# Patient Record
Sex: Female | Born: 1970
Health system: Southern US, Community
[De-identification: ages and names within clinical notes are randomized; demographics above are authoritative.]

## PROBLEM LIST (undated history)

## (undated) DIAGNOSIS — J45909 Unspecified asthma, uncomplicated: Secondary | ICD-10-CM

## (undated) DIAGNOSIS — M1990 Unspecified osteoarthritis, unspecified site: Secondary | ICD-10-CM

## (undated) DIAGNOSIS — L309 Dermatitis, unspecified: Secondary | ICD-10-CM

---

## 1998-01-09 ENCOUNTER — Ambulatory Visit (HOSPITAL_COMMUNITY): Admission: RE | Admit: 1998-01-09 | Discharge: 1998-01-09 | Payer: Self-pay

## 1998-04-25 ENCOUNTER — Other Ambulatory Visit: Admission: RE | Admit: 1998-04-25 | Discharge: 1998-04-25 | Payer: Self-pay | Admitting: Family Medicine

## 1999-10-04 ENCOUNTER — Other Ambulatory Visit: Admission: RE | Admit: 1999-10-04 | Discharge: 1999-10-04 | Payer: Self-pay | Admitting: Family Medicine

## 2001-09-08 ENCOUNTER — Encounter: Payer: Self-pay | Admitting: Family Medicine

## 2001-09-08 ENCOUNTER — Encounter: Admission: RE | Admit: 2001-09-08 | Discharge: 2001-09-08 | Payer: Self-pay | Admitting: Family Medicine

## 2002-05-09 ENCOUNTER — Ambulatory Visit (HOSPITAL_COMMUNITY): Admission: RE | Admit: 2002-05-09 | Discharge: 2002-05-09 | Payer: Self-pay | Admitting: Obstetrics

## 2002-05-09 ENCOUNTER — Encounter: Payer: Self-pay | Admitting: Obstetrics

## 2002-06-06 ENCOUNTER — Ambulatory Visit (HOSPITAL_COMMUNITY): Admission: RE | Admit: 2002-06-06 | Discharge: 2002-06-06 | Payer: Self-pay | Admitting: Obstetrics

## 2002-06-06 ENCOUNTER — Encounter: Payer: Self-pay | Admitting: Obstetrics

## 2002-08-18 ENCOUNTER — Inpatient Hospital Stay (HOSPITAL_COMMUNITY): Admission: AD | Admit: 2002-08-18 | Discharge: 2002-08-20 | Payer: Self-pay | Admitting: Obstetrics

## 2004-10-01 ENCOUNTER — Emergency Department (HOSPITAL_COMMUNITY): Admission: EM | Admit: 2004-10-01 | Discharge: 2004-10-01 | Payer: Self-pay | Admitting: Family Medicine

## 2006-02-09 ENCOUNTER — Emergency Department (HOSPITAL_COMMUNITY): Admission: EM | Admit: 2006-02-09 | Discharge: 2006-02-09 | Payer: Self-pay | Admitting: Family Medicine

## 2006-07-23 ENCOUNTER — Ambulatory Visit (HOSPITAL_COMMUNITY): Admission: RE | Admit: 2006-07-23 | Discharge: 2006-07-23 | Payer: Self-pay | Admitting: Obstetrics

## 2006-08-04 ENCOUNTER — Encounter: Admission: RE | Admit: 2006-08-04 | Discharge: 2006-08-04 | Payer: Self-pay | Admitting: Obstetrics

## 2009-08-31 ENCOUNTER — Encounter: Admission: RE | Admit: 2009-08-31 | Discharge: 2009-08-31 | Payer: Self-pay | Admitting: Obstetrics

## 2009-11-03 ENCOUNTER — Emergency Department (HOSPITAL_COMMUNITY): Admission: EM | Admit: 2009-11-03 | Discharge: 2009-11-03 | Payer: Self-pay | Admitting: Emergency Medicine

## 2010-05-26 ENCOUNTER — Encounter: Payer: Self-pay | Admitting: Obstetrics

## 2010-07-26 ENCOUNTER — Ambulatory Visit (INDEPENDENT_AMBULATORY_CARE_PROVIDER_SITE_OTHER): Payer: Medicaid Other

## 2010-07-26 ENCOUNTER — Inpatient Hospital Stay (INDEPENDENT_AMBULATORY_CARE_PROVIDER_SITE_OTHER)
Admission: RE | Admit: 2010-07-26 | Discharge: 2010-07-26 | Disposition: A | Discharge status: Home / Self Care | Payer: Self-pay | Source: Ambulatory Visit | Attending: Family Medicine | Admitting: Family Medicine

## 2010-07-26 DIAGNOSIS — S90129A Contusion of unspecified lesser toe(s) without damage to nail, initial encounter: Secondary | ICD-10-CM

## 2010-09-27 ENCOUNTER — Other Ambulatory Visit (HOSPITAL_COMMUNITY): Payer: Self-pay | Admitting: Obstetrics

## 2010-09-27 DIAGNOSIS — N83209 Unspecified ovarian cyst, unspecified side: Secondary | ICD-10-CM

## 2010-10-03 ENCOUNTER — Ambulatory Visit (HOSPITAL_COMMUNITY)
Admission: RE | Admit: 2010-10-03 | Discharge: 2010-10-03 | Disposition: A | Discharge status: Home / Self Care | Payer: Medicaid Other | Source: Ambulatory Visit | Attending: Obstetrics | Admitting: Obstetrics

## 2010-10-03 ENCOUNTER — Other Ambulatory Visit (HOSPITAL_COMMUNITY): Payer: Medicaid Other

## 2010-10-03 DIAGNOSIS — N831 Corpus luteum cyst of ovary, unspecified side: Secondary | ICD-10-CM | POA: Insufficient documentation

## 2010-10-03 DIAGNOSIS — N83209 Unspecified ovarian cyst, unspecified side: Secondary | ICD-10-CM

## 2010-10-03 DIAGNOSIS — D259 Leiomyoma of uterus, unspecified: Secondary | ICD-10-CM | POA: Insufficient documentation

## 2010-10-03 DIAGNOSIS — R1032 Left lower quadrant pain: Secondary | ICD-10-CM | POA: Insufficient documentation

## 2010-12-10 ENCOUNTER — Other Ambulatory Visit: Payer: Self-pay | Admitting: Obstetrics

## 2010-12-10 DIAGNOSIS — Z1231 Encounter for screening mammogram for malignant neoplasm of breast: Secondary | ICD-10-CM

## 2010-12-11 ENCOUNTER — Inpatient Hospital Stay (INDEPENDENT_AMBULATORY_CARE_PROVIDER_SITE_OTHER)
Admission: RE | Admit: 2010-12-11 | Discharge: 2010-12-11 | Disposition: A | Discharge status: Home / Self Care | Payer: Self-pay | Source: Ambulatory Visit | Attending: Family Medicine | Admitting: Family Medicine

## 2010-12-11 DIAGNOSIS — M799 Soft tissue disorder, unspecified: Secondary | ICD-10-CM

## 2010-12-12 ENCOUNTER — Emergency Department (HOSPITAL_COMMUNITY): Payer: No Typology Code available for payment source

## 2010-12-12 ENCOUNTER — Emergency Department (HOSPITAL_COMMUNITY)
Admission: EM | Admit: 2010-12-12 | Discharge: 2010-12-12 | Disposition: A | Discharge status: Home / Self Care | Payer: No Typology Code available for payment source | Attending: Emergency Medicine | Admitting: Emergency Medicine

## 2010-12-12 DIAGNOSIS — M25519 Pain in unspecified shoulder: Secondary | ICD-10-CM | POA: Insufficient documentation

## 2010-12-12 DIAGNOSIS — M546 Pain in thoracic spine: Secondary | ICD-10-CM | POA: Insufficient documentation

## 2010-12-12 DIAGNOSIS — M542 Cervicalgia: Secondary | ICD-10-CM | POA: Insufficient documentation

## 2010-12-12 DIAGNOSIS — S239XXA Sprain of unspecified parts of thorax, initial encounter: Secondary | ICD-10-CM | POA: Insufficient documentation

## 2010-12-12 DIAGNOSIS — F341 Dysthymic disorder: Secondary | ICD-10-CM | POA: Insufficient documentation

## 2010-12-12 DIAGNOSIS — S139XXA Sprain of joints and ligaments of unspecified parts of neck, initial encounter: Secondary | ICD-10-CM | POA: Insufficient documentation

## 2010-12-13 ENCOUNTER — Ambulatory Visit: Payer: Medicaid Other

## 2010-12-13 ENCOUNTER — Ambulatory Visit
Admission: RE | Admit: 2010-12-13 | Discharge: 2010-12-13 | Disposition: A | Discharge status: Home / Self Care | Payer: Medicaid Other | Source: Ambulatory Visit | Attending: Obstetrics | Admitting: Obstetrics

## 2010-12-13 DIAGNOSIS — Z1231 Encounter for screening mammogram for malignant neoplasm of breast: Secondary | ICD-10-CM

## 2011-07-25 ENCOUNTER — Encounter (HOSPITAL_COMMUNITY): Payer: Self-pay | Admitting: *Deleted

## 2011-07-25 ENCOUNTER — Emergency Department (INDEPENDENT_AMBULATORY_CARE_PROVIDER_SITE_OTHER)
Admission: EM | Admit: 2011-07-25 | Discharge: 2011-07-25 | Disposition: A | Discharge status: Home Health | Payer: Medicaid Other | Source: Home / Self Care | Attending: Family Medicine | Admitting: Family Medicine

## 2011-07-25 DIAGNOSIS — J45909 Unspecified asthma, uncomplicated: Secondary | ICD-10-CM

## 2011-07-25 DIAGNOSIS — J45901 Unspecified asthma with (acute) exacerbation: Secondary | ICD-10-CM

## 2011-07-25 HISTORY — DX: Unspecified osteoarthritis, unspecified site: M19.90

## 2011-07-25 MED ORDER — AMOXICILLIN-POT CLAVULANATE 875-125 MG PO TABS: 1.0000 | ORAL_TABLET | Freq: Two times a day (BID) | ORAL | Status: AC

## 2011-07-25 MED ORDER — HYDROCODONE-ACETAMINOPHEN 5-325 MG PO TABS: 1.0000 | ORAL_TABLET | Freq: Once | ORAL | Status: DC

## 2011-07-25 MED ORDER — HYDROCOD POLST-CHLORPHEN POLST 10-8 MG/5ML PO LQCR: 5.0000 mL | Freq: Two times a day (BID) | ORAL | Status: DC

## 2011-07-25 NOTE — ED Provider Notes (Signed)
History     CSN: [id number]  Arrival date & time 07/25/11  7829   First MD Initiated Contact with Patient 07/25/11 [phone]      No chief complaint on file.   (Consider location/radiation/quality/duration/timing/severity/associated sxs/prior treatment) Patient is a 41 y.o. female presenting with cough. The history is provided by the patient.  Cough This is a new problem. The current episode started more than 1 week ago. The problem occurs constantly. The problem has not changed since onset.The cough is productive of sputum. There has been no fever. Associated symptoms include rhinorrhea. Pertinent negatives include no shortness of breath and no wheezing. She is not a smoker (former smoker). Her past medical history is significant for bronchitis and asthma.    No past medical history on file.  No past surgical history on file.  No family history on file.  History  Substance Use Topics  . Smoking status: Not on file  . Smokeless tobacco: Not on file  . Alcohol Use: Not on file    OB History    No data available      Review of Systems  Constitutional: Positive for fatigue.  HENT: Positive for congestion, rhinorrhea and postnasal drip.   Respiratory: Positive for cough. Negative for shortness of breath and wheezing.   Cardiovascular: Negative.   Gastrointestinal: Negative.     Allergies  Review of patient's allergies indicates not on file.  Home Medications   Current Outpatient Rx  Name Route Sig Dispense Refill  . AMOXICILLIN-POT CLAVULANATE 875-125 MG PO TABS Oral Take 1 tablet by mouth 2 (two) times daily. 20 tablet 0  . HYDROCOD POLST-CPM POLST ER 10-8 MG/5ML PO LQCR Oral Take 5 mLs by mouth every 12 (twelve) hours. 115 mL 0    BP 127/81  Pulse 87  Temp(Src) 98.1 F (36.7 C) (Oral)  Resp 20  SpO2 96%  Physical Exam  Nursing note and vitals reviewed. Constitutional: She is oriented to person, place, and time. She appears well-developed and well-nourished.    HENT:  Head: Normocephalic.  Right Ear: External ear normal.  Left Ear: External ear normal.  Nose: Mucosal edema and rhinorrhea present.  Mouth/Throat: Oropharynx is clear and moist.  Neck: Normal range of motion. Neck supple.  Cardiovascular: Normal rate, regular rhythm, normal heart sounds and intact distal pulses.   Pulmonary/Chest: She has no wheezes. She has rhonchi.  Lymphadenopathy:    She has no cervical adenopathy.  Neurological: She is alert and oriented to person, place, and time.  Skin: Skin is warm and dry.  Psychiatric: She has a normal mood and affect.    ED Course  Procedures (including critical care time)  Labs Reviewed - No data to display No results found.   1. Acute asthmatic bronchitis       MDM          Linna Hoff, MD 07/25/11 [phone]

## 2011-07-25 NOTE — Discharge Instructions (Signed)
Take all of medicine, drink lots of fluids use mucinex daily, see your doctor if further problems

## 2011-07-25 NOTE — ED Notes (Signed)
Cold sxs the past week   Today she c/o productive cough, general aching,   She denies fever or sore throat

## 2011-08-06 ENCOUNTER — Telehealth (HOSPITAL_COMMUNITY): Payer: Self-pay | Admitting: *Deleted

## 2011-08-06 NOTE — ED Notes (Signed)
Pt. called and said her insurance did not cover the Hydrocod Polst-CPM Polst ER 10-8 mg./5 ml. and it cost $80.00.  She said [facility] has an equivalent Hydromet for $30.00, that she could afford.  I told her I would talk to the doctor and call her back. I called [facility] to confirm and the pharmacist said the Hycodan or Hydromet are $39.99 for 4 oz.. I thanked her. Discussed with Dr. Artis Flock and he said that was 12 days ago. Pt. should not need that now. She can use Robitussin. I called pt. and left message for her to call. Vassie Moselle 08/06/2011

## 2011-08-06 NOTE — ED Notes (Signed)
I called pt. and gave her the information from the previous note to use Robitussin. Pt. said that would be fine. Lisa Nash 08/06/2011

## 2011-10-21 ENCOUNTER — Encounter (HOSPITAL_COMMUNITY): Payer: Self-pay

## 2011-10-21 ENCOUNTER — Emergency Department (INDEPENDENT_AMBULATORY_CARE_PROVIDER_SITE_OTHER)
Admission: EM | Admit: 2011-10-21 | Discharge: 2011-10-21 | Disposition: A | Discharge status: Home / Self Care | Payer: Medicaid Other | Source: Home / Self Care

## 2011-10-21 DIAGNOSIS — L309 Dermatitis, unspecified: Secondary | ICD-10-CM | POA: Insufficient documentation

## 2011-10-21 DIAGNOSIS — R21 Rash and other nonspecific skin eruption: Secondary | ICD-10-CM

## 2011-10-21 DIAGNOSIS — L259 Unspecified contact dermatitis, unspecified cause: Secondary | ICD-10-CM

## 2011-10-21 DIAGNOSIS — L239 Allergic contact dermatitis, unspecified cause: Secondary | ICD-10-CM

## 2011-10-21 HISTORY — DX: Dermatitis, unspecified: L30.9

## 2011-10-21 HISTORY — DX: Unspecified asthma, uncomplicated: J45.909

## 2011-10-21 MED ORDER — TRIAMCINOLONE ACETONIDE 0.1 % EX CREA: TOPICAL_CREAM | Freq: Two times a day (BID) | CUTANEOUS | Status: AC

## 2011-10-21 NOTE — Discharge Instructions (Signed)

## 2011-10-21 NOTE — ED Provider Notes (Signed)
Medical screening examination/treatment/procedure(s) were performed by non-physician practitioner and as supervising physician I was immediately available for consultation/collaboration.  Leslee Home, M.D.   Reuben Likes, MD 10/21/11 2107

## 2011-10-21 NOTE — ED Notes (Signed)
Reportedly broke out in rash after using perfume spray about 10 days ago; rash started on hands, and has continued to erupt over both arms, lips feel swollen ( no dyspnea or dysphagia) NAD, but looks uncomfortable; used clobetasol cream  on rash w worsening syx

## 2011-10-21 NOTE — ED Provider Notes (Signed)
History     CSN: [id number]  Arrival date & time 10/21/11  1355   None     Chief Complaint  Patient presents with  . Skin Problem    (Consider location/radiation/quality/duration/timing/severity/associated sxs/prior treatment) The history is provided by the patient.  This patient complains of a RASH  Location: bilateral forearms  Onset: 1 wk ago   Course: unchanged Self-treated with: clobetasol             Improvement with treatment: no  History Itching: yes  Tenderness: no  New medications/antibiotics: no  Pet exposure: no  Recent travel or tropical exposure: no  New soaps, shampoos, detergent, clothing: no Tick/insect exposure: no   Reports accidentally spraying perfume onto skin, known allergen to perfume (she usually spray onto clothing).   Red Flags Feeling ill: {no Fever:no Facial/tongue swelling/difficulty breathing:  no  Diabetic or immunocompromised: no    Past Medical History  Diagnosis Date  . Arthritis   . Asthma   . Eczema     History reviewed. No pertinent past surgical history.  History reviewed. No pertinent family history.  History  Substance Use Topics  . Smoking status: Former Smoker    Quit date: 07/09/2005  . Smokeless tobacco: Not on file  . Alcohol Use: 0.6 oz/week    1 Glasses of wine per week    OB History    Grav Para Term Preterm Abortions TAB SAB Ect Mult Living                  Review of Systems  All other systems reviewed and are negative.    Allergies  Review of patient's allergies indicates no known allergies.  Home Medications   Current Outpatient Rx  Name Route Sig Dispense Refill  . ALBUTEROL SULFATE HFA 108 (90 BASE) MCG/ACT IN AERS Inhalation Inhale 2 puffs into the lungs every 6 (six) hours as needed.    Marland Kitchen HYDROCOD POLST-CPM POLST ER 10-8 MG/5ML PO LQCR Oral Take 5 mLs by mouth every 12 (twelve) hours. 115 mL 0  . IPRATROPIUM-ALBUTEROL 0.5-2.5 (3) MG/3ML IN SOLN Nebulization Take 3 mLs by  nebulization.    . TRIAMCINOLONE ACETONIDE 0.1 % EX CREA Topical Apply topically 2 (two) times daily. 30 g 0    BP 119/75  Pulse 80  Temp 97.3 F (36.3 C) (Oral)  Resp 16  SpO2 100%  LMP 09/26/2011  Physical Exam  Nursing note and vitals reviewed. Constitutional: She is oriented to person, place, and time. Vital signs are normal. She appears well-developed and well-nourished. She is active and cooperative.  HENT:  Head: Normocephalic.  Mouth/Throat: Uvula is midline, oropharynx is clear and moist and mucous membranes are normal.  Eyes: Conjunctivae are normal. Pupils are equal, round, and reactive to light. No scleral icterus.  Neck: Trachea normal. Neck supple.  Cardiovascular: Normal rate, regular rhythm and normal heart sounds.   Pulmonary/Chest: Effort normal and breath sounds normal.  Lymphadenopathy:    She has no cervical adenopathy.    She has no axillary adenopathy.  Neurological: She is alert and oriented to person, place, and time. No cranial nerve deficit or sensory deficit.  Skin: Skin is warm and dry.     Psychiatric: She has a normal mood and affect. Her speech is normal and behavior is normal. Judgment and thought content normal. Cognition and memory are normal.    ED Course  Procedures (including critical care time)  Labs Reviewed - No data to display No results found.  1. Rash/skin eruption   2. Allergic dermatitis       MDM  Cool showers; avoid heat, sunlight and anything that makes condition worse.  Began Benadryl for at least five to seven days.  Begin Medrol dosepak tomorrow-follow instructions.  RTC if symptoms do not improve or begin to have problems swallowing, breathing or significant change in condition.       Johnsie Kindred, NP 10/21/11 1622

## 2011-12-26 ENCOUNTER — Other Ambulatory Visit: Payer: Self-pay | Admitting: Obstetrics

## 2011-12-26 DIAGNOSIS — Z1231 Encounter for screening mammogram for malignant neoplasm of breast: Secondary | ICD-10-CM

## 2012-01-14 ENCOUNTER — Ambulatory Visit
Admission: RE | Admit: 2012-01-14 | Discharge: 2012-01-14 | Disposition: A | Discharge status: Home / Self Care | Payer: Medicaid Other | Source: Ambulatory Visit | Attending: Obstetrics | Admitting: Obstetrics

## 2012-01-14 DIAGNOSIS — Z1231 Encounter for screening mammogram for malignant neoplasm of breast: Secondary | ICD-10-CM

## 2012-01-23 ENCOUNTER — Ambulatory Visit: Payer: Medicaid Other | Attending: Sports Medicine | Admitting: Physical Therapy

## 2012-01-23 DIAGNOSIS — M25673 Stiffness of unspecified ankle, not elsewhere classified: Secondary | ICD-10-CM | POA: Insufficient documentation

## 2012-01-23 DIAGNOSIS — M25676 Stiffness of unspecified foot, not elsewhere classified: Secondary | ICD-10-CM | POA: Insufficient documentation

## 2012-01-23 DIAGNOSIS — IMO0001 Reserved for inherently not codable concepts without codable children: Secondary | ICD-10-CM | POA: Insufficient documentation

## 2012-01-23 DIAGNOSIS — R262 Difficulty in walking, not elsewhere classified: Secondary | ICD-10-CM | POA: Insufficient documentation

## 2012-01-23 DIAGNOSIS — M25579 Pain in unspecified ankle and joints of unspecified foot: Secondary | ICD-10-CM | POA: Insufficient documentation

## 2012-02-02 ENCOUNTER — Ambulatory Visit: Payer: Medicaid Other | Admitting: Physical Therapy

## 2012-02-03 ENCOUNTER — Encounter (HOSPITAL_COMMUNITY): Payer: Self-pay | Admitting: Emergency Medicine

## 2012-02-03 ENCOUNTER — Emergency Department (HOSPITAL_COMMUNITY)
Admission: EM | Admit: 2012-02-03 | Discharge: 2012-02-03 | Disposition: A | Discharge status: Home / Self Care | Payer: Medicaid Other | Source: Home / Self Care | Attending: Family Medicine | Admitting: Family Medicine

## 2012-02-03 DIAGNOSIS — L259 Unspecified contact dermatitis, unspecified cause: Secondary | ICD-10-CM

## 2012-02-03 DIAGNOSIS — L309 Dermatitis, unspecified: Secondary | ICD-10-CM

## 2012-02-03 MED ORDER — BETAMETHASONE DIPROPIONATE 0.05 % EX OINT: TOPICAL_OINTMENT | Freq: Every day | CUTANEOUS | Status: DC

## 2012-02-03 MED ORDER — METHYLPREDNISOLONE 4 MG PO KIT: PACK | ORAL | Status: DC

## 2012-02-03 NOTE — ED Notes (Signed)
Reports rash on hands, itching, weeping sores intermittently.  Was seen here, referred to dermatologist dr Terri Piedra.  Saw dr Terri Piedra.  Now unable to get another appt until a month from now, so patient came to [facility] today for help

## 2012-02-03 NOTE — ED Provider Notes (Signed)
History     CSN: [id number]  Arrival date & time 02/03/12  1051   First MD Initiated Contact with Patient 02/03/12 1137      Chief Complaint  Patient presents with  . Rash    (Consider location/radiation/quality/duration/timing/severity/associated sxs/prior treatment) Patient is a 41 y.o. female presenting with rash. The history is provided by the patient.  Rash  This is a chronic problem. The current episode started more than 1 week ago (seen in [place], then by dr lupton for hand rash,  not improving  and f/u appt not  for another mo.). The problem has been gradually worsening. There has been no fever. The rash is present on the right hand and left hand. The pain is moderate. Associated symptoms include blisters and weeping. The treatment provided mild relief.    Past Medical History  Diagnosis Date  . Arthritis   . Asthma   . Eczema     History reviewed. No pertinent past surgical history.  No family history on file.  History  Substance Use Topics  . Smoking status: Former Smoker    Quit date: 07/09/2005  . Smokeless tobacco: Not on file  . Alcohol Use: 0.6 oz/week    1 Glasses of wine per week    OB History    Grav Para Term Preterm Abortions TAB SAB Ect Mult Living                  Review of Systems  Constitutional: Negative.   Skin: Positive for rash.    Allergies  Review of patient's allergies indicates no known allergies.  Home Medications   Current Outpatient Rx  Name Route Sig Dispense Refill  . ALBUTEROL SULFATE HFA 108 (90 BASE) MCG/ACT IN AERS Inhalation Inhale 2 puffs into the lungs every 6 (six) hours as needed.    Marland Kitchen BETAMETHASONE DIPROPIONATE 0.05 % EX OINT Topical Apply topically daily. To hands only. Apply at bedtime. 50 g 0  . HYDROCOD POLST-CPM POLST ER 10-8 MG/5ML PO LQCR Oral Take 5 mLs by mouth every 12 (twelve) hours. 115 mL 0  . IPRATROPIUM-ALBUTEROL 0.5-2.5 (3) MG/3ML IN SOLN Nebulization Take 3 mLs by nebulization.    .  METHYLPREDNISOLONE 4 MG PO KIT  follow package directions, start on wed and take until finished 21 tablet 0  . TRIAMCINOLONE ACETONIDE 0.1 % EX CREA Topical Apply topically 2 (two) times daily. 30 g 0    BP 138/87  Pulse 67  Temp 98.8 F (37.1 C) (Oral)  Resp 18  SpO2 100%  LMP 01/16/2012  Physical Exam  Nursing note and vitals reviewed. Constitutional: She is oriented to person, place, and time. She appears well-developed and well-nourished.  Neurological: She is alert and oriented to person, place, and time.  Skin: Skin is warm and dry. Rash noted.       Fine blistering, dry cracking bilat hand dermatitis.extending up forearms.    ED Course  Procedures (including critical care time)  Labs Reviewed - No data to display No results found.   1. Eczematous dermatitis       MDM          Linna Hoff, MD 02/03/12 1206

## 2012-02-09 ENCOUNTER — Ambulatory Visit: Payer: Medicaid Other | Attending: Sports Medicine

## 2012-02-09 DIAGNOSIS — M25676 Stiffness of unspecified foot, not elsewhere classified: Secondary | ICD-10-CM | POA: Insufficient documentation

## 2012-02-09 DIAGNOSIS — M25579 Pain in unspecified ankle and joints of unspecified foot: Secondary | ICD-10-CM | POA: Insufficient documentation

## 2012-02-09 DIAGNOSIS — R262 Difficulty in walking, not elsewhere classified: Secondary | ICD-10-CM | POA: Insufficient documentation

## 2012-02-09 DIAGNOSIS — IMO0001 Reserved for inherently not codable concepts without codable children: Secondary | ICD-10-CM | POA: Insufficient documentation

## 2012-02-09 DIAGNOSIS — M25673 Stiffness of unspecified ankle, not elsewhere classified: Secondary | ICD-10-CM | POA: Insufficient documentation

## 2012-02-16 ENCOUNTER — Ambulatory Visit: Payer: Medicaid Other | Admitting: Physical Therapy

## 2012-10-15 ENCOUNTER — Other Ambulatory Visit (HOSPITAL_COMMUNITY): Payer: Self-pay | Admitting: Obstetrics

## 2012-10-15 DIAGNOSIS — D219 Benign neoplasm of connective and other soft tissue, unspecified: Secondary | ICD-10-CM

## 2012-10-21 ENCOUNTER — Ambulatory Visit (HOSPITAL_COMMUNITY): Admission: RE | Admit: 2012-10-21 | Payer: Medicaid Other | Source: Ambulatory Visit

## 2012-10-25 ENCOUNTER — Ambulatory Visit (HOSPITAL_COMMUNITY)
Admission: RE | Admit: 2012-10-25 | Discharge: 2012-10-25 | Disposition: A | Discharge status: Home / Self Care | Payer: Medicaid Other | Source: Ambulatory Visit | Attending: Obstetrics | Admitting: Obstetrics

## 2012-10-25 DIAGNOSIS — D252 Subserosal leiomyoma of uterus: Secondary | ICD-10-CM | POA: Insufficient documentation

## 2012-10-25 DIAGNOSIS — D219 Benign neoplasm of connective and other soft tissue, unspecified: Secondary | ICD-10-CM

## 2012-10-25 DIAGNOSIS — D251 Intramural leiomyoma of uterus: Secondary | ICD-10-CM | POA: Insufficient documentation

## 2013-01-06 ENCOUNTER — Other Ambulatory Visit: Payer: Self-pay

## 2013-01-06 DIAGNOSIS — Z1231 Encounter for screening mammogram for malignant neoplasm of breast: Secondary | ICD-10-CM

## 2013-01-21 ENCOUNTER — Ambulatory Visit
Admission: RE | Admit: 2013-01-21 | Discharge: 2013-01-21 | Disposition: A | Discharge status: Home / Self Care | Payer: Medicaid Other | Source: Ambulatory Visit

## 2013-01-21 DIAGNOSIS — Z1231 Encounter for screening mammogram for malignant neoplasm of breast: Secondary | ICD-10-CM

## 2013-05-29 ENCOUNTER — Encounter (HOSPITAL_COMMUNITY): Payer: Self-pay | Admitting: Emergency Medicine

## 2013-05-29 ENCOUNTER — Emergency Department (HOSPITAL_COMMUNITY)
Admission: EM | Admit: 2013-05-29 | Discharge: 2013-05-29 | Disposition: A | Discharge status: Home / Self Care | Payer: Medicaid Other | Attending: Emergency Medicine | Admitting: Emergency Medicine

## 2013-05-29 DIAGNOSIS — J45909 Unspecified asthma, uncomplicated: Secondary | ICD-10-CM | POA: Insufficient documentation

## 2013-05-29 DIAGNOSIS — Z872 Personal history of diseases of the skin and subcutaneous tissue: Secondary | ICD-10-CM | POA: Insufficient documentation

## 2013-05-29 DIAGNOSIS — Z87891 Personal history of nicotine dependence: Secondary | ICD-10-CM | POA: Insufficient documentation

## 2013-05-29 DIAGNOSIS — M779 Enthesopathy, unspecified: Secondary | ICD-10-CM

## 2013-05-29 DIAGNOSIS — M778 Other enthesopathies, not elsewhere classified: Secondary | ICD-10-CM

## 2013-05-29 DIAGNOSIS — Z8739 Personal history of other diseases of the musculoskeletal system and connective tissue: Secondary | ICD-10-CM | POA: Insufficient documentation

## 2013-05-29 DIAGNOSIS — M65839 Other synovitis and tenosynovitis, unspecified forearm: Secondary | ICD-10-CM | POA: Insufficient documentation

## 2013-05-29 DIAGNOSIS — M65849 Other synovitis and tenosynovitis, unspecified hand: Principal | ICD-10-CM

## 2013-05-29 NOTE — ED Notes (Signed)
Pt c/o right thumb pain X 2 weeks, worse when trying to pick something up. Denies injuring hand. Nad, skin warm and dry, resp e/u.

## 2013-05-29 NOTE — ED Provider Notes (Signed)
CSN: [id number]     Arrival date & time 05/29/13  2563 History   First MD Initiated Contact with Patient 05/29/13 [phone]     Chief Complaint  Patient presents with  . 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 She said it would get better over a couple weeks but has not resolved so she came to the ED for evaluation. She is mild to moderate pain worse with palpation worse with movement of her right thumb. Past Medical History  Diagnosis Date  . Arthritis   . Asthma   . Eczema    History reviewed. No pertinent past surgical history. No family history on file. History  Substance Use Topics  . Smoking status: Former Smoker    Quit date: 07/09/2005  . Smokeless tobacco: Not on file  . Alcohol Use: 0.6 oz/week    1 Glasses of wine per week   OB History   Grav Para Term Preterm Abortions TAB SAB Ect Mult Living                 Review of Systems 10 Systems reviewed and are negative for acute change except as noted in the HPI. Allergies  Review of patient's allergies indicates no known allergies.  Home Medications   Current Outpatient Rx  Name  Route  Sig  Dispense  Refill  . albuterol (PROVENTIL HFA;VENTOLIN HFA) 108 (90 BASE) MCG/ACT inhaler   Inhalation   Inhale 2 puffs into the lungs every 6 (six) hours as needed.         Marland Kitchen ibuprofen (ADVIL,MOTRIN) 100 MG tablet   Oral   Take 200 mg by mouth every 6 (six) hours as needed for pain or fever.          BP 115/81  Pulse 80  Temp(Src)  98.3 F (36.8 C) (Oral)  Resp 16  Ht 5\' 1"  (1.549 m)  SpO2 98% Physical Exam  Nursing note and vitals reviewed. Constitutional:  Awake, alert, nontoxic appearance.  HENT:  Head: Atraumatic.  Eyes: Right eye exhibits no discharge. Left eye exhibits no discharge.  Neck: Neck supple.  Pulmonary/Chest: Effort normal. She exhibits no tenderness.  Abdominal: Soft. There is no tenderness. There is no rebound.  Musculoskeletal: She exhibits tenderness.  Baseline ROM, no obvious new focal weakness. Left arm and both legs nontender. Right arm is nontender the shoulder upper arm elbow forearm and wrist. No anatomic snuffbox tenderness. Right hand is isolated tenderness at the base of the right thumb over the thenar eminence and extensor tendons of the first metacarpal region, radial pulses intact capillary refill is less than 2 seconds all digits right hand normal light touch was intact strength in the distributions of the median radial and ulnar nerve function she has pain with all right thumb movements including flexion extension abduction adduction without swelling erythema or excessive warmth. She has no joint pain with axial load to the right wrist.  Neurological:  Mental status and motor strength appears baseline for patient and situation.  Skin: No rash  noted.  Psychiatric: She has a normal mood and affect.    ED Course  Procedures (including critical care time) Patient informed of clinical course, understand medical decision-making process, and agree with plan. Labs Review Labs Reviewed - No data to display Imaging Review No results found.  EKG Interpretation   None       MDM   1. Tendinitis of right hand    I doubt any other EMC precluding discharge at this time including, but not necessarily limited to the following:CVA, radiculopathy, SBI.    Babette Relic, MD 05/29/13 2147

## 2013-05-29 NOTE — Discharge Instructions (Signed)
Your doctor has applied a Velcro splint to rest and protect your pain. Call or return immediately if you have: Increased pain.  Numbness, tingling, painful, cool fingers.  Swelling or inability to move fingers.  Color change to fingers (blue or gray).   Try over-the-counter ibuprofen as directed for 1-2 weeks.

## 2013-06-13 ENCOUNTER — Encounter (HOSPITAL_COMMUNITY): Payer: Self-pay | Admitting: Emergency Medicine

## 2013-06-13 ENCOUNTER — Emergency Department (HOSPITAL_COMMUNITY)
Admission: EM | Admit: 2013-06-13 | Discharge: 2013-06-13 | Disposition: A | Discharge status: Home / Self Care | Payer: Medicaid Other | Source: Home / Self Care | Attending: Family Medicine | Admitting: Family Medicine

## 2013-06-13 DIAGNOSIS — L309 Dermatitis, unspecified: Secondary | ICD-10-CM

## 2013-06-13 DIAGNOSIS — L259 Unspecified contact dermatitis, unspecified cause: Secondary | ICD-10-CM

## 2013-06-13 MED ORDER — TRIAMCINOLONE 0.1 % CREAM:EUCERIN CREAM 1:1: 1.0000 "application " | TOPICAL_CREAM | Freq: Two times a day (BID) | CUTANEOUS | Status: DC

## 2013-06-13 NOTE — ED Provider Notes (Signed)
Lisa Nash is a 43 y.o. female who presents to Urgent Care today for rash. Patient has had pruritic papules on her bilateral upper extremities worse or you have the past 3 days or so. No other members of her household have similar rash. She notes this is very itchy. She has tried baby oil which has not helped. She denies any scabies exposure. She denies any new medications detergents or soaps. She denies any flulike illness and feels well otherwise. No fevers or chills nausea vomiting or diarrhea. She does have a history of eczema.   Past Medical History  Diagnosis Date  . Arthritis   . Asthma   . Eczema    History  Substance Use Topics  . Smoking status: Former Smoker    Quit date: 07/09/2005  . Smokeless tobacco: Not on file  . Alcohol Use: 0.6 oz/week    1 Glasses of wine per week   ROS as above Medications: No current facility-administered medications for this encounter.   Current Outpatient Prescriptions  Medication Sig Dispense Refill  . albuterol (PROVENTIL HFA;VENTOLIN HFA) 108 (90 BASE) MCG/ACT inhaler Inhale 2 puffs into the lungs every 6 (six) hours as needed.      Marland Kitchen ibuprofen (ADVIL,MOTRIN) 100 MG tablet Take 200 mg by mouth every 6 (six) hours as needed for pain or fever.      . Triamcinolone Acetonide (TRIAMCINOLONE 0.1 % CREAM : EUCERIN) CREA Apply 1 application topically 2 (two) times daily. Disp 1 pound tub  1 each  2    Exam:  BP 136/85  Pulse 80  Temp(Src) 98 F (36.7 C) (Oral)  Resp 16  SpO2 100%  LMP 06/12/2013 Gen: Well NAD HEENT: EOMI,  MMM Lungs: Normal work of breathing. CTABL Heart: RRR no MRG Abd: NABS, Soft. NT, ND Exts: Brisk capillary refill, warm and well perfused.  Skin: Multiple excoriated papules upper extremities. None in the interdigital webspace or trunk.   Assessment and Plan: 43 y.o. female with dyshidrotic eczema. Plan to treat with triamcinolone cream compounded with Eucerin. Will also treat symptomatically with Gold Bond  Itch. If other members of the household developed similar rash will treat for scabies however the rash is not 100% characteristic of scabies currently.  Discussed warning signs or symptoms. Please see discharge instructions. Patient expresses understanding.    Gregor Hams, MD 06/13/13 [phone]

## 2013-06-13 NOTE — ED Notes (Signed)
C/o rash on both arms since Friday.  Area are red and irritated. Pt has tried otc creams with mild relief.   Denies any changes in soaps and new meds.

## 2013-06-13 NOTE — Discharge Instructions (Signed)
Thank you for coming in today. Used the cream twice daily until the skin starts looking normal.  Use moisturizer cream otherwise. You can use Gold Bond Itch for temporary control of itching. Come back as needed or if not getting better or if other members of the household develop a  similar rash.   Eczema Eczema, also called atopic dermatitis, is a skin disorder that causes inflammation of the skin. It causes a red rash and dry, scaly skin. The skin becomes very itchy. Eczema is generally worse during the cooler winter months and often improves with the warmth of summer. Eczema usually starts showing signs in infancy. Some children outgrow eczema, but it may last through adulthood.  CAUSES  The exact cause of eczema is not known, but it appears to run in families. People with eczema often have a family history of eczema, allergies, asthma, or hay fever. Eczema is not contagious. Flare-ups of the condition may be caused by:   Contact with something you are sensitive or allergic to.   Stress. SIGNS AND SYMPTOMS  Dry, scaly skin.   Red, itchy rash.   Itchiness. This may occur before the skin rash and may be very intense.  DIAGNOSIS  The diagnosis of eczema is usually made based on symptoms and medical history. TREATMENT  Eczema cannot be cured, but symptoms usually can be controlled with treatment and other strategies. A treatment plan might include:  Controlling the itching and scratching.   Use over-the-counter antihistamines as directed for itching. This is especially useful at night when the itching tends to be worse.   Use over-the-counter steroid creams as directed for itching.   Avoid scratching. Scratching makes the rash and itching worse. It may also result in a skin infection (impetigo) due to a break in the skin caused by scratching.   Keeping the skin well moisturized with creams every day. This will seal in moisture and help prevent dryness. Lotions that contain  alcohol and water should be avoided because they can dry the skin.   Limiting exposure to things that you are sensitive or allergic to (allergens).   Recognizing situations that cause stress.   Developing a plan to manage stress.  HOME CARE INSTRUCTIONS   Only take over-the-counter or prescription medicines as directed by your health care provider.   Do not use anything on the skin without checking with your health care provider.   Keep baths or showers short (5 minutes) in warm (not hot) water. Use mild cleansers for bathing. These should be unscented. You may add nonperfumed bath oil to the bath water. It is best to avoid soap and bubble bath.   Immediately after a bath or shower, when the skin is still damp, apply a moisturizing ointment to the entire body. This ointment should be a petroleum ointment. This will seal in moisture and help prevent dryness. The thicker the ointment, the better. These should be unscented.   Keep fingernails cut short. Children with eczema may need to wear soft gloves or mittens at night after applying an ointment.   Dress in clothes made of cotton or cotton blends. Dress lightly, because heat increases itching.   A child with eczema should stay away from anyone with fever blisters or cold sores. The virus that causes fever blisters (herpes simplex) can cause a serious skin infection in children with eczema. SEEK MEDICAL CARE IF:   Your itching interferes with sleep.   Your rash gets worse or is not better within 1  week after starting treatment.   You see pus or soft yellow scabs in the rash area.   You have a fever.   You have a rash flare-up after contact with someone who has fever blisters.  Document Released: 04/18/2000 Document Revised: 02/09/2013 Document Reviewed: 11/22/2012 [facility] Patient Information 2014 [employer].

## 2013-06-15 ENCOUNTER — Ambulatory Visit: Payer: Self-pay

## 2013-06-15 ENCOUNTER — Other Ambulatory Visit: Payer: Self-pay | Admitting: Occupational Medicine

## 2013-06-15 DIAGNOSIS — R52 Pain, unspecified: Secondary | ICD-10-CM

## 2014-01-19 ENCOUNTER — Other Ambulatory Visit: Payer: Self-pay

## 2014-01-19 DIAGNOSIS — Z1231 Encounter for screening mammogram for malignant neoplasm of breast: Secondary | ICD-10-CM

## 2014-01-27 ENCOUNTER — Encounter (INDEPENDENT_AMBULATORY_CARE_PROVIDER_SITE_OTHER): Payer: Self-pay

## 2014-01-27 ENCOUNTER — Ambulatory Visit
Admission: RE | Admit: 2014-01-27 | Discharge: 2014-01-27 | Disposition: A | Discharge status: Home / Self Care | Payer: Medicaid Other | Source: Ambulatory Visit

## 2014-01-27 DIAGNOSIS — Z1231 Encounter for screening mammogram for malignant neoplasm of breast: Secondary | ICD-10-CM

## 2014-02-09 ENCOUNTER — Encounter (HOSPITAL_COMMUNITY): Payer: Self-pay | Admitting: Emergency Medicine

## 2014-02-09 ENCOUNTER — Emergency Department (HOSPITAL_COMMUNITY)
Admission: EM | Admit: 2014-02-09 | Discharge: 2014-02-09 | Disposition: A | Discharge status: Home / Self Care | Payer: Medicaid Other | Source: Home / Self Care | Attending: Family Medicine | Admitting: Family Medicine

## 2014-02-09 DIAGNOSIS — J111 Influenza due to unidentified influenza virus with other respiratory manifestations: Secondary | ICD-10-CM

## 2014-02-09 DIAGNOSIS — J45901 Unspecified asthma with (acute) exacerbation: Secondary | ICD-10-CM

## 2014-02-09 MED ORDER — PREDNISONE 20 MG PO TABS
ORAL_TABLET | ORAL | Status: AC
  Filled 2014-02-09: qty 3

## 2014-02-09 MED ORDER — AEROCHAMBER PLUS FLO-VU MEDIUM MISC
1.0000 | Freq: Once | Status: AC
  Administered 2014-02-09: 1

## 2014-02-09 MED ORDER — ALBUTEROL SULFATE HFA 108 (90 BASE) MCG/ACT IN AERS
2.0000 | INHALATION_SPRAY | RESPIRATORY_TRACT | Status: DC | PRN
Start: 2014-02-09 — End: 2017-09-10

## 2014-02-09 MED ORDER — IBUPROFEN 800 MG PO TABS
800.0000 mg | ORAL_TABLET | Freq: Once | ORAL | Status: AC
  Administered 2014-02-09: 800 mg via ORAL

## 2014-02-09 MED ORDER — AEROCHAMBER PLUS FLO-VU MEDIUM MISC: 1.0000 | Freq: Once | Status: DC

## 2014-02-09 MED ORDER — AEROCHAMBER PLUS W/MASK MISC
Status: AC
  Filled 2014-02-09: qty 1

## 2014-02-09 MED ORDER — PREDNISONE 20 MG PO TABS: 50.0000 mg | ORAL_TABLET | Freq: Once | ORAL | Status: DC

## 2014-02-09 MED ORDER — IBUPROFEN 800 MG PO TABS
ORAL_TABLET | ORAL | Status: AC
Start: 2014-02-09 — End: 2014-02-09
  Filled 2014-02-09: qty 1

## 2014-02-09 MED ORDER — PREDNISONE 20 MG PO TABS
60.0000 mg | ORAL_TABLET | Freq: Once | ORAL | Status: AC
Start: 2014-02-09 — End: 2014-02-09
  Administered 2014-02-09: 60 mg via ORAL

## 2014-02-09 NOTE — Discharge Instructions (Signed)
You likely are getting over the flu This should go away in another 1-3 days Please start using your albuterol and spacer every 4 hours for the next 24 hours Please use ibuprofen 600mg  every 6 hours Stay well hydrated and get lots of rest Please come back if you get worse.    Asthma, Acute Bronchospasm Acute bronchospasm caused by asthma is also referred to as an asthma attack. Bronchospasm means your air passages become narrowed. The narrowing is caused by inflammation and tightening of the muscles in the air tubes (bronchi) in your lungs. This can make it hard to breathe or cause you to wheeze and cough. CAUSES Possible triggers are:  Animal dander from the skin, hair, or feathers of animals.  Dust mites contained in house dust.  Cockroaches.  Pollen from trees or grass.  Mold.  Cigarette or tobacco smoke.  Air pollutants such as dust, household cleaners, hair sprays, aerosol sprays, paint fumes, strong chemicals, or strong odors.  Cold air or weather changes. Cold air may trigger inflammation. Winds increase molds and pollens in the air.  Strong emotions such as crying or laughing hard.  Stress.  Certain medicines such as aspirin or beta-blockers.  Sulfites in foods and drinks, such as dried fruits and wine.  Infections or inflammatory conditions, such as a flu, cold, or inflammation of the nasal membranes (rhinitis).  Gastroesophageal reflux disease (GERD). GERD is a condition where stomach acid backs up into your esophagus.  Exercise or strenuous activity. SIGNS AND SYMPTOMS   Wheezing.  Excessive coughing, particularly at night.  Chest tightness.  Shortness of breath. DIAGNOSIS  Your health care provider will ask you about your medical history and perform a physical exam. A chest X-ray or blood testing may be performed to look for other causes of your symptoms or other conditions that may have triggered your asthma attack. TREATMENT  Treatment is aimed at  reducing inflammation and opening up the airways in your lungs. Most asthma attacks are treated with inhaled medicines. These include quick relief or rescue medicines (such as bronchodilators) and controller medicines (such as inhaled corticosteroids). These medicines are sometimes given through an inhaler or a nebulizer. Systemic steroid medicine taken by mouth or given through an IV tube also can be used to reduce the inflammation when an attack is moderate or severe. Antibiotic medicines are only used if a bacterial infection is present.  HOME CARE INSTRUCTIONS   Rest.  Drink plenty of liquids. This helps the mucus to remain thin and be easily coughed up. Only use caffeine in moderation and do not use alcohol until you have recovered from your illness.  Do not smoke. Avoid being exposed to secondhand smoke.  You play a critical role in keeping yourself in good health. Avoid exposure to things that cause you to wheeze or to have breathing problems.  Keep your medicines up-to-date and available. Carefully follow your health care provider's treatment plan.  Take your medicine exactly as prescribed.  When pollen or pollution is bad, keep windows closed and use an air conditioner or go to places with air conditioning.  Asthma requires careful medical care. See your health care provider for a follow-up as advised. If you are more than [redacted] weeks pregnant and you were prescribed any new medicines, let your obstetrician know about the visit and how you are doing. Follow up with your health care provider as directed.  After you have recovered from your asthma attack, make an appointment with your outpatient doctor  to talk about ways to reduce the likelihood of future attacks. If you do not have a doctor who manages your asthma, make an appointment with a primary care doctor to discuss your asthma. SEEK IMMEDIATE MEDICAL CARE IF:   You are getting worse.  You have trouble breathing. If severe, call  your local emergency services (911 in the [place]  You develop chest pain or discomfort.  You are vomiting.  You are not able to keep fluids down.  You are coughing up yellow, green, brown, or bloody sputum.  You have a fever and your symptoms suddenly get worse.  You have trouble swallowing. MAKE SURE YOU:   Understand these instructions.  Will watch your condition.  Will get help right away if you are not doing well or get worse. Document Released: 08/06/2006 Document Revised: 04/26/2013 Document Reviewed: 10/27/2012 [facility] Patient Information 2015 [place], [place]. This information is not intended to replace advice given to you by your health care provider. Make sure you discuss any questions you have with your health care provider.

## 2014-02-09 NOTE — ED Provider Notes (Signed)
CSN: [id number]     Arrival date & time 02/09/14  1045 History   First MD Initiated Contact with Patient 02/09/14 1138     Chief Complaint  Patient presents with  . Chest Pain  . Dizziness   (Consider location/radiation/quality/duration/timing/severity/associated sxs/prior Treatment) HPI  Body aches started 4 days ago. Biofreeze w/ mild improvement. Associated w/ mild nasal discharge and cough. Denies fevers, sore throat, rash, HA, CP, SOB. Improving slightly.   Wheezing. Started 4 days ago. Getting better overall. Using albuterol about BID w/ improvement.   Tolerating PO.    Past Medical History  Diagnosis Date  . Arthritis   . Asthma   . Eczema    History reviewed. No pertinent past surgical history. History reviewed. No pertinent family history. History  Substance Use Topics  . Smoking status: Former Smoker    Quit date: 07/09/2005  . Smokeless tobacco: Not on file  . Alcohol Use: 0.6 oz/week    1 Glasses of wine per week   OB History   Grav Para Term Preterm Abortions TAB SAB Ect Mult Living                 Review of Systems Per HPI with all other pertinent systems negative.   Allergies  Review of patient's allergies indicates no known allergies.  Home Medications   Prior to Admission medications   Medication Sig Start Date End Date Taking? Authorizing Provider  LISINOPRIL PO Take by mouth.   Yes Historical Provider, MD  albuterol (PROVENTIL HFA;VENTOLIN HFA) 108 (90 BASE) MCG/ACT inhaler Inhale 2 puffs into the lungs every 4 (four) hours as needed. 02/09/14   Waldemar Dickens, MD  ibuprofen (ADVIL,MOTRIN) 100 MG tablet Take 200 mg by mouth every 6 (six) hours as needed for pain or fever.    Historical Provider, MD  Spacer/Aero-Holding Chambers (AEROCHAMBER PLUS FLO-VU MEDIUM) MISC 1 each by Other route once. Use as directed 02/09/14   Waldemar Dickens, MD  Triamcinolone Acetonide (TRIAMCINOLONE 0.1 % CREAM : EUCERIN) CREA Apply 1 application topically 2 (two)  times daily. Disp 1 pound tub 06/13/13   Gregor Hams, MD   LMP 01/27/2014 Physical Exam  Constitutional: She is oriented to person, place, and time. She appears well-developed.  Lying on exam table feeling tired and cold  HENT:  Head: Normocephalic and atraumatic.  Eyes: EOM are normal. Pupils are equal, round, and reactive to light.  Neck: Normal range of motion.  Cardiovascular: Normal heart sounds.   No murmur heard. Pulmonary/Chest: Effort normal and breath sounds normal. No stridor. No respiratory distress. She has no wheezes. She has no rales. She exhibits no tenderness.  Abdominal: Soft.  Musculoskeletal: Normal range of motion. She exhibits no edema and no tenderness.  Neurological: She is alert and oriented to person, place, and time. No cranial nerve deficit. Coordination normal.  Skin: Skin is warm. No rash noted. She is not diaphoretic.  Psychiatric: She has a normal mood and affect. Judgment and thought content normal.    ED Course  Procedures (including critical care time) Labs Review Labs Reviewed - No data to display  Imaging Review No results found.   MDM   1. Asthma exacerbation   2. Flu syndrome    Likely recovering from flu. Outside the window of Tamiflu Fluids and rest Asthma. Would benefit from prednisone (60mg  PO given)  Refill on albutorol given and spacer written.   Precautions given and all questions answered  Linna Darner, MD Family Medicine  02/09/2014, 12:35 PM      Waldemar Dickens, MD 02/09/14 1235

## 2014-02-09 NOTE — ED Notes (Signed)
C/o  Chest pressure.  Fatigue.  Dizzy.  Nausea.  And headaches.   Denies fever, vomiting, diarrhea.  No cardiac hx.  Hx of hypertension.  Symptoms present since 9/26

## 2014-03-01 ENCOUNTER — Emergency Department (INDEPENDENT_AMBULATORY_CARE_PROVIDER_SITE_OTHER)
Admission: EM | Admit: 2014-03-01 | Discharge: 2014-03-01 | Disposition: A | Discharge status: Home / Self Care | Payer: PRIVATE HEALTH INSURANCE | Source: Home / Self Care | Attending: Family Medicine | Admitting: Family Medicine

## 2014-03-01 ENCOUNTER — Encounter (HOSPITAL_COMMUNITY): Payer: Self-pay | Admitting: Emergency Medicine

## 2014-03-01 ENCOUNTER — Emergency Department (HOSPITAL_COMMUNITY)
Admission: EM | Admit: 2014-03-01 | Discharge: 2014-03-01 | Disposition: A | Discharge status: Home / Self Care | Payer: Medicaid Other | Attending: Emergency Medicine | Admitting: Emergency Medicine

## 2014-03-01 DIAGNOSIS — Z872 Personal history of diseases of the skin and subcutaneous tissue: Secondary | ICD-10-CM | POA: Insufficient documentation

## 2014-03-01 DIAGNOSIS — S199XXA Unspecified injury of neck, initial encounter: Secondary | ICD-10-CM | POA: Diagnosis not present

## 2014-03-01 DIAGNOSIS — Z87891 Personal history of nicotine dependence: Secondary | ICD-10-CM | POA: Diagnosis not present

## 2014-03-01 DIAGNOSIS — Y92410 Unspecified street and highway as the place of occurrence of the external cause: Secondary | ICD-10-CM | POA: Insufficient documentation

## 2014-03-01 DIAGNOSIS — Z8739 Personal history of other diseases of the musculoskeletal system and connective tissue: Secondary | ICD-10-CM | POA: Insufficient documentation

## 2014-03-01 DIAGNOSIS — J45909 Unspecified asthma, uncomplicated: Secondary | ICD-10-CM | POA: Insufficient documentation

## 2014-03-01 DIAGNOSIS — Z79899 Other long term (current) drug therapy: Secondary | ICD-10-CM | POA: Insufficient documentation

## 2014-03-01 DIAGNOSIS — Z7952 Long term (current) use of systemic steroids: Secondary | ICD-10-CM | POA: Diagnosis not present

## 2014-03-01 DIAGNOSIS — M542 Cervicalgia: Secondary | ICD-10-CM

## 2014-03-01 DIAGNOSIS — Y9389 Activity, other specified: Secondary | ICD-10-CM | POA: Insufficient documentation

## 2014-03-01 MED ORDER — NAPROXEN 500 MG PO TABS: 500.0000 mg | ORAL_TABLET | Freq: Two times a day (BID) | ORAL | Status: DC

## 2014-03-01 MED ORDER — CYCLOBENZAPRINE HCL 10 MG PO TABS: 10.0000 mg | ORAL_TABLET | Freq: Two times a day (BID) | ORAL | Status: DC | PRN
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
## 2014-03-01 NOTE — Discharge Instructions (Signed)
Heat and medicines as prescribed, stay active, return as needed.

## 2014-03-01 NOTE — Discharge Instructions (Signed)
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
## 2014-03-03 NOTE — ED Provider Notes (Signed)
Medical screening examination/treatment/procedure(s) were performed by non-physician practitioner and as supervising physician I was immediately available for consultation/collaboration.   EKG Interpretation None       Babette Relic, MD 03/03/14 [phone]

## 2014-03-06 ENCOUNTER — Emergency Department (HOSPITAL_COMMUNITY): Payer: Medicaid Other

## 2014-03-06 ENCOUNTER — Encounter (HOSPITAL_COMMUNITY): Payer: Self-pay | Admitting: Emergency Medicine

## 2014-03-06 ENCOUNTER — Emergency Department (HOSPITAL_COMMUNITY)
Admission: EM | Admit: 2014-03-06 | Discharge: 2014-03-06 | Disposition: A | Discharge status: Home / Self Care | Payer: Medicaid Other | Attending: Emergency Medicine | Admitting: Emergency Medicine

## 2014-03-06 DIAGNOSIS — S4991XA Unspecified injury of right shoulder and upper arm, initial encounter: Secondary | ICD-10-CM | POA: Insufficient documentation

## 2014-03-06 DIAGNOSIS — Z79899 Other long term (current) drug therapy: Secondary | ICD-10-CM | POA: Insufficient documentation

## 2014-03-06 DIAGNOSIS — M1990 Unspecified osteoarthritis, unspecified site: Secondary | ICD-10-CM | POA: Diagnosis not present

## 2014-03-06 DIAGNOSIS — S199XXA Unspecified injury of neck, initial encounter: Secondary | ICD-10-CM | POA: Diagnosis not present

## 2014-03-06 DIAGNOSIS — Z872 Personal history of diseases of the skin and subcutaneous tissue: Secondary | ICD-10-CM | POA: Diagnosis not present

## 2014-03-06 DIAGNOSIS — Z7952 Long term (current) use of systemic steroids: Secondary | ICD-10-CM | POA: Insufficient documentation

## 2014-03-06 DIAGNOSIS — Y9389 Activity, other specified: Secondary | ICD-10-CM | POA: Insufficient documentation

## 2014-03-06 DIAGNOSIS — S3992XA Unspecified injury of lower back, initial encounter: Secondary | ICD-10-CM | POA: Insufficient documentation

## 2014-03-06 DIAGNOSIS — Z87891 Personal history of nicotine dependence: Secondary | ICD-10-CM | POA: Diagnosis not present

## 2014-03-06 DIAGNOSIS — Y92481 Parking lot as the place of occurrence of the external cause: Secondary | ICD-10-CM | POA: Diagnosis not present

## 2014-03-06 DIAGNOSIS — M542 Cervicalgia: Secondary | ICD-10-CM

## 2014-03-06 DIAGNOSIS — J45909 Unspecified asthma, uncomplicated: Secondary | ICD-10-CM | POA: Diagnosis not present

## 2014-03-06 MED ORDER — HYDROCODONE-ACETAMINOPHEN 5-325 MG PO TABS
2.0000 | ORAL_TABLET | Freq: Once | ORAL | Status: AC
  Administered 2014-03-06: 2 via ORAL
  Filled 2014-03-06: qty 2

## 2014-03-06 MED ORDER — METHOCARBAMOL 500 MG PO TABS: 500.0000 mg | ORAL_TABLET | Freq: Two times a day (BID) | ORAL | Status: DC
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
## 2014-03-06 NOTE — Discharge Instructions (Signed)
Return to the emergency room with worsening of symptoms, new symptoms or with symptoms that are concerning, especially fevers, chills, neck stiffness , especially fevers, loss of control of bladder or bowels, numbness or tingling around genital region or anus, weakness. RICE: Rest, Ice (three cycles of 20 mins on, 42mins off at least twice a day), compression/brace, elevation. Heating pad works well for back pain. Ibuprofen 400mg  (2 tablets 200mg ) every 5-6 hours for 3-5 days and then as needed for pain. Follow up with PCP if symptoms worsen or are persistent. Do not operate machinery, drive or drink alcohol while taking narcotics or muscle relaxers.    Cervical Sprain A cervical sprain is an injury in the neck in which the strong, fibrous tissues (ligaments) that connect your neck bones stretch or tear. Cervical sprains can range from mild to severe. Severe cervical sprains can cause the neck vertebrae to be unstable. This can lead to damage of the spinal cord and can result in serious nervous system problems. The amount of time it takes for a cervical sprain to get better depends on the cause and extent of the injury. Most cervical sprains heal in 1 to 3 weeks. CAUSES  Severe cervical sprains may be caused by:   Contact sport injuries (such as from football, rugby, wrestling, hockey, auto racing, gymnastics, diving, martial arts, or boxing).   Motor vehicle collisions.   Whiplash injuries. This is an injury from a sudden forward and backward whipping movement of the head and neck.  Falls.  Mild cervical sprains may be caused by:   Being in an awkward position, such as while cradling a telephone between your ear and shoulder.   Sitting in a chair that does not offer proper support.   Working at a poorly Landscape [employer] station.   Looking up or down for long periods of time.  SYMPTOMS   Pain, soreness, stiffness, or a burning sensation in the front, back, or sides of the  neck. This discomfort may develop immediately after the injury or slowly, 24 hours or more after the injury.   Pain or tenderness directly in the middle of the back of the neck.   Shoulder or upper back pain.   Limited ability to move the neck.   Headache.   Dizziness.   Weakness, numbness, or tingling in the hands or arms.   Muscle spasms.   Difficulty swallowing or chewing.   Tenderness and swelling of the neck.  DIAGNOSIS  Most of the time your health care provider can diagnose a cervical sprain by taking your history and doing a physical exam. Your health care provider will ask about previous neck injuries and any known neck problems, such as arthritis in the neck. X-rays may be taken to find out if there are any other problems, such as with the bones of the neck. Other tests, such as a CT scan or MRI, may also be needed.  TREATMENT  Treatment depends on the severity of the cervical sprain. Mild sprains can be treated with rest, keeping the neck in place (immobilization), and pain medicines. Severe cervical sprains are immediately immobilized. Further treatment is done to help with pain, muscle spasms, and other symptoms and may include:  Medicines, such as pain relievers, numbing medicines, or muscle relaxants.   Physical therapy. This may involve stretching exercises, strengthening exercises, and posture training. Exercises and improved posture can help stabilize the neck, strengthen muscles, and help stop symptoms from returning.  HOME CARE INSTRUCTIONS   Put  ice on the injured area.   Put ice in a plastic bag.   Place a towel between your skin and the bag.   Leave the ice on for 15-20 minutes, 3-4 times a day.   If your injury was severe, you may have been given a cervical collar to wear. A cervical collar is a two-piece collar designed to keep your neck from moving while it heals.  Do not remove the collar unless instructed by your health care  provider.  If you have long hair, keep it outside of the collar.  Ask your health care provider before making any adjustments to your collar. Minor adjustments may be required over time to improve comfort and reduce pressure on your chin or on the back of your head.  Ifyou are allowed to remove the collar for cleaning or bathing, follow your health care provider's instructions on how to do so safely.  Keep your collar clean by wiping it with mild soap and water and drying it completely. If the collar you have been given includes removable pads, remove them every 1-2 days and hand wash them with soap and water. Allow them to air dry. They should be completely dry before you wear them in the collar.  If you are allowed to remove the collar for cleaning and bathing, wash and dry the skin of your neck. Check your skin for irritation or sores. If you see any, tell your health care provider.  Do not drive while wearing the collar.   Only take over-the-counter or prescription medicines for pain, discomfort, or fever as directed by your health care provider.   Keep all follow-up appointments as directed by your health care provider.   Keep all physical therapy appointments as directed by your health care provider.   Make any needed adjustments to your workstation to promote good posture.   Avoid positions and activities that make your symptoms worse.   Warm up and stretch before being active to help prevent problems.  SEEK MEDICAL CARE IF:   Your pain is not controlled with medicine.   You are unable to decrease your pain medicine over time as planned.   Your activity level is not improving as expected.  SEEK IMMEDIATE MEDICAL CARE IF:   You develop any bleeding.  You develop stomach upset.  You have signs of an allergic reaction to your medicine.   Your symptoms get worse.   You develop new, unexplained symptoms.   You have numbness, tingling, weakness, or paralysis  in any part of your body.  MAKE SURE YOU:   Understand these instructions.  Will watch your condition.  Will get help right away if you are not doing well or get worse. Document Released: 02/16/2007 Document Revised: 04/26/2013 Document Reviewed: 10/27/2012 [facility] Patient Information 2015 [place], [place]. This information is not intended to replace advice given to you by your health care provider. Make sure you discuss any questions you have with your health care provider.
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
## 2014-03-06 NOTE — ED Notes (Signed)
Pt A&OX4, ambulatory at d/c with steady gait, NAD 

## 2014-03-08 ENCOUNTER — Telehealth: Payer: Self-pay | Admitting: Hematology and Oncology

## 2014-03-08 NOTE — Telephone Encounter (Signed)
LEFT MESSAGE FOR PATIENT AND GAVE NP APPT FOR 11/20 @ 1:30 W/DR. Bostic. CONTACT INFORMATION LEFT FOR PATIENT TO RETURN CALL TO CONFIRM MESSAGE/NP APPT.

## 2014-03-24 ENCOUNTER — Encounter: Payer: Self-pay | Admitting: Hematology and Oncology

## 2014-03-24 ENCOUNTER — Encounter (INDEPENDENT_AMBULATORY_CARE_PROVIDER_SITE_OTHER): Payer: Self-pay

## 2014-03-24 ENCOUNTER — Ambulatory Visit (HOSPITAL_BASED_OUTPATIENT_CLINIC_OR_DEPARTMENT_OTHER): Payer: Medicaid Other | Admitting: Hematology and Oncology

## 2014-03-24 ENCOUNTER — Ambulatory Visit: Payer: Medicaid Other

## 2014-03-24 VITALS — BP 136/79 | HR 93 | Temp 97.9°F | Resp 16 | Wt 129.7 lb

## 2014-03-24 DIAGNOSIS — R233 Spontaneous ecchymoses: Secondary | ICD-10-CM

## 2014-03-24 DIAGNOSIS — T148XXA Other injury of unspecified body region, initial encounter: Secondary | ICD-10-CM

## 2014-03-24 NOTE — Progress Notes (Signed)
Checked in new patient with no financial issues prior to seeing the dr. She has appt card and has not been traveling.

## 2014-03-25 DIAGNOSIS — T148XXA Other injury of unspecified body region, initial encounter: Secondary | ICD-10-CM | POA: Insufficient documentation

## 2014-03-25 NOTE — Progress Notes (Signed)
[place] NOTE  Patient Care Team: Frederico Hamman, MD as PCP - General (Obstetrics and Gynecology)  CHIEF COMPLAINTS/PURPOSE OF CONSULTATION:  Easy bruiring  HISTORY OF PRESENTING ILLNESS:  Lisa Nash 43 y.o. female is here because of history of easy bruising. She has noticed bruising in her legs recently. The patient denies any recent signs or symptoms of bleeding such as spontaneous epistaxis, hematuria or hematochezia. She denies menorrhagia. She takes regular NSAIDS for joint pains and premenstrual cramps. Had history of teeth extractions without major bleeding. She has never suffered from bleeding into joints or bleeding requiring transfusions  MEDICAL HISTORY:  Past Medical History  Diagnosis Date  . Arthritis   . Asthma   . Eczema     SURGICAL HISTORY: History reviewed. No pertinent past surgical history.  SOCIAL HISTORY: History   Social History  . Marital Status: Single    Spouse Name: N/A    Number of Children: N/A  . Years of Education: N/A   Occupational History  . Not on file.   Social History Main Topics  . Smoking status: Former Smoker    Quit date: 07/09/2005  . Smokeless tobacco: Never Used  . Alcohol Use: 0.6 oz/week    1 Glasses of wine per week  . Drug Use: No  . Sexual Activity: No   Other Topics Concern  . Not on file   Social History Narrative    FAMILY HISTORY: History reviewed. No pertinent family history.  ALLERGIES:  has No Known Allergies.  MEDICATIONS:  Current Outpatient Prescriptions  Medication Sig Dispense Refill  . albuterol (PROVENTIL HFA;VENTOLIN HFA) 108 (90 BASE) MCG/ACT inhaler Inhale 2 puffs into the lungs every 4 (four) hours as needed. 1 Inhaler 0  . doxycycline (VIBRA-TABS) 100 MG tablet Take 100 mg by mouth 2 (two) times daily.  0  . ibuprofen (ADVIL,MOTRIN) 100 MG tablet Take 200 mg by mouth every 6 (six) hours as needed for pain or fever.    . methocarbamol (ROBAXIN)  500 MG tablet Take 1 tablet (500 mg total) by mouth 2 (two) times daily. 20 tablet 0  . naproxen (NAPROSYN) 500 MG tablet Take 1 tablet (500 mg total) by mouth 2 (two) times daily. 20 tablet 0  . QVAR 80 MCG/ACT inhaler Inhale 2 puffs into the lungs 2 (two) times daily.  0  . Spacer/Aero-Holding Chambers (AEROCHAMBER PLUS FLO-VU MEDIUM) MISC 1 each by Other route once. Use as directed 1 each 1  . BROMFED DM 30-2-10 MG/5ML syrup   0  . cyclobenzaprine (FLEXERIL) 10 MG tablet Take 1 tablet (10 mg total) by mouth 2 (two) times daily as needed for muscle spasms. 20 tablet 0  . LISINOPRIL PO Take by mouth.    . Triamcinolone Acetonide (TRIAMCINOLONE 0.1 % CREAM : EUCERIN) CREA Apply 1 application topically 2 (two) times daily. Disp 1 pound tub 1 each 2   No current facility-administered medications for this visit.    REVIEW OF SYSTEMS:   Constitutional: Denies fevers, chills or abnormal night sweats Eyes: Denies blurriness of vision, double vision or watery eyes Ears, nose, mouth, throat, and face: Denies mucositis or sore throat Respiratory: Denies cough, dyspnea or wheezes Cardiovascular: Denies palpitation, chest discomfort or lower extremity swelling Gastrointestinal:  Denies nausea, heartburn or change in bowel habits Skin: Denies abnormal skin rashes Lymphatics: Denies new lymphadenopathy or easy bruising Neurological:Denies numbness, tingling or new weaknesses Behavioral/Psych: Mood is stable, no new changes  All other systems were  reviewed with the patient and are negative.  PHYSICAL EXAMINATION: ECOG PERFORMANCE STATUS: 0 - Asymptomatic  Filed Vitals:   03/24/14 1344  BP: 136/79  Pulse: 93  Temp: 97.9 F (36.6 C)  Resp: 16   Filed Weights   03/24/14 1344  Weight: 129 lb 11.2 oz (58.832 kg)    GENERAL:alert, no distress and comfortable SKIN: skin color, texture, turgor are normal, no rashes or significant lesions EYES: normal, conjunctiva are pink and non-injected,  sclera clear OROPHARYNX:no exudate, no erythema and lips, buccal mucosa, and tongue normal  NECK: supple, thyroid normal size, non-tender, without nodularity LYMPH:  no palpable lymphadenopathy in the cervical, axillary or inguinal LUNGS: clear to auscultation and percussion with normal breathing effort HEART: regular rate & rhythm and no murmurs and no lower extremity edema ABDOMEN:abdomen soft, non-tender and normal bowel sounds Musculoskeletal:no cyanosis of digits and no clubbing  PSYCH: alert & oriented x 3 with fluent speech NEURO: no focal motor/sensory deficits ASSESSMENT & PLAN:  Bruising This is likely related to frequent use of NSAIDS. She has no history of spontaneous bleeding. I would not recommend further work-up. She is reassured.      All questions were answered. The patient knows to call the clinic with any problems, questions or concerns. I spent 30 minutes counseling the patient face to face. The total time spent in the appointment was 40 minutes and more than 50% was on counseling.     [facility], [place], MD 03/25/2014 2:34 PM

## 2014-03-25 NOTE — Assessment & Plan Note (Signed)
This is likely related to frequent use of NSAIDS. She has no history of spontaneous bleeding. I would not recommend further work-up. She is reassured.

## 2014-03-27 ENCOUNTER — Encounter: Payer: Self-pay | Admitting: *Deleted

## 2014-03-27 ENCOUNTER — Telehealth: Payer: Self-pay | Admitting: *Deleted

## 2014-03-27 NOTE — Telephone Encounter (Signed)
Presents to office to request a letter that she was here for appointment on 03/24/14 for her work and school. Made her aware that she will be called when her letters are ready.

## 2014-03-27 NOTE — Telephone Encounter (Signed)
Made patient aware that her letters are ready for pick up.

## 2014-07-23 ENCOUNTER — Encounter (HOSPITAL_COMMUNITY): Payer: Self-pay

## 2014-07-23 ENCOUNTER — Emergency Department (HOSPITAL_COMMUNITY)
Admission: EM | Admit: 2014-07-23 | Discharge: 2014-07-24 | Disposition: A | Discharge status: Home / Self Care | Payer: Medicaid Other | Attending: Emergency Medicine | Admitting: Emergency Medicine

## 2014-07-23 ENCOUNTER — Emergency Department (HOSPITAL_COMMUNITY): Payer: Medicaid Other

## 2014-07-23 DIAGNOSIS — J45909 Unspecified asthma, uncomplicated: Secondary | ICD-10-CM | POA: Diagnosis not present

## 2014-07-23 DIAGNOSIS — Y9389 Activity, other specified: Secondary | ICD-10-CM | POA: Insufficient documentation

## 2014-07-23 DIAGNOSIS — M1990 Unspecified osteoarthritis, unspecified site: Secondary | ICD-10-CM | POA: Diagnosis not present

## 2014-07-23 DIAGNOSIS — Y998 Other external cause status: Secondary | ICD-10-CM | POA: Diagnosis not present

## 2014-07-23 DIAGNOSIS — Z791 Long term (current) use of non-steroidal anti-inflammatories (NSAID): Secondary | ICD-10-CM | POA: Insufficient documentation

## 2014-07-23 DIAGNOSIS — Z3202 Encounter for pregnancy test, result negative: Secondary | ICD-10-CM | POA: Diagnosis not present

## 2014-07-23 DIAGNOSIS — S20211A Contusion of right front wall of thorax, initial encounter: Secondary | ICD-10-CM

## 2014-07-23 DIAGNOSIS — Z872 Personal history of diseases of the skin and subcutaneous tissue: Secondary | ICD-10-CM | POA: Insufficient documentation

## 2014-07-23 DIAGNOSIS — S299XXA Unspecified injury of thorax, initial encounter: Secondary | ICD-10-CM | POA: Diagnosis present

## 2014-07-23 DIAGNOSIS — R05 Cough: Secondary | ICD-10-CM | POA: Diagnosis not present

## 2014-07-23 DIAGNOSIS — Z7951 Long term (current) use of inhaled steroids: Secondary | ICD-10-CM | POA: Diagnosis not present

## 2014-07-23 DIAGNOSIS — Z87891 Personal history of nicotine dependence: Secondary | ICD-10-CM | POA: Insufficient documentation

## 2014-07-23 DIAGNOSIS — W010XXA Fall on same level from slipping, tripping and stumbling without subsequent striking against object, initial encounter: Secondary | ICD-10-CM | POA: Diagnosis not present

## 2014-07-23 DIAGNOSIS — W19XXXA Unspecified fall, initial encounter: Secondary | ICD-10-CM

## 2014-07-23 DIAGNOSIS — Z79899 Other long term (current) drug therapy: Secondary | ICD-10-CM | POA: Diagnosis not present

## 2014-07-23 DIAGNOSIS — Y929 Unspecified place or not applicable: Secondary | ICD-10-CM | POA: Insufficient documentation

## 2014-07-23 DIAGNOSIS — Z792 Long term (current) use of antibiotics: Secondary | ICD-10-CM | POA: Diagnosis not present

## 2014-07-23 NOTE — ED Notes (Signed)
Pt. Reports that she fell Friday night at home and landed on her right side.

## 2014-07-23 NOTE — ED Provider Notes (Signed)
CSN: [id number]     Arrival date & time 07/23/14  2216 History  This chart was scribed for Lisa Berninger, MD by Girtha Hake, ED Scribe. The patient was seen in D30C/D30C. The patient's care was started at 11:52 PM.     Chief Complaint  Patient presents with  . Cough   Patient is a 44 y.o. female presenting with fall. The history is provided by the patient. No language interpreter was used.  Fall This is a new problem. The current episode started 2 days ago. The problem has not changed since onset.Pertinent negatives include no chest pain and no shortness of breath. Associated symptoms comments: Cough. Nothing aggravates the symptoms. Nothing relieves the symptoms. She has tried a warm compress for the symptoms. The treatment provided no relief.   HPI Comments: Lisa Nash is a 44 y.o. female who presents to the Emergency Department complaining of a fall that occurred two days ago. Patient reports that she tripped on a rug and fell onto a carpeted floor. Patient reports that she hit her ribs on the right side when she fell. She reports associated pain that she characterizes as a 7/10 in severity. She has taken ibuprofen for the pain with no relief. She reports that her last dose was approximately 8 hours ago. Patient also complain of a cough that began after she fell. She reports mild associated wheezing. She has used her Albuterol inhaler several times with minimal relief. Patient has a history of asthma and smoking. She states that her cough is not worse at any particular time of the day.   PCP is Dr. Ruthann Cancer.   Past Medical History  Diagnosis Date  . Arthritis   . Asthma   . Eczema    History reviewed. No pertinent past surgical history. History reviewed. No pertinent family history. History  Substance Use Topics  . Smoking status: Former Smoker    Quit date: 07/09/2005  . Smokeless tobacco: Never Used  . Alcohol Use: 0.6 oz/week    1 Glasses of wine per week   OB  History    No data available     Review of Systems  Respiratory: Positive for cough. Negative for shortness of breath, wheezing and stridor.   Cardiovascular: Negative for chest pain, palpitations and leg swelling.  All other systems reviewed and are negative.     Allergies  Review of patient's allergies indicates no known allergies.  Home Medications   Prior to Admission medications   Medication Sig Start Date End Date Taking? Authorizing Provider  albuterol (PROVENTIL HFA;VENTOLIN HFA) 108 (90 BASE) MCG/ACT inhaler Inhale 2 puffs into the lungs every 4 (four) hours as needed. 02/09/14   Waldemar Dickens, MD  BROMFED DM 30-2-10 MG/5ML syrup  03/14/14   Historical Provider, MD  cyclobenzaprine (FLEXERIL) 10 MG tablet Take 1 tablet (10 mg total) by mouth 2 (two) times daily as needed for muscle spasms. 03/01/14   Etta Quill, NP  doxycycline (VIBRA-TABS) 100 MG tablet Take 100 mg by mouth 2 (two) times daily. 02/21/14   Historical Provider, MD  ibuprofen (ADVIL,MOTRIN) 100 MG tablet Take 200 mg by mouth every 6 (six) hours as needed for pain or fever.    Historical Provider, MD  LISINOPRIL PO Take by mouth.    Historical Provider, MD  methocarbamol (ROBAXIN) 500 MG tablet Take 1 tablet (500 mg total) by mouth 2 (two) times daily. 03/06/14   Al Corpus, PA-C  naproxen (NAPROSYN) 500 MG tablet Take 1  tablet (500 mg total) by mouth 2 (two) times daily. 03/01/14   Etta Quill, NP  QVAR 80 MCG/ACT inhaler Inhale 2 puffs into the lungs 2 (two) times daily. 02/21/14   Historical Provider, MD  Spacer/Aero-Holding Chambers (AEROCHAMBER PLUS FLO-VU MEDIUM) MISC 1 each by Other route once. Use as directed 02/09/14   Waldemar Dickens, MD  Triamcinolone Acetonide (TRIAMCINOLONE 0.1 % CREAM : EUCERIN) CREA Apply 1 application topically 2 (two) times daily. Disp 1 pound tub 06/13/13   Gregor Hams, MD   Triage Vitals: BP 143/87 mmHg  Pulse 91  Temp(Src) 98.7 F (37.1 C) (Oral)  Resp 18  SpO2 100%   LMP 07/14/2014 Physical Exam  Constitutional: She is oriented to person, place, and time. She appears well-developed and well-nourished. No distress.  HENT:  Head: Normocephalic and atraumatic.  Mouth/Throat: Oropharynx is clear and moist.  Eyes: Conjunctivae and EOM are normal. Pupils are equal, round, and reactive to light.  Neck: Neck supple. No tracheal deviation present.  No stridor.  Cardiovascular: Normal rate and regular rhythm.   Pulmonary/Chest: Effort normal and breath sounds normal. No respiratory distress. She has no wheezes. She has no rales. She exhibits tenderness.  Abdominal: Soft. Bowel sounds are normal. There is no tenderness. There is no rebound and no guarding.  Musculoskeletal: Normal range of motion.  No crepitus over the ribs. No hematoma.  Neurological: She is alert and oriented to person, place, and time.  Skin: Skin is warm and dry.  Psychiatric: She has a normal mood and affect. Her behavior is normal.  Nursing note and vitals reviewed.   ED Course  Procedures (including critical care time) DIAGNOSTIC STUDIES: Oxygen Saturation is 98% on room air, normal by my interpretation.    COORDINATION OF CARE:    Labs Review Labs Reviewed  POC URINE PREG, ED    Imaging Review No results found.   EKG Interpretation None      MDM   Final diagnoses:  Fall    Rib contusion incentive spirometer and pain medication and muscle relaxants with close follow up  I personally performed the services described in this documentation, which was scribed in my presence. The recorded information has been reviewed and is accurate.     Veatrice Kells, MD 07/24/14 [phone]

## 2014-07-23 NOTE — ED Notes (Signed)
Pt reports onset Friday night productive cough- yellow-green phelgm, voice hoarse.  Cough woke pt up.  Coughing every hour.  Right sided chest pain when coughing.  Pt seen PCP 07-18-14, was prescribed Claritin.  No fever.  Has used Albuterol inhaler several times, has not done nebulizer treatment.

## 2014-07-24 ENCOUNTER — Encounter (HOSPITAL_COMMUNITY): Payer: Self-pay | Admitting: Emergency Medicine

## 2014-07-24 LAB — POC URINE PREG, ED: Preg Test, Ur: NEGATIVE

## 2014-07-24 MED ORDER — KETOROLAC TROMETHAMINE 60 MG/2ML IM SOLN
60.0000 mg | Freq: Once | INTRAMUSCULAR | Status: AC
  Administered 2014-07-24: 60 mg via INTRAMUSCULAR
  Filled 2014-07-24: qty 2

## 2014-07-24 MED ORDER — METHOCARBAMOL 500 MG PO TABS
1000.0000 mg | ORAL_TABLET | Freq: Once | ORAL | Status: AC
  Administered 2014-07-24: 1000 mg via ORAL
  Filled 2014-07-24: qty 2

## 2014-07-24 MED ORDER — TRAMADOL HCL 50 MG PO TABS: 50.0000 mg | ORAL_TABLET | Freq: Four times a day (QID) | ORAL | Status: DC | PRN

## 2014-07-24 MED ORDER — METHOCARBAMOL 500 MG PO TABS: 500.0000 mg | ORAL_TABLET | Freq: Two times a day (BID) | ORAL | Status: DC

## 2014-07-24 NOTE — ED Notes (Signed)
Pt. Left with all belongings and refused wheelchair 

## 2014-07-24 NOTE — Discharge Instructions (Signed)

## 2014-07-26 ENCOUNTER — Emergency Department (HOSPITAL_COMMUNITY): Payer: Medicaid Other

## 2014-07-26 ENCOUNTER — Encounter (HOSPITAL_COMMUNITY): Payer: Self-pay | Admitting: Emergency Medicine

## 2014-07-26 ENCOUNTER — Emergency Department (HOSPITAL_COMMUNITY)
Admission: EM | Admit: 2014-07-26 | Discharge: 2014-07-27 | Disposition: A | Discharge status: Home / Self Care | Payer: Medicaid Other | Attending: Emergency Medicine | Admitting: Emergency Medicine

## 2014-07-26 DIAGNOSIS — R000 Tachycardia, unspecified: Secondary | ICD-10-CM | POA: Insufficient documentation

## 2014-07-26 DIAGNOSIS — Z87891 Personal history of nicotine dependence: Secondary | ICD-10-CM | POA: Diagnosis not present

## 2014-07-26 DIAGNOSIS — R05 Cough: Secondary | ICD-10-CM | POA: Insufficient documentation

## 2014-07-26 DIAGNOSIS — M1990 Unspecified osteoarthritis, unspecified site: Secondary | ICD-10-CM | POA: Insufficient documentation

## 2014-07-26 DIAGNOSIS — Z792 Long term (current) use of antibiotics: Secondary | ICD-10-CM | POA: Diagnosis not present

## 2014-07-26 DIAGNOSIS — R0781 Pleurodynia: Secondary | ICD-10-CM

## 2014-07-26 DIAGNOSIS — R079 Chest pain, unspecified: Secondary | ICD-10-CM | POA: Diagnosis present

## 2014-07-26 DIAGNOSIS — R059 Cough, unspecified: Secondary | ICD-10-CM

## 2014-07-26 DIAGNOSIS — Z791 Long term (current) use of non-steroidal anti-inflammatories (NSAID): Secondary | ICD-10-CM | POA: Insufficient documentation

## 2014-07-26 DIAGNOSIS — R0602 Shortness of breath: Secondary | ICD-10-CM

## 2014-07-26 DIAGNOSIS — Z79899 Other long term (current) drug therapy: Secondary | ICD-10-CM | POA: Diagnosis not present

## 2014-07-26 DIAGNOSIS — R0789 Other chest pain: Secondary | ICD-10-CM | POA: Diagnosis not present

## 2014-07-26 DIAGNOSIS — J45901 Unspecified asthma with (acute) exacerbation: Secondary | ICD-10-CM | POA: Insufficient documentation

## 2014-07-26 DIAGNOSIS — Z872 Personal history of diseases of the skin and subcutaneous tissue: Secondary | ICD-10-CM | POA: Diagnosis not present

## 2014-07-26 LAB — CBC
HCT: 38.6 % (ref 36.0–46.0)
Hemoglobin: 13.1 g/dL (ref 12.0–15.0)
MCH: 31.8 pg (ref 26.0–34.0)
MCHC: 33.9 g/dL (ref 30.0–36.0)
MCV: 93.7 fL (ref 78.0–100.0)
PLATELETS: 235 10*3/uL (ref 150–400)
RBC: 4.12 MIL/uL (ref 3.87–5.11)
RDW: 13.1 % (ref 11.5–15.5)
WBC: 7.8 10*3/uL (ref 4.0–10.5)

## 2014-07-26 LAB — BASIC METABOLIC PANEL
Anion gap: 7 (ref 5–15)
BUN: 11 mg/dL (ref 6–23)
CALCIUM: 8.8 mg/dL (ref 8.4–10.5)
CO2: 22 mmol/L (ref 19–32)
Chloride: 107 mmol/L (ref 96–112)
Creatinine, Ser: 0.87 mg/dL (ref 0.50–1.10)
GFR, EST NON AFRICAN AMERICAN: 80 mL/min — AB (ref >90)
GLUCOSE: 152 mg/dL — AB (ref 70–99)
Potassium: 4 mmol/L (ref 3.5–5.1)
Sodium: 136 mmol/L (ref 135–145)

## 2014-07-26 LAB — I-STAT TROPONIN, ED: Troponin i, poc: 0.01 ng/mL (ref 0.00–0.08)

## 2014-07-26 LAB — D-DIMER, QUANTITATIVE (NOT AT ARMC): D DIMER QUANT: 0.3 ug{FEU}/mL (ref 0.00–0.48)

## 2014-07-26 MED ORDER — HYDROCODONE-HOMATROPINE 5-1.5 MG/5ML PO SYRP
5.0000 mL | ORAL_SOLUTION | Freq: Once | ORAL | Status: AC
  Administered 2014-07-26: 5 mL via ORAL
  Filled 2014-07-26: qty 5

## 2014-07-26 NOTE — ED Provider Notes (Signed)
CSN: [id number]     Arrival date & time 07/26/14  1903 History   First MD Initiated Contact with Patient 07/26/14 2158     Chief Complaint  Patient presents with  . Chest Pain  . Cough     (Consider location/radiation/quality/duration/timing/severity/associated sxs/prior Treatment) HPI Comments: Patient presents to the emergency department with chief complaints of shortness breath, cough, chest pain. She states the symptoms have been persistent for the past 5 days. She reports productive cough. She is been seen previously for a fall, and was diagnosed with a left rib contusion. She states that she has a history of asthma and has been taking her inhaler with no relief. She denies any fevers chills. The chest pain is aggravated with coughing and sneezing. It is also worsened with palpation. He  The history is provided by the patient. No language interpreter was used.    Past Medical History  Diagnosis Date  . Arthritis   . Asthma   . Eczema    History reviewed. No pertinent past surgical history. History reviewed. No pertinent family history. History  Substance Use Topics  . Smoking status: Former Smoker    Quit date: 07/09/2005  . Smokeless tobacco: Never Used  . Alcohol Use: 0.6 oz/week    1 Glasses of wine per week   OB History    No data available     Review of Systems  Constitutional: Negative for fever and chills.  Respiratory: Positive for cough and shortness of breath.   Cardiovascular: Positive for chest pain.  Gastrointestinal: Negative for nausea, vomiting, diarrhea and constipation.  Genitourinary: Negative for dysuria.  All other systems reviewed and are negative.     Allergies  Pollen extract  Home Medications   Prior to Admission medications   Medication Sig Start Date End Date Taking? Authorizing Provider  albuterol (PROVENTIL HFA;VENTOLIN HFA) 108 (90 BASE) MCG/ACT inhaler Inhale 2 puffs into the lungs every 4 (four) hours as needed. Patient  taking differently: Inhale 1-2 puffs into the lungs every 4 (four) hours as needed for wheezing or shortness of breath.  02/09/14   Waldemar Dickens, MD  Biotin 5 MG CAPS Take 5 mg by mouth daily.    Historical Provider, MD  BROMFED DM 30-2-10 MG/5ML syrup  03/14/14   Historical Provider, MD  cetirizine (ZYRTEC) 10 MG tablet Take 10 mg by mouth daily.    Historical Provider, MD  cyclobenzaprine (FLEXERIL) 10 MG tablet Take 1 tablet (10 mg total) by mouth 2 (two) times daily as needed for muscle spasms. 03/01/14   Etta Quill, NP  doxycycline (VIBRA-TABS) 100 MG tablet Take 100 mg by mouth 2 (two) times daily. 02/21/14   Historical Provider, MD  fluticasone-salmeterol (ADVAIR HFA) 115-21 MCG/ACT inhaler Inhale 2 puffs into the lungs 2 (two) times daily as needed (shortness of breath).    Historical Provider, MD  halobetasol (ULTRAVATE) 0.05 % ointment Apply 1 application topically 2 (two) times daily as needed (allergic reaction, rash).    Historical Provider, MD  ibuprofen (ADVIL,MOTRIN) 100 MG tablet Take 200 mg by mouth every 6 (six) hours as needed for pain or fever.    Historical Provider, MD  ibuprofen (ADVIL,MOTRIN) 800 MG tablet Take 800 mg by mouth 2 (two) times daily as needed for moderate pain.    Historical Provider, MD  LISINOPRIL PO Take by mouth.    Historical Provider, MD  methocarbamol (ROBAXIN) 500 MG tablet Take 1 tablet (500 mg total) by mouth 2 (two) times daily.  Patient not taking: Reported on 07/24/2014 03/06/14   Al Corpus, PA-C  methocarbamol (ROBAXIN) 500 MG tablet Take 1 tablet (500 mg total) by mouth 2 (two) times daily. 07/24/14   April Palumbo, MD  naproxen (NAPROSYN) 500 MG tablet Take 1 tablet (500 mg total) by mouth 2 (two) times daily. Patient taking differently: Take 500 mg by mouth 2 (two) times daily as needed for mild pain.  03/01/14   Etta Quill, NP  QVAR 80 MCG/ACT inhaler Inhale 2 puffs into the lungs 2 (two) times daily. 02/21/14   Historical Provider, MD   Spacer/Aero-Holding Chambers (AEROCHAMBER PLUS FLO-VU MEDIUM) MISC 1 each by Other route once. Use as directed Patient not taking: Reported on 07/24/2014 02/09/14   Waldemar Dickens, MD  traMADol (ULTRAM) 50 MG tablet Take 1 tablet (50 mg total) by mouth every 6 (six) hours as needed. 07/24/14   April Palumbo, MD  Triamcinolone Acetonide (TRIAMCINOLONE 0.1 % CREAM : EUCERIN) CREA Apply 1 application topically 2 (two) times daily. Disp 1 pound tub Patient not taking: Reported on 07/24/2014 06/13/13   Gregor Hams, MD   BP 136/83 mmHg  Pulse 99  Resp 23  SpO2 97%  LMP 07/14/2014 Physical Exam  Constitutional: She is oriented to person, place, and time. She appears well-developed and well-nourished.  HENT:  Head: Normocephalic and atraumatic.  Eyes: Conjunctivae and EOM are normal. Pupils are equal, round, and reactive to light.  Neck: Normal range of motion. Neck supple.  Cardiovascular: Regular rhythm.  Exam reveals no gallop and no friction rub.   No murmur heard. Mildly tachycardic  Pulmonary/Chest: Effort normal and breath sounds normal. No respiratory distress. She has no wheezes. She has no rales. She exhibits tenderness.  Anterior chest wall is tender to palpation, CTAB  Abdominal: Soft. Bowel sounds are normal. She exhibits no distension and no mass. There is no tenderness. There is no rebound and no guarding.  Musculoskeletal: Normal range of motion. She exhibits no edema or tenderness.  Neurological: She is alert and oriented to person, place, and time.  Skin: Skin is warm and dry.  Psychiatric: She has a normal mood and affect. Her behavior is normal. Judgment and thought content normal.  Nursing note and vitals reviewed.   ED Course  Procedures (including critical care time) Results for orders placed or performed during the hospital encounter of 07/26/14  CBC  Result Value Ref Range   WBC 7.8 4.0 - 10.5 K/uL   RBC 4.12 3.87 - 5.11 MIL/uL   Hemoglobin 13.1 12.0 - 15.0 g/dL    HCT 38.6 36.0 - 46.0 %   MCV 93.7 78.0 - 100.0 fL   MCH 31.8 26.0 - 34.0 pg   MCHC 33.9 30.0 - 36.0 g/dL   RDW 13.1 11.5 - 15.5 %   Platelets 235 150 - 400 K/uL  Basic metabolic panel  Result Value Ref Range   Sodium 136 135 - 145 mmol/L   Potassium 4.0 3.5 - 5.1 mmol/L   Chloride 107 96 - 112 mmol/L   CO2 22 19 - 32 mmol/L   Glucose, Bld 152 (H) 70 - 99 mg/dL   BUN 11 6 - 23 mg/dL   Creatinine, Ser 0.87 0.50 - 1.10 mg/dL   Calcium 8.8 8.4 - 10.5 mg/dL   GFR calc non Af Amer 80 (L) >90 mL/min   GFR calc Af Amer >90 >90 mL/min   Anion gap 7 5 - 15  D-dimer, quantitative  Result Value Ref Range  D-Dimer, Quant 0.30 0.00 - 0.48 ug/mL-FEU  I-stat troponin, ED (not at [facility])  Result Value Ref Range   Troponin i, poc 0.01 0.00 - 0.08 ng/mL   Comment 3           Dg Chest 2 View  07/26/2014   CLINICAL DATA:  Chest pain, history of asthma, cough starting Saturday night  EXAM: CHEST  2 VIEW  COMPARISON:  07/23/2014  FINDINGS: Cardiomediastinal silhouette is stable. No acute infiltrate or pleural effusion. No pulmonary edema. Bony thorax is unremarkable.  IMPRESSION: No active cardiopulmonary disease.   Electronically Signed   By: Lahoma Crocker M.D.   On: 07/26/2014 22:25   Dg Chest 2 View  07/23/2014   CLINICAL DATA:  Acute onset of productive cough and right-sided chest pain. Hoarseness. Initial encounter.  EXAM: CHEST  2 VIEW  COMPARISON:  MRI of the chest performed 08/15/2011  FINDINGS: The lungs are well-aerated and clear. There is no evidence of focal opacification, pleural effusion or pneumothorax.  The heart is normal in size; the mediastinal contour is within normal limits. No acute osseous abnormalities are seen.  IMPRESSION: No acute cardiopulmonary process seen.   Electronically Signed   By: Garald Balding M.D.   On: 07/23/2014 23:45     Imaging Review No results found.   EKG Interpretation None      MDM   Final diagnoses:  Cough  SOB (shortness of breath)  Pleuritic pain     Patient with chest pain that is worsened with coughing. She does report having a productive cough. Also reports being short of breath. Will check basic labs, and will reassess.  Labs a reassuring, chest x-ray is negative. However, given the patient was tachycardic when she arrived, and has had persistent chest pain that is somewhat pleuritic, I feel that the patient may benefit from a d-dimer. Discussed this with the patient, and she agrees. Will check d-dimer. Will reassess. If d-dimer is negative, anticipate discharge with cough and cold medicine.  D-dimer is negative. Is possible the patient could also have influenza. Recommend primary care follow-up. Patient is afebrile.  She is feeling improved.  Will discharge with PCP follow-up.  Patient understands and agrees with the plan.    Montine Circle, PA-C 07/27/14 1610  Fredia Sorrow, MD 07/30/14 [phone]

## 2014-07-26 NOTE — ED Notes (Signed)
Pt states that she has had a productive cough with green sputum with chest pain x 5 days. Was seen at [facility] on Sunday and was prescribed a muscle relaxer. Has felt worse since then. Alert and oriented.

## 2014-07-27 MED ORDER — HYDROCODONE-HOMATROPINE 5-1.5 MG/5ML PO SYRP: 5.0000 mL | ORAL_SOLUTION | Freq: Four times a day (QID) | ORAL | Status: DC | PRN

## 2014-07-27 NOTE — Discharge Instructions (Signed)
Cough, Adult  A cough is a reflex that helps clear your throat and airways. It can help heal the body or may be a reaction to an irritated airway. A cough may only last 2 or 3 weeks (acute) or may last more than 8 weeks (chronic).  CAUSES Acute cough:  Viral or bacterial infections. Chronic cough:  Infections.  Allergies.  Asthma.  Post-nasal drip.  Smoking.  Heartburn or acid reflux.  Some medicines.  Chronic lung problems (COPD).  Cancer. SYMPTOMS   Cough.  Fever.  Chest pain.  Increased breathing rate.  High-pitched whistling sound when breathing (wheezing).  Colored mucus that you cough up (sputum). TREATMENT   A bacterial cough may be treated with antibiotic medicine.  A viral cough must run its course and will not respond to antibiotics.  Your caregiver may recommend other treatments if you have a chronic cough. HOME CARE INSTRUCTIONS   Only take over-the-counter or prescription medicines for pain, discomfort, or fever as directed by your caregiver. Use cough suppressants only as directed by your caregiver.  Use a cold steam vaporizer or humidifier in your bedroom or home to help loosen secretions.  Sleep in a semi-upright position if your cough is worse at night.  Rest as needed.  Stop smoking if you smoke. SEEK IMMEDIATE MEDICAL CARE IF:   You have pus in your sputum.  Your cough starts to worsen.  You cannot control your cough with suppressants and are losing sleep.  You begin coughing up blood.  You have difficulty breathing.  You develop pain which is getting worse or is uncontrolled with medicine.  You have a fever. MAKE SURE YOU:   Understand these instructions.  Will watch your condition.  Will get help right away if you are not doing well or get worse. Document Released: 10/18/2010 Document Revised: 07/14/2011 Document Reviewed: 10/18/2010 [facility] Patient Information 2015 [place], [place]. This information is not intended  to replace advice given to you by your health care provider. Make sure you discuss any questions you have with your health care provider. Influenza Influenza ("the flu") is a viral infection of the respiratory tract. It occurs more often in winter months because people spend more time in close contact with one another. Influenza can make you feel very sick. Influenza easily spreads from person to person (contagious). CAUSES  Influenza is caused by a virus that infects the respiratory tract. You can catch the virus by breathing in droplets from an infected person's cough or sneeze. You can also catch the virus by touching something that was recently contaminated with the virus and then touching your mouth, nose, or eyes. RISKS AND COMPLICATIONS You may be at risk for a more severe case of influenza if you smoke cigarettes, have diabetes, have chronic heart disease (such as heart failure) or lung disease (such as asthma), or if you have a weakened immune system. Elderly people and pregnant women are also at risk for more serious infections. The most common problem of influenza is a lung infection (pneumonia). Sometimes, this problem can require emergency medical care and may be life threatening. SIGNS AND SYMPTOMS  Symptoms typically last 4 to 10 days and may include:  Fever.  Chills.  Headache, body aches, and muscle aches.  Sore throat.  Chest discomfort and cough.  Poor appetite.  Weakness or feeling tired.  Dizziness.  Nausea or vomiting. DIAGNOSIS  Diagnosis of influenza is often made based on your history and a physical exam. A nose or throat  swab test can be done to confirm the diagnosis. TREATMENT  In mild cases, influenza goes away on its own. Treatment is directed at relieving symptoms. For more severe cases, your health care provider may prescribe antiviral medicines to shorten the sickness. Antibiotic medicines are not effective because the infection is caused by a virus, not  by bacteria. HOME CARE INSTRUCTIONS  Take medicines only as directed by your health care provider.  Use a cool mist humidifier to make breathing easier.  Get plenty of rest until your temperature returns to normal. This usually takes 3 to 4 days.  Drink enough fluid to keep your urine clear or pale yellow.  Cover yourmouth and nosewhen coughing or sneezing,and wash your handswellto prevent thevirusfrom spreading.  Stay homefromwork orschool untilthe fever is gonefor at least 29full day. PREVENTION  An annual influenza vaccination (flu shot) is the best way to avoid getting influenza. An annual flu shot is now routinely recommended for all adults in the [place] IF:  You experiencechest pain, yourcough worsens,or you producemore mucus.  Youhave nausea,vomiting, ordiarrhea.  Your fever returns or gets worse. SEEK IMMEDIATE MEDICAL CARE IF:  You havetrouble breathing, you become short of breath,or your skin ornails becomebluish.  You have severe painor stiffnessin the neck.  You develop a sudden headache, or pain in the face or ear.  You have nausea or vomiting that you cannot control. MAKE SURE YOU:   Understand these instructions.  Will watch your condition.  Will get help right away if you are not doing well or get worse. Document Released: 04/18/2000 Document Revised: 09/05/2013 Document Reviewed: 07/21/2011 [facility] Patient Information 2015 [place], [place]. This information is not intended to replace advice given to you by your health care provider. Make sure you discuss any questions you have with your health care provider.

## 2014-07-27 NOTE — ED Notes (Signed)
D/c instructions reviewed w/ pt and family - pt and family deny any further questions or concerns at present. Pt ambulating independently w/ steady gait on d/c in no acute distress, A&Ox4. Rx given x1

## 2015-01-09 ENCOUNTER — Emergency Department (HOSPITAL_COMMUNITY)
Admission: EM | Admit: 2015-01-09 | Discharge: 2015-01-09 | Disposition: A | Discharge status: Home / Self Care | Payer: Medicaid Other | Attending: Emergency Medicine | Admitting: Emergency Medicine

## 2015-01-09 ENCOUNTER — Encounter (HOSPITAL_COMMUNITY): Payer: Self-pay

## 2015-01-09 ENCOUNTER — Emergency Department (HOSPITAL_COMMUNITY): Payer: Medicaid Other

## 2015-01-09 DIAGNOSIS — Y998 Other external cause status: Secondary | ICD-10-CM | POA: Diagnosis not present

## 2015-01-09 DIAGNOSIS — Y9389 Activity, other specified: Secondary | ICD-10-CM | POA: Insufficient documentation

## 2015-01-09 DIAGNOSIS — J45909 Unspecified asthma, uncomplicated: Secondary | ICD-10-CM | POA: Diagnosis not present

## 2015-01-09 DIAGNOSIS — S92511A Displaced fracture of proximal phalanx of right lesser toe(s), initial encounter for closed fracture: Secondary | ICD-10-CM | POA: Insufficient documentation

## 2015-01-09 DIAGNOSIS — Y9289 Other specified places as the place of occurrence of the external cause: Secondary | ICD-10-CM | POA: Diagnosis not present

## 2015-01-09 DIAGNOSIS — Z79899 Other long term (current) drug therapy: Secondary | ICD-10-CM | POA: Diagnosis not present

## 2015-01-09 DIAGNOSIS — M1990 Unspecified osteoarthritis, unspecified site: Secondary | ICD-10-CM | POA: Diagnosis not present

## 2015-01-09 DIAGNOSIS — Z872 Personal history of diseases of the skin and subcutaneous tissue: Secondary | ICD-10-CM | POA: Diagnosis not present

## 2015-01-09 DIAGNOSIS — Z87891 Personal history of nicotine dependence: Secondary | ICD-10-CM | POA: Diagnosis not present

## 2015-01-09 DIAGNOSIS — S99921A Unspecified injury of right foot, initial encounter: Secondary | ICD-10-CM | POA: Diagnosis present

## 2015-01-09 DIAGNOSIS — X58XXXA Exposure to other specified factors, initial encounter: Secondary | ICD-10-CM | POA: Insufficient documentation

## 2015-01-09 DIAGNOSIS — S92911A Unspecified fracture of right toe(s), initial encounter for closed fracture: Secondary | ICD-10-CM

## 2015-01-09 MED ORDER — NAPROXEN 500 MG PO TABS: 500.0000 mg | ORAL_TABLET | Freq: Two times a day (BID) | ORAL | Status: DC

## 2015-01-09 MED ORDER — TRAMADOL HCL 50 MG PO TABS: 50.0000 mg | ORAL_TABLET | Freq: Four times a day (QID) | ORAL | Status: DC | PRN

## 2015-01-09 NOTE — Discharge Instructions (Signed)
Keep your foot elevated at home, ice several times a day. Naproxen and tramadol for pain. Follow up as needed.   Toe Fracture Your caregiver has diagnosed you as having a fractured toe. A toe fracture is a break in the bone of a toe. "Buddy taping" is a way of splinting your broken toe, by taping the broken toe to the toe next to it. This "buddy taping" will keep the injured toe from moving beyond normal range of motion. Buddy taping also helps the toe heal in a more normal alignment. It may take 6 to 8 weeks for the toe injury to heal. [place] your toes taped together for as long as directed by your caregiver or until you see a doctor for a follow-up examination. You can change the tape after bathing. Always use a small piece of gauze or cotton between the toes when taping them together. This will help the skin stay dry and prevent infection.  Apply ice to the injury for 15-20 minutes each hour while awake for the first 2 days. Put the ice in a plastic bag and place a towel between the bag of ice and your skin.  After the first 2 days, apply heat to the injured area. Use heat for the next 2 to 3 days. Place a heating pad on the foot or soak the foot in warm water as directed by your caregiver.  Keep your foot elevated as much as possible to lessen swelling.  Wear sturdy, supportive shoes. The shoes should not pinch the toes or fit tightly against the toes.  Your caregiver may prescribe a rigid shoe if your foot is very swollen.  Your may be given crutches if the pain is too great and it hurts too much to walk.  Only take over-the-counter or prescription medicines for pain, discomfort, or fever as directed by your caregiver.  If your caregiver has given you a follow-up appointment, it is very important to keep that appointment. Not keeping the appointment could result in a chronic or permanent injury, pain, and disability. If there is any problem keeping the appointment,  you must call back to this facility for assistance. SEEK MEDICAL CARE IF:   You have increased pain or swelling, not relieved with medications.  The pain does not get better after 1 week.  Your injured toe is cold when the others are warm. SEEK IMMEDIATE MEDICAL CARE IF:   The toe becomes cold, numb, or white.  The toe becomes hot (inflamed) and red. Document Released: 04/18/2000 Document Revised: 07/14/2011 Document Reviewed: 12/06/2007 [facility] Patient Information 2015 [place], [place]. This information is not intended to replace advice given to you by your health care provider. Make sure you discuss any questions you have with your health care provider.

## 2015-01-09 NOTE — ED Provider Notes (Signed)
CSN: [id number]     Arrival date & time 01/09/15  0810 History   First MD Initiated Contact with Patient 01/09/15 0813     Chief Complaint  Patient presents with  . Toe Pain     (Consider location/radiation/quality/duration/timing/severity/associated sxs/prior Treatment) HPI Lisa Nash is a 44 y.o. female who presents to emergency department complaining of right third toe injury. Patient states that she started on the furniture 3 days ago. She states since then she has had swelling in the toe, pain with movement, unable to wear closed toe shoes. She has elevated her foot, however has not iced take any medications. Denies history of toe injury. No other complaints.  Past Medical History  Diagnosis Date  . Arthritis   . Asthma   . Eczema    History reviewed. No pertinent past surgical history. History reviewed. No pertinent family history. Social History  Substance Use Topics  . Smoking status: Former Smoker    Quit date: 07/09/2005  . Smokeless tobacco: Never Used  . Alcohol Use: 0.6 oz/week    1 Glasses of wine per week   OB History    No data available     Review of Systems  Constitutional: Negative for fever and chills.  Musculoskeletal: Positive for joint swelling and arthralgias.  Neurological: Negative for weakness and numbness.      Allergies  Pollen extract  Home Medications   Prior to Admission medications   Medication Sig Start Date End Date Taking? Authorizing Provider  albuterol (PROVENTIL HFA;VENTOLIN HFA) 108 (90 BASE) MCG/ACT inhaler Inhale 2 puffs into the lungs every 4 (four) hours as needed. Patient taking differently: Inhale 1-2 puffs into the lungs every 4 (four) hours as needed for wheezing or shortness of breath.  02/09/14   Waldemar Dickens, MD  Biotin 5 MG CAPS Take 5 mg by mouth daily.    Historical Provider, MD  cetirizine (ZYRTEC) 10 MG tablet Take 10 mg by mouth daily.    Historical Provider, MD  cyclobenzaprine (FLEXERIL) 10 MG  tablet Take 1 tablet (10 mg total) by mouth 2 (two) times daily as needed for muscle spasms. Patient not taking: Reported on 07/26/2014 03/01/14   Etta Quill, NP  doxycycline (VIBRA-TABS) 100 MG tablet Take 100 mg by mouth 2 (two) times daily. 02/21/14   Historical Provider, MD  fluticasone-salmeterol (ADVAIR HFA) 115-21 MCG/ACT inhaler Inhale 2 puffs into the lungs 2 (two) times daily as needed (shortness of breath).    Historical Provider, MD  halobetasol (ULTRAVATE) 0.05 % ointment Apply 1 application topically 2 (two) times daily as needed (allergic reaction, rash).    Historical Provider, MD  HYDROcodone-homatropine (HYCODAN) 5-1.5 MG/5ML syrup Take 5 mLs by mouth every 6 (six) hours as needed for cough. 07/27/14   Montine Circle, PA-C  ibuprofen (ADVIL,MOTRIN) 800 MG tablet Take 800 mg by mouth 2 (two) times daily as needed for moderate pain (pain).     Historical Provider, MD  methocarbamol (ROBAXIN) 500 MG tablet Take 1 tablet (500 mg total) by mouth 2 (two) times daily. Patient not taking: Reported on 07/24/2014 03/06/14   Al Corpus, PA-C  methocarbamol (ROBAXIN) 500 MG tablet Take 1 tablet (500 mg total) by mouth 2 (two) times daily. 07/24/14   April Palumbo, MD  naproxen (NAPROSYN) 500 MG tablet Take 1 tablet (500 mg total) by mouth 2 (two) times daily. Patient taking differently: Take 500 mg by mouth 2 (two) times daily as needed for mild pain.  03/01/14  Etta Quill, NP  PREDNISONE, PAK, PO Take 10 mg by mouth. 6 Day Course. Take 1 tablet by mouth TID on Day 4, Take 1 tablet by mouth BID on Day 5, Take1 tablet by mouth Once Daily.    Historical Provider, MD  QVAR 80 MCG/ACT inhaler Inhale 2 puffs into the lungs 2 (two) times daily. 02/21/14   Historical Provider, MD  Spacer/Aero-Holding Chambers (AEROCHAMBER PLUS FLO-VU MEDIUM) MISC 1 each by Other route once. Use as directed Patient not taking: Reported on 07/26/2014 02/09/14   Waldemar Dickens, MD  traMADol (ULTRAM) 50 MG tablet Take  1 tablet (50 mg total) by mouth every 6 (six) hours as needed. Patient not taking: Reported on 07/26/2014 07/24/14   April Palumbo, MD  Triamcinolone Acetonide (TRIAMCINOLONE 0.1 % CREAM : EUCERIN) CREA Apply 1 application topically 2 (two) times daily. Disp 1 pound tub Patient not taking: Reported on 07/24/2014 06/13/13   Gregor Hams, MD   BP 138/83 mmHg  Pulse 86  Temp(Src) 97.6 F (36.4 C) (Oral)  Resp 16  SpO2 100%  LMP 12/25/2014 Physical Exam  Constitutional: She appears well-developed and well-nourished. No distress.  Eyes: Conjunctivae are normal.  Neck: Neck supple.  Musculoskeletal:  Swelling noted to the right third toe. Diffusely tender. Pain with range of motion at [facility] TP and IP joints. There is some bruising to the plantar surface. No open skin or wounds. Refill less than 2 seconds  Skin: Skin is warm and dry.  Nursing note reviewed.   ED Course  Procedures (including critical care time) Labs Review Labs Reviewed - No data to display  Imaging Review Dg Toe 3rd Right  01/09/2015   CLINICAL DATA:  Pain swelling.  No known injury.  EXAM: RIGHT THIRD TOE  COMPARISON:  None.  FINDINGS: Tiny fracture along the lateral aspect of the base of the middle phalanx of the right third digit cannot be excluded. No other focal abnormality identified. No radiopaque foreign body .  IMPRESSION: Tiny fracture along the lateral aspect of the base of the middle phalanx of the right third digit cannot be excluded.   Electronically Signed   By: Marcello Moores  Register   On: 01/09/2015 09:10   I have personally reviewed and evaluated these images and lab results as part of my medical decision-making.   EKG Interpretation None      MDM   Final diagnoses:  Toe fracture, right, closed, initial encounter     patient with toe injury several days ago. We'll get an x-ray to rule out a fracture. Neurovascularly intact.  9:45 AM X-ray showing possible fracture. Placed in a postop shoe, buddy tape the  toes. Home with naproxen, tramadol, follow up as needed.  Filed Vitals:   01/09/15 0944  BP: 136/89  Pulse: 80  Temp: 98.3 F (36.8 C)  Resp: [id number], PA-C 01/09/15 [place], MD 01/09/15 [phone]

## 2015-01-09 NOTE — ED Notes (Signed)
Pt reports waking up Sunday morning with pain to right third toe.  Pt reports swelling and inability to put on shoes due to pain.

## 2015-01-11 ENCOUNTER — Other Ambulatory Visit: Payer: Self-pay

## 2015-01-11 DIAGNOSIS — Z1231 Encounter for screening mammogram for malignant neoplasm of breast: Secondary | ICD-10-CM

## 2015-01-31 ENCOUNTER — Ambulatory Visit
Admission: RE | Admit: 2015-01-31 | Discharge: 2015-01-31 | Disposition: A | Discharge status: Home / Self Care | Payer: Medicaid Other | Source: Ambulatory Visit

## 2015-01-31 DIAGNOSIS — Z1231 Encounter for screening mammogram for malignant neoplasm of breast: Secondary | ICD-10-CM

## 2015-02-06 ENCOUNTER — Other Ambulatory Visit: Payer: Self-pay | Admitting: Obstetrics

## 2015-02-06 DIAGNOSIS — R928 Other abnormal and inconclusive findings on diagnostic imaging of breast: Secondary | ICD-10-CM

## 2015-02-14 ENCOUNTER — Ambulatory Visit
Admission: RE | Admit: 2015-02-14 | Discharge: 2015-02-14 | Disposition: A | Discharge status: Home / Self Care | Payer: Medicaid Other | Source: Ambulatory Visit | Attending: Obstetrics | Admitting: Obstetrics

## 2015-02-14 ENCOUNTER — Other Ambulatory Visit: Payer: Self-pay | Admitting: Obstetrics

## 2015-02-14 DIAGNOSIS — R928 Other abnormal and inconclusive findings on diagnostic imaging of breast: Secondary | ICD-10-CM

## 2015-02-15 ENCOUNTER — Ambulatory Visit
Admission: RE | Admit: 2015-02-15 | Discharge: 2015-02-15 | Disposition: A | Discharge status: Home / Self Care | Payer: Medicaid Other | Source: Ambulatory Visit | Attending: Obstetrics | Admitting: Obstetrics

## 2015-02-15 ENCOUNTER — Other Ambulatory Visit: Payer: Self-pay | Admitting: Obstetrics

## 2015-02-15 DIAGNOSIS — R928 Other abnormal and inconclusive findings on diagnostic imaging of breast: Secondary | ICD-10-CM

## 2015-07-26 ENCOUNTER — Other Ambulatory Visit: Payer: Self-pay | Admitting: Obstetrics

## 2015-07-26 DIAGNOSIS — N6320 Unspecified lump in the left breast, unspecified quadrant: Secondary | ICD-10-CM

## 2015-07-30 LAB — CYTOLOGY - PAP: PAP SMEAR: NEGATIVE

## 2015-07-31 ENCOUNTER — Other Ambulatory Visit (HOSPITAL_COMMUNITY): Payer: Self-pay | Admitting: Obstetrics

## 2015-07-31 DIAGNOSIS — D259 Leiomyoma of uterus, unspecified: Secondary | ICD-10-CM

## 2015-08-03 ENCOUNTER — Ambulatory Visit (HOSPITAL_COMMUNITY)
Admission: RE | Admit: 2015-08-03 | Discharge: 2015-08-03 | Disposition: A | Discharge status: Home / Self Care | Payer: Medicaid Other | Source: Ambulatory Visit | Attending: Obstetrics | Admitting: Obstetrics

## 2015-08-03 DIAGNOSIS — N852 Hypertrophy of uterus: Secondary | ICD-10-CM | POA: Insufficient documentation

## 2015-08-03 DIAGNOSIS — D251 Intramural leiomyoma of uterus: Secondary | ICD-10-CM | POA: Diagnosis present

## 2015-08-03 DIAGNOSIS — D252 Subserosal leiomyoma of uterus: Secondary | ICD-10-CM | POA: Insufficient documentation

## 2015-08-03 DIAGNOSIS — D250 Submucous leiomyoma of uterus: Secondary | ICD-10-CM | POA: Diagnosis not present

## 2015-08-03 DIAGNOSIS — D259 Leiomyoma of uterus, unspecified: Secondary | ICD-10-CM

## 2015-08-17 ENCOUNTER — Other Ambulatory Visit: Payer: Self-pay

## 2015-09-07 ENCOUNTER — Ambulatory Visit
Admission: RE | Admit: 2015-09-07 | Discharge: 2015-09-07 | Disposition: A | Discharge status: Home / Self Care | Payer: Medicaid Other | Source: Ambulatory Visit | Attending: Obstetrics | Admitting: Obstetrics

## 2015-09-07 DIAGNOSIS — N6320 Unspecified lump in the left breast, unspecified quadrant: Secondary | ICD-10-CM

## 2015-09-26 ENCOUNTER — Emergency Department (HOSPITAL_BASED_OUTPATIENT_CLINIC_OR_DEPARTMENT_OTHER)
Admission: EM | Admit: 2015-09-26 | Discharge: 2015-09-26 | Disposition: A | Discharge status: Home / Self Care | Payer: Medicaid Other | Attending: Emergency Medicine | Admitting: Emergency Medicine

## 2015-09-26 ENCOUNTER — Encounter (HOSPITAL_BASED_OUTPATIENT_CLINIC_OR_DEPARTMENT_OTHER): Payer: Self-pay

## 2015-09-26 DIAGNOSIS — M1990 Unspecified osteoarthritis, unspecified site: Secondary | ICD-10-CM | POA: Diagnosis not present

## 2015-09-26 DIAGNOSIS — Z87891 Personal history of nicotine dependence: Secondary | ICD-10-CM | POA: Insufficient documentation

## 2015-09-26 DIAGNOSIS — R21 Rash and other nonspecific skin eruption: Secondary | ICD-10-CM | POA: Diagnosis present

## 2015-09-26 DIAGNOSIS — L259 Unspecified contact dermatitis, unspecified cause: Secondary | ICD-10-CM | POA: Diagnosis not present

## 2015-09-26 DIAGNOSIS — J45909 Unspecified asthma, uncomplicated: Secondary | ICD-10-CM | POA: Insufficient documentation

## 2015-09-26 MED ORDER — HYDROXYZINE HCL 25 MG PO TABS: 25.0000 mg | ORAL_TABLET | Freq: Four times a day (QID) | ORAL | Status: DC

## 2015-09-26 MED ORDER — METHYLPREDNISOLONE SODIUM SUCC 125 MG IJ SOLR
125.0000 mg | Freq: Once | INTRAMUSCULAR | Status: AC
  Administered 2015-09-26: 125 mg via INTRAMUSCULAR
  Filled 2015-09-26: qty 2

## 2015-09-26 MED ORDER — PREDNISONE 10 MG PO TABS: ORAL_TABLET | ORAL | Status: DC

## 2015-09-26 MED FILL — predniSONE 10 MG TABS: 10 | 9 days supply | Qty: 18 | Fill #0

## 2015-09-26 MED FILL — hydrOXYzine HCL 25 MG TABS: 25 | 4 days supply | Qty: 15 | Fill #0

## 2015-09-26 NOTE — Discharge Instructions (Signed)
Prednisone as prescribed.  Hydroxyzine as prescribed as needed for itching.  Return to the emergency department if symptoms significantly worsen or change.   Contact Dermatitis Dermatitis is redness, soreness, and swelling (inflammation) of the skin. Contact dermatitis is a reaction to certain substances that touch the skin. There are two types of contact dermatitis:   Irritant contact dermatitis. This type is caused by something that irritates your skin, such as dry hands from washing them too much. This type does not require previous exposure to the substance for a reaction to occur. This type is more common.  Allergic contact dermatitis. This type is caused by a substance that you are allergic to, such as a nickel allergy or poison ivy. This type only occurs if you have been exposed to the substance (allergen) before. Upon a repeat exposure, your body reacts to the substance. This type is less common. CAUSES  Many different substances can cause contact dermatitis. Irritant contact dermatitis is most commonly caused by exposure to:   Makeup.   Soaps.   Detergents.   Bleaches.   Acids.   Metal salts, such as nickel.  Allergic contact dermatitis is most commonly caused by exposure to:   Poisonous plants.   Chemicals.   Jewelry.   Latex.   Medicines.   Preservatives in products, such as clothing.  RISK FACTORS This condition is more likely to develop in:   People who have jobs that expose them to irritants or allergens.  People who have certain medical conditions, such as asthma or eczema.  SYMPTOMS  Symptoms of this condition may occur anywhere on your body where the irritant has touched you or is touched by you. Symptoms include:  Dryness or flaking.   Redness.   Cracks.   Itching.   Pain or a burning feeling.   Blisters.  Drainage of small amounts of blood or clear fluid from skin cracks. With allergic contact dermatitis, there may  also be swelling in areas such as the eyelids, mouth, or genitals.  DIAGNOSIS  This condition is diagnosed with a medical history and physical exam. A patch skin test may be performed to help determine the cause. If the condition is related to your job, you may need to see an occupational medicine specialist. TREATMENT Treatment for this condition includes figuring out what caused the reaction and protecting your skin from further contact. Treatment may also include:   Steroid creams or ointments. Oral steroid medicines may be needed in more severe cases.  Antibiotics or antibacterial ointments, if a skin infection is present.  Antihistamine lotion or an antihistamine taken by mouth to ease itching.  A bandage (dressing). HOME CARE INSTRUCTIONS Skin Care  Moisturize your skin as needed.   Apply cool compresses to the affected areas.  Try taking a bath with:  Epsom salts. Follow the instructions on the packaging. You can get these at your local pharmacy or grocery store.  Baking soda. Pour a small amount into the bath as directed by your health care provider.  Colloidal oatmeal. Follow the instructions on the packaging. You can get this at your local pharmacy or grocery store.  Try applying baking soda paste to your skin. Stir water into baking soda until it reaches a paste-like consistency.  Do not scratch your skin.  Bathe less frequently, such as every other day.  Bathe in lukewarm water. Avoid using hot water. Medicines  Take or apply over-the-counter and prescription medicines only as told by your health care provider.  If you were prescribed an antibiotic medicine, take or apply your antibiotic as told by your health care provider. Do not stop using the antibiotic even if your condition starts to improve. General Instructions  Keep all follow-up visits as told by your health care provider. This is important.  Avoid the substance that caused your reaction. If you  do not know what caused it, keep a journal to try to track what caused it. Write down:  What you eat.  What cosmetic products you use.  What you drink.  What you wear in the affected area. This includes jewelry.  If you were given a dressing, take care of it as told by your health care provider. This includes when to change and remove it. SEEK MEDICAL CARE IF:   Your condition does not improve with treatment.  Your condition gets worse.  You have signs of infection such as swelling, tenderness, redness, soreness, or warmth in the affected area.  You have a fever.  You have new symptoms. SEEK IMMEDIATE MEDICAL CARE IF:   You have a severe headache, neck pain, or neck stiffness.  You vomit.  You feel very sleepy.  You notice red streaks coming from the affected area.  Your bone or joint underneath the affected area becomes painful after the skin has healed.  The affected area turns darker.  You have difficulty breathing.   This information is not intended to replace advice given to you by your health care provider. Make sure you discuss any questions you have with your health care provider.   Document Released: 04/18/2000 Document Revised: 01/10/2015 Document Reviewed: 09/06/2014 Elsevier Interactive Patient Education [employer].

## 2015-09-26 NOTE — ED Notes (Signed)
Pt teaching provided on medications that may cause drowsiness. Pt instructed not to drive or operate heavy machinery while taking the prescribed medication. Pt verbalized understanding.   

## 2015-09-26 NOTE — ED Provider Notes (Signed)
CSN: [id number]     Arrival date & time 09/26/15  1144 History   First MD Initiated Contact with Patient 09/26/15 1219     Chief Complaint  Patient presents with  . Rash     (Consider location/radiation/quality/duration/timing/severity/associated sxs/prior Treatment) HPI Comments: Patient is a 45 year old female with history of asthma. She presents for evaluation of rash. She reports moving some tree limbs in her yard several days ago. Shortly after, she began developing a raised, itchy rash to her arms, legs, neck, and torso. It has been unrelieved with Benadryl. She denies any difficulty breathing.  Patient is a 45 y.o. female presenting with rash. The history is provided by the patient.  Rash Location:  Full body Quality: itchiness and redness   Severity:  Moderate Onset quality:  Sudden Duration:  3 days Timing:  Constant Progression:  Worsening Chronicity:  New Context: plant contact   Relieved by:  Nothing Worsened by:  Nothing tried Ineffective treatments:  None tried   Past Medical History  Diagnosis Date  . Arthritis   . Asthma   . Eczema    History reviewed. No pertinent past surgical history. No family history on file. Social History  Substance Use Topics  . Smoking status: Former Smoker    Quit date: 07/09/2005  . Smokeless tobacco: Never Used  . Alcohol Use: 0.6 oz/week    1 Glasses of wine per week   OB History    No data available     Review of Systems  Skin: Positive for rash.  All other systems reviewed and are negative.     Allergies  Adhesive and Pollen extract  Home Medications   Prior to Admission medications   Medication Sig Start Date End Date Taking? Authorizing Provider  albuterol (PROVENTIL HFA;VENTOLIN HFA) 108 (90 BASE) MCG/ACT inhaler Inhale 2 puffs into the lungs every 4 (four) hours as needed. Patient taking differently: Inhale 1-2 puffs into the lungs every 4 (four) hours as needed for wheezing or shortness of breath.   02/09/14   Waldemar Dickens, MD  Biotin 5 MG CAPS Take 5 mg by mouth daily.    Historical Provider, MD  cetirizine (ZYRTEC) 10 MG tablet Take 10 mg by mouth daily.    Historical Provider, MD  cyclobenzaprine (FLEXERIL) 10 MG tablet Take 1 tablet (10 mg total) by mouth 2 (two) times daily as needed for muscle spasms. Patient not taking: Reported on 07/26/2014 03/01/14   Etta Quill, NP  doxycycline (VIBRA-TABS) 100 MG tablet Take 100 mg by mouth 2 (two) times daily. 02/21/14   Historical Provider, MD  fluticasone-salmeterol (ADVAIR HFA) 115-21 MCG/ACT inhaler Inhale 2 puffs into the lungs 2 (two) times daily as needed (shortness of breath).    Historical Provider, MD  halobetasol (ULTRAVATE) 0.05 % ointment Apply 1 application topically 2 (two) times daily as needed (allergic reaction, rash).    Historical Provider, MD  HYDROcodone-homatropine (HYCODAN) 5-1.5 MG/5ML syrup Take 5 mLs by mouth every 6 (six) hours as needed for cough. 07/27/14   Montine Circle, PA-C  ibuprofen (ADVIL,MOTRIN) 800 MG tablet Take 800 mg by mouth 2 (two) times daily as needed for moderate pain (pain).     Historical Provider, MD  methocarbamol (ROBAXIN) 500 MG tablet Take 1 tablet (500 mg total) by mouth 2 (two) times daily. Patient not taking: Reported on 07/24/2014 03/06/14   Al Corpus, PA-C  methocarbamol (ROBAXIN) 500 MG tablet Take 1 tablet (500 mg total) by mouth 2 (two) times daily. 07/24/14  April Palumbo, MD  naproxen (NAPROSYN) 500 MG tablet Take 1 tablet (500 mg total) by mouth 2 (two) times daily. Patient taking differently: Take 500 mg by mouth 2 (two) times daily as needed for mild pain.  03/01/14   Etta Quill, NP  naproxen (NAPROSYN) 500 MG tablet Take 1 tablet (500 mg total) by mouth 2 (two) times daily. 01/09/15   Tatyana Kirichenko, PA-C  PREDNISONE, PAK, PO Take 10 mg by mouth. 6 Day Course. Take 1 tablet by mouth TID on Day 4, Take 1 tablet by mouth BID on Day 5, Take1 tablet by mouth Once Daily.     Historical Provider, MD  QVAR 80 MCG/ACT inhaler Inhale 2 puffs into the lungs 2 (two) times daily. 02/21/14   Historical Provider, MD  Spacer/Aero-Holding Chambers (AEROCHAMBER PLUS FLO-VU MEDIUM) MISC 1 each by Other route once. Use as directed Patient not taking: Reported on 07/26/2014 02/09/14   Waldemar Dickens, MD  traMADol (ULTRAM) 50 MG tablet Take 1 tablet (50 mg total) by mouth every 6 (six) hours as needed. Patient not taking: Reported on 07/26/2014 07/24/14   April Palumbo, MD  traMADol (ULTRAM) 50 MG tablet Take 1 tablet (50 mg total) by mouth every 6 (six) hours as needed. 01/09/15   Tatyana Kirichenko, PA-C  Triamcinolone Acetonide (TRIAMCINOLONE 0.1 % CREAM : EUCERIN) CREA Apply 1 application topically 2 (two) times daily. Disp 1 pound tub Patient not taking: Reported on 07/24/2014 06/13/13   Gregor Hams, MD   BP 116/97 mmHg  Pulse 94  Temp(Src) 97.8 F (36.6 C) (Oral)  Resp 18  Ht 5\' 1"  (1.549 m)  Wt 167 lb (75.751 kg)  BMI 31.57 kg/m2  SpO2 98%  LMP 09/21/2015 Physical Exam  Constitutional: She is oriented to person, place, and time. She appears well-developed and well-nourished. No distress.  HENT:  Head: Normocephalic and atraumatic.  Neck: Normal range of motion. Neck supple.  Cardiovascular: Normal rate and regular rhythm.  Exam reveals no gallop and no friction rub.   No murmur heard. Pulmonary/Chest: Effort normal and breath sounds normal. No respiratory distress. She has no wheezes.  Abdominal: Soft. Bowel sounds are normal. She exhibits no distension. There is no tenderness.  Musculoskeletal: Normal range of motion.  Neurological: She is alert and oriented to person, place, and time.  Skin: Skin is warm and dry. Rash noted. She is not diaphoretic.  There is a generalized macular rash to the arms, legs, torso, and neck.  Nursing note and vitals reviewed.   ED Course  Procedures (including critical care time) Labs Review Labs Reviewed - No data to  display  Imaging Review No results found. I have personally reviewed and evaluated these images and lab results as part of my medical decision-making.   EKG Interpretation None      MDM   Final diagnoses:  None    This appears to be a contact dermatitis, likely poison ivy. She'll be given an IM dose of Solu-Medrol and treated with by mouth prednisone and hydroxyzine.    Veryl Speak, MD 09/26/15 1225

## 2015-09-26 NOTE — ED Notes (Signed)
Pt states she was working in the woods two days ago then developed a papular, erythremic rash on neck, arms, abd, and legs. Pt states the rash is pruretic. Pt states she has tried benadryl gel, benadryl PO, and cetirizine with no relief.

## 2016-01-22 ENCOUNTER — Other Ambulatory Visit: Payer: Self-pay | Admitting: Internal Medicine

## 2016-01-22 DIAGNOSIS — Z1231 Encounter for screening mammogram for malignant neoplasm of breast: Secondary | ICD-10-CM

## 2016-02-04 ENCOUNTER — Ambulatory Visit: Payer: Self-pay

## 2016-02-26 ENCOUNTER — Ambulatory Visit
Admission: RE | Admit: 2016-02-26 | Discharge: 2016-02-26 | Disposition: A | Discharge status: Home / Self Care | Payer: Medicaid Other | Source: Ambulatory Visit | Attending: Internal Medicine | Admitting: Internal Medicine

## 2016-02-26 DIAGNOSIS — Z1231 Encounter for screening mammogram for malignant neoplasm of breast: Secondary | ICD-10-CM

## 2016-03-06 ENCOUNTER — Telehealth: Payer: Self-pay | Admitting: Hematology and Oncology

## 2016-03-06 NOTE — Telephone Encounter (Signed)
Faxed pt office note to Dr. Baird Cancer [phone]

## 2016-05-08 ENCOUNTER — Encounter (HOSPITAL_COMMUNITY): Payer: Self-pay | Admitting: Emergency Medicine

## 2016-05-08 ENCOUNTER — Emergency Department (HOSPITAL_COMMUNITY)
Admission: EM | Admit: 2016-05-08 | Discharge: 2016-05-08 | Disposition: A | Discharge status: Home / Self Care | Payer: Medicaid Other | Attending: Physician Assistant | Admitting: Physician Assistant

## 2016-05-08 ENCOUNTER — Emergency Department (HOSPITAL_COMMUNITY): Payer: Medicaid Other

## 2016-05-08 DIAGNOSIS — Y990 Civilian activity done for income or pay: Secondary | ICD-10-CM | POA: Diagnosis not present

## 2016-05-08 DIAGNOSIS — Y9389 Activity, other specified: Secondary | ICD-10-CM | POA: Diagnosis not present

## 2016-05-08 DIAGNOSIS — Z87891 Personal history of nicotine dependence: Secondary | ICD-10-CM | POA: Insufficient documentation

## 2016-05-08 DIAGNOSIS — Z79899 Other long term (current) drug therapy: Secondary | ICD-10-CM | POA: Diagnosis not present

## 2016-05-08 DIAGNOSIS — Y929 Unspecified place or not applicable: Secondary | ICD-10-CM | POA: Insufficient documentation

## 2016-05-08 DIAGNOSIS — S93402A Sprain of unspecified ligament of left ankle, initial encounter: Secondary | ICD-10-CM | POA: Diagnosis not present

## 2016-05-08 DIAGNOSIS — S99912A Unspecified injury of left ankle, initial encounter: Secondary | ICD-10-CM | POA: Diagnosis present

## 2016-05-08 DIAGNOSIS — X509XXA Other and unspecified overexertion or strenuous movements or postures, initial encounter: Secondary | ICD-10-CM | POA: Insufficient documentation

## 2016-05-08 DIAGNOSIS — J45909 Unspecified asthma, uncomplicated: Secondary | ICD-10-CM | POA: Diagnosis not present

## 2016-05-08 MED ORDER — DICLOFENAC SODIUM 50 MG PO TBEC: 50.0000 mg | DELAYED_RELEASE_TABLET | Freq: Two times a day (BID) | ORAL | 0 refills | Status: DC

## 2016-05-08 NOTE — ED Provider Notes (Signed)
[facility] DEPT Provider Note   CSN: [id number] Arrival date & time: 05/08/16  2011  By signing my name below, I, Lisa Nash, attest that this documentation has been prepared under the direction and in the presence of [facility], [place].  Electronically Signed: Reola Nash, ED Scribe. 05/08/16. 9:39 PM.  History   Chief Complaint Chief Complaint  Patient presents with  . Ankle Pain   The history is provided by the patient. No language interpreter was used.  Ankle Pain   The incident occurred yesterday. The incident occurred at work. Injury mechanism: eversion. The pain is present in the left ankle. The quality of the pain is described as throbbing. The pain is at a severity of 8/10. The pain has been constant since onset. Pertinent negatives include no numbness.    HPI Comments: Lisa Nash is a 46 y.o. female with a h/o arthritis, who presents to the Emergency Department complaining of sudden onset, gradually worsening left ankle pain s/p injury which occurred yesterday. She rates her current pain as 8/10. Pt reports that yesterday she slipped on a patch of ice, and while trying to stabilize herself she everted her left ankle. No fall events, LOC, or reported injury otherwise. Pt has been ambulatory since the onset of her pain, but notes that her pain is exacerbated with ambulation. She has been taking Ibuprofen and using cryotherapy without relief of her pain. She denies numbness, tingling, or any other associated symptoms.   Past Medical History:  Diagnosis Date  . Arthritis   . Asthma   . Eczema    Patient Active Problem List   Diagnosis Date Noted  . Bruising 03/25/2014  . Eczema    History reviewed. No pertinent surgical history.  OB History    No data available     Home Medications    Prior to Admission medications   Medication Sig Start Date End Date Taking? Authorizing Provider  albuterol (PROVENTIL HFA;VENTOLIN HFA) 108 (90 BASE) MCG/ACT  inhaler Inhale 2 puffs into the lungs every 4 (four) hours as needed. Patient taking differently: Inhale 1-2 puffs into the lungs every 4 (four) hours as needed for wheezing or shortness of breath.  02/09/14   Waldemar Dickens, MD  Biotin 5 MG CAPS Take 5 mg by mouth daily.    Historical Provider, MD  cetirizine (ZYRTEC) 10 MG tablet Take 10 mg by mouth daily.    Historical Provider, MD  cyclobenzaprine (FLEXERIL) 10 MG tablet Take 1 tablet (10 mg total) by mouth 2 (two) times daily as needed for muscle spasms. Patient not taking: Reported on 07/26/2014 03/01/14   Etta Quill, NP  diclofenac (VOLTAREN) 50 MG EC tablet Take 1 tablet (50 mg total) by mouth 2 (two) times daily. 05/08/16   Jerri Hargadon Bunnie Pion, NP  doxycycline (VIBRA-TABS) 100 MG tablet Take 100 mg by mouth 2 (two) times daily. 02/21/14   Historical Provider, MD  fluticasone-salmeterol (ADVAIR HFA) 115-21 MCG/ACT inhaler Inhale 2 puffs into the lungs 2 (two) times daily as needed (shortness of breath).    Historical Provider, MD  halobetasol (ULTRAVATE) 0.05 % ointment Apply 1 application topically 2 (two) times daily as needed (allergic reaction, rash).    Historical Provider, MD  HYDROcodone-homatropine (HYCODAN) 5-1.5 MG/5ML syrup Take 5 mLs by mouth every 6 (six) hours as needed for cough. 07/27/14   Montine Circle, PA-C  hydrOXYzine (ATARAX/VISTARIL) 25 MG tablet Take 1 tablet (25 mg total) by mouth every 6 (six) hours. 09/26/15  Veryl Speak, MD  ibuprofen (ADVIL,MOTRIN) 800 MG tablet Take 800 mg by mouth 2 (two) times daily as needed for moderate pain (pain).     Historical Provider, MD  methocarbamol (ROBAXIN) 500 MG tablet Take 1 tablet (500 mg total) by mouth 2 (two) times daily. Patient not taking: Reported on 07/24/2014 03/06/14   Al Corpus, PA-C  methocarbamol (ROBAXIN) 500 MG tablet Take 1 tablet (500 mg total) by mouth 2 (two) times daily. 07/24/14   April Palumbo, MD  predniSONE (DELTASONE) 10 MG tablet Take 3 tablets daily for 3  days, then 2 tablets daily for 3 days, then 1 tablet daily for 3 days 09/26/15   Veryl Speak, MD  QVAR 80 MCG/ACT inhaler Inhale 2 puffs into the lungs 2 (two) times daily. 02/21/14   Historical Provider, MD  Spacer/Aero-Holding Chambers (AEROCHAMBER PLUS FLO-VU MEDIUM) MISC 1 each by Other route once. Use as directed Patient not taking: Reported on 07/26/2014 02/09/14   Waldemar Dickens, MD  traMADol (ULTRAM) 50 MG tablet Take 1 tablet (50 mg total) by mouth every 6 (six) hours as needed. Patient not taking: Reported on 07/26/2014 07/24/14   April Palumbo, MD  traMADol (ULTRAM) 50 MG tablet Take 1 tablet (50 mg total) by mouth every 6 (six) hours as needed. 01/09/15   Tatyana Kirichenko, PA-C  Triamcinolone Acetonide (TRIAMCINOLONE 0.1 % CREAM : EUCERIN) CREA Apply 1 application topically 2 (two) times daily. Disp 1 pound tub Patient not taking: Reported on 07/24/2014 06/13/13   Gregor Hams, MD   Family History No family history on file.  Social History Social History  Substance Use Topics  . Smoking status: Former Smoker    Quit date: 07/09/2005  . Smokeless tobacco: Never Used  . Alcohol use 0.6 oz/week    1 Glasses of wine per week   Allergies   Adhesive [tape] and Pollen extract  Review of Systems Review of Systems  Musculoskeletal: Positive for arthralgias (left ankle) and myalgias.  Neurological: Negative for numbness.  All other systems reviewed and are negative.  Physical Exam Updated Vital Signs BP 137/91   Pulse 100   Temp 97.7 F (36.5 C) (Oral)   Resp 16   LMP 04/09/2016 (Within Months)   SpO2 100%   Physical Exam  Constitutional: She appears well-developed and well-nourished. No distress.  HENT:  Head: Normocephalic and atraumatic.  Eyes: Conjunctivae are normal.  Neck: Normal range of motion.  Cardiovascular: Normal rate.   Pulses:      Dorsalis pedis pulses are 2+ on the right side, and 2+ on the left side.       Posterior tibial pulses are 2+ on the right side,  and 2+ on the left side.  Pulmonary/Chest: Effort normal.  Abdominal: She exhibits no distension.  Musculoskeletal: Normal range of motion.  No erythema, edema. There is tenderness to the medial aspect of the left ankle with palpation and ROM. Plantar flexion and dorsiflexion without difficulty. Achilles without defect and non-tender. Tender to the dorsum of the left ankle.   Neurological: She is alert.  Skin: Capillary refill takes less than 2 seconds. No erythema. No pallor.  Psychiatric: She has a normal mood and affect. Her behavior is normal.  Nursing note and vitals reviewed.  ED Treatments / Results  DIAGNOSTIC STUDIES: Oxygen Saturation is 100% on RA, normal by my interpretation.   COORDINATION OF CARE: 9:38 PM-Discussed next steps with pt. Pt verbalized understanding and is agreeable with the plan.   Labs (  all labs ordered are listed, but only abnormal results are displayed) Labs Reviewed - No data to display  Radiology Dg Ankle Complete Left  Result Date: 05/08/2016 CLINICAL DATA:  Status post twisting injury of the left ankle. EXAM: LEFT ANKLE COMPLETE - 3+ VIEW COMPARISON:  None. FINDINGS: There is no evidence of fracture, dislocation, or joint effusion. There is no evidence of arthropathy or other focal bone abnormality. Soft tissues are unremarkable. IMPRESSION: Negative. Electronically Signed   By: Fidela Salisbury M.D.   On: 05/08/2016 21:07    Procedures Procedures   Medications Ordered in ED Medications - No data to display  Initial Impression / Assessment and Plan / ED Course  I have reviewed the triage vital signs and the nursing notes.  Pertinent imaging results that were available during my care of the patient were reviewed by me and considered in my medical decision making (see chart for details).  Clinical Course    This is a 46yo female who presents into the ED following injury to the ankle. Will obtain DG left ankle.   9:38 PM Patient XR  negative for obvious fracture, dislocation, or other bony abnormalities. Pain managed in ED. Pt advised to follow up with orthopedics if symptoms persist for possibility of missed fracture diagnosis. Patient given brace and crutches while in ED, conservative therapy recommended and discussed. Patient will be d/c home. Pt is comfortable with above plan and is stable for discharge at this time. All questions were answered prior to disposition. Strict return precautions for f/u into the ED were discussed.   Final Clinical Impressions(s) / ED Diagnoses   Final diagnoses:  Sprain of left ankle, unspecified ligament, initial encounter   New Prescriptions Discharge Medication List as of 05/08/2016  9:47 PM    START taking these medications   Details  diclofenac (VOLTAREN) 50 MG EC tablet Take 1 tablet (50 mg total) by mouth 2 (two) times daily., Starting Thu 05/08/2016, Print       I personally performed the services described in this documentation, which was scribed in my presence. The recorded information has been reviewed and is accurate.     [place], NP 05/09/16 [place], MD 05/09/16 2310

## 2016-05-08 NOTE — ED Triage Notes (Signed)
Pt states "i slipped yesterday on some ice and twisted my left ankle." Pt is ambulatory.

## 2016-05-08 NOTE — Progress Notes (Signed)
Orthopedic Tech Progress Note Patient Details:  Lisa Nash [facility] 02-May-1971 [id number]  Ortho Devices Type of Ortho Device: ASO Ortho Device/Splint Location: LLE Ortho Device/Splint Interventions: Ordered, Application   Braulio Bosch 05/08/2016, 9:54 PM

## 2016-05-08 NOTE — Progress Notes (Signed)
Orthopedic Tech Progress Note Patient Details:  Lisa Nash [facility] 06/25/1970 [id number]  Ortho Devices Type of Ortho Device: Crutches Ortho Device/Splint Location: LLE Ortho Device/Splint Interventions: Ordered, Adjustment   Lisa Nash 05/08/2016, 10:00 PM

## 2017-06-12 ENCOUNTER — Other Ambulatory Visit: Payer: Self-pay | Admitting: Internal Medicine

## 2017-06-12 DIAGNOSIS — Z1231 Encounter for screening mammogram for malignant neoplasm of breast: Secondary | ICD-10-CM

## 2017-06-22 ENCOUNTER — Ambulatory Visit
Admission: RE | Admit: 2017-06-22 | Discharge: 2017-06-22 | Disposition: A | Discharge status: Home / Self Care | Payer: Medicaid Other | Source: Ambulatory Visit | Attending: Internal Medicine | Admitting: Internal Medicine

## 2017-06-22 DIAGNOSIS — Z1231 Encounter for screening mammogram for malignant neoplasm of breast: Secondary | ICD-10-CM

## 2017-09-10 ENCOUNTER — Other Ambulatory Visit (HOSPITAL_COMMUNITY)
Admission: RE | Admit: 2017-09-10 | Discharge: 2017-09-10 | Disposition: A | Discharge status: Home / Self Care | Payer: Medicaid Other | Source: Ambulatory Visit | Attending: Obstetrics | Admitting: Obstetrics

## 2017-09-10 ENCOUNTER — Encounter: Payer: Self-pay | Admitting: Obstetrics

## 2017-09-10 ENCOUNTER — Ambulatory Visit: Payer: Medicaid Other | Admitting: Obstetrics

## 2017-09-10 VITALS — BP 164/108 | HR 89 | Ht 61.0 in | Wt 173.5 lb

## 2017-09-10 DIAGNOSIS — Z01419 Encounter for gynecological examination (general) (routine) without abnormal findings: Secondary | ICD-10-CM | POA: Diagnosis not present

## 2017-09-10 DIAGNOSIS — Z124 Encounter for screening for malignant neoplasm of cervix: Secondary | ICD-10-CM | POA: Diagnosis not present

## 2017-09-10 DIAGNOSIS — N393 Stress incontinence (female) (male): Secondary | ICD-10-CM

## 2017-09-10 DIAGNOSIS — N946 Dysmenorrhea, unspecified: Secondary | ICD-10-CM

## 2017-09-10 DIAGNOSIS — D219 Benign neoplasm of connective and other soft tissue, unspecified: Secondary | ICD-10-CM

## 2017-09-10 DIAGNOSIS — F17210 Nicotine dependence, cigarettes, uncomplicated: Secondary | ICD-10-CM

## 2017-09-10 DIAGNOSIS — J45909 Unspecified asthma, uncomplicated: Secondary | ICD-10-CM

## 2017-09-10 DIAGNOSIS — Z0000 Encounter for general adult medical examination without abnormal findings: Secondary | ICD-10-CM | POA: Diagnosis not present

## 2017-09-10 DIAGNOSIS — E559 Vitamin D deficiency, unspecified: Secondary | ICD-10-CM

## 2017-09-10 DIAGNOSIS — I10 Essential (primary) hypertension: Secondary | ICD-10-CM

## 2017-09-10 DIAGNOSIS — N898 Other specified noninflammatory disorders of vagina: Secondary | ICD-10-CM

## 2017-09-10 DIAGNOSIS — Z113 Encounter for screening for infections with a predominantly sexual mode of transmission: Secondary | ICD-10-CM

## 2017-09-10 MED ORDER — IBUPROFEN 800 MG PO TABS: 800.0000 mg | ORAL_TABLET | Freq: Three times a day (TID) | ORAL | 5 refills | Status: DC | PRN

## 2017-09-10 MED ORDER — TRIAMTERENE-HCTZ 37.5-25 MG PO CAPS: 1.0000 | ORAL_CAPSULE | Freq: Every day | ORAL | 11 refills | Status: DC

## 2017-09-10 MED ORDER — CARVEDILOL 12.5 MG PO TABS: 12.5000 mg | ORAL_TABLET | Freq: Two times a day (BID) | ORAL | 11 refills | Status: DC

## 2017-09-10 NOTE — Patient Instructions (Addendum)
Uterine Fibroids Uterine fibroids are tissue masses (tumors) that can develop in the womb (uterus). They are also called leiomyomas. This type of tumor is not cancerous (benign) and does not spread to other parts of the body outside of the pelvic area, which is between the hip bones. Occasionally, fibroids may develop in the fallopian tubes, in the cervix, or on the support structures (ligaments) that surround the uterus. You can have one or many fibroids. Fibroids can vary in size, weight, and where they grow in the uterus. Some can become quite large. Most fibroids do not require medical treatment. What are the causes? A fibroid can develop when a single uterine cell keeps growing (replicating). Most cells in the human body have a control mechanism that keeps them from replicating without control. What are the signs or symptoms? Symptoms may include:  Heavy bleeding during your period.  Bleeding or spotting between periods.  Pelvic pain and pressure.  Bladder problems, such as needing to urinate more often (urinary frequency) or urgently.  Inability to reproduce offspring (infertility).  Miscarriages.  How is this diagnosed? Uterine fibroids are diagnosed through a physical exam. Your health care provider may feel the lumpy tumors during a pelvic exam. Ultrasonography and an MRI may be done to determine the size, location, and number of fibroids. How is this treated? Treatment may include:  Watchful waiting. This involves getting the fibroid checked by your health care provider to see if it grows or shrinks. Follow your health care provider's recommendations for how often to have this checked.  Hormone medicines. These can be taken by mouth or given through an intrauterine device (IUD).  Surgery. ? Removing the fibroids (myomectomy) or the uterus (hysterectomy). ? Removing blood supply to the fibroids (uterine artery embolization).  If fibroids interfere with your fertility and you  want to become pregnant, your health care provider may recommend having the fibroids removed. Follow these instructions at home:  Keep all follow-up visits as directed by your health care provider. This is important.  Take over-the-counter and prescription medicines only as told by your health care provider. ? If you were prescribed a hormone treatment, take the hormone medicines exactly as directed.  Ask your health care provider about taking iron pills and increasing the amount of dark green, leafy vegetables in your diet. These actions can help to boost your blood iron levels, which may be affected by heavy menstrual bleeding.  Pay close attention to your period and tell your health care provider about any changes, such as: ? Increased blood flow that requires you to use more pads or tampons than usual per month. ? A change in the number of days that your period lasts per month. ? A change in symptoms that are associated with your period, such as abdominal cramping or back pain. Contact a health care provider if:  You have pelvic pain, back pain, or abdominal cramps that cannot be controlled with medicines.  You have an increase in bleeding between and during periods.  You soak tampons or pads in a half hour or less.  You feel lightheaded, extra tired, or weak. Get help right away if:  You faint.  You have a sudden increase in pelvic pain. This information is not intended to replace advice given to you by your health care provider. Make sure you discuss any questions you have with your health care provider. Document Released: 04/18/2000 Document Revised: 12/20/2015 Document Reviewed: 10/18/2013 Elsevier Interactive Patient Education  2018 [employer]    Hypertension Hypertension, commonly called high blood pressure, is when the force of blood pumping through the arteries is too strong. The arteries are the blood vessels that carry blood from the heart throughout the body.  Hypertension forces the heart to work harder to pump blood and may cause arteries to become narrow or stiff. Having untreated or uncontrolled hypertension can cause heart attacks, strokes, kidney disease, and other problems. A blood pressure reading consists of a higher number over a lower number. Ideally, your blood pressure should be below 120/80. The first ("top") number is called the systolic pressure. It is a measure of the pressure in your arteries as your heart beats. The second ("bottom") number is called the diastolic pressure. It is a measure of the pressure in your arteries as the heart relaxes. What are the causes? The cause of this condition is not known. What increases the risk? Some risk factors for high blood pressure are under your control. Others are not. Factors you can change  Smoking.  Having type 2 diabetes mellitus, high cholesterol, or both.  Not getting enough exercise or physical activity.  Being overweight.  Having too much fat, sugar, calories, or salt (sodium) in your diet.  Drinking too much alcohol. Factors that are difficult or impossible to change  Having chronic kidney disease.  Having a family history of high blood pressure.  Age. Risk increases with age.  Race. You may be at higher risk if you are African-American.  Gender. Men are at higher risk than women before age 15. After age 96, women are at higher risk than men.  Having obstructive sleep apnea.  Stress. What are the signs or symptoms? Extremely high blood pressure (hypertensive crisis) may cause:  Headache.  Anxiety.  Shortness of breath.  Nosebleed.  Nausea and vomiting.  Severe chest pain.  Jerky movements you cannot control (seizures).  How is this diagnosed? This condition is diagnosed by measuring your blood pressure while you are seated, with your arm resting on a surface. The cuff of the blood pressure monitor will be placed directly against the skin of your upper  arm at the level of your heart. It should be measured at least twice using the same arm. Certain conditions can cause a difference in blood pressure between your right and left arms. Certain factors can cause blood pressure readings to be lower or higher than normal (elevated) for a short period of time:  When your blood pressure is higher when you are in a health care provider's office than when you are at home, this is called white coat hypertension. Most people with this condition do not need medicines.  When your blood pressure is higher at home than when you are in a health care provider's office, this is called masked hypertension. Most people with this condition may need medicines to control blood pressure.  If you have a high blood pressure reading during one visit or you have normal blood pressure with other risk factors:  You may be asked to return on a different day to have your blood pressure checked again.  You may be asked to monitor your blood pressure at home for 1 week or longer.  If you are diagnosed with hypertension, you may have other blood or imaging tests to help your health care provider understand your overall risk for other conditions. How is this treated? This condition is treated by making healthy lifestyle changes, such as eating healthy foods, exercising more, and reducing your alcohol intake.  Your health care provider may prescribe medicine if lifestyle changes are not enough to get your blood pressure under control, and if:  Your systolic blood pressure is above 130.  Your diastolic blood pressure is above 80.  Your personal target blood pressure may vary depending on your medical conditions, your age, and other factors. Follow these instructions at home: Eating and drinking  Eat a diet that is high in fiber and potassium, and low in sodium, added sugar, and fat. An example eating plan is called the DASH (Dietary Approaches to Stop Hypertension) diet. To eat  this way: ? Eat plenty of fresh fruits and vegetables. Try to fill half of your plate at each meal with fruits and vegetables. ? Eat whole grains, such as whole wheat pasta, brown rice, or whole grain bread. Fill about one quarter of your plate with whole grains. ? Eat or drink low-fat dairy products, such as skim milk or low-fat yogurt. ? Avoid fatty cuts of meat, processed or cured meats, and poultry with skin. Fill about one quarter of your plate with lean proteins, such as fish, chicken without skin, beans, eggs, and tofu. ? Avoid premade and processed foods. These tend to be higher in sodium, added sugar, and fat.  Reduce your daily sodium intake. Most people with hypertension should eat less than 1,500 mg of sodium a day.  Limit alcohol intake to no more than 1 drink a day for nonpregnant women and 2 drinks a day for men. One drink equals 12 oz of beer, 5 oz of wine, or 1 oz of hard liquor. Lifestyle  Work with your health care provider to maintain a healthy body weight or to lose weight. Ask what an ideal weight is for you.  Get at least 30 minutes of exercise that causes your heart to beat faster (aerobic exercise) most days of the week. Activities may include walking, swimming, or biking.  Include exercise to strengthen your muscles (resistance exercise), such as pilates or lifting weights, as part of your weekly exercise routine. Try to do these types of exercises for 30 minutes at least 3 days a week.  Do not use any products that contain nicotine or tobacco, such as cigarettes and e-cigarettes. If you need help quitting, ask your health care provider.  Monitor your blood pressure at home as told by your health care provider.  Keep all follow-up visits as told by your health care provider. This is important. Medicines  Take over-the-counter and prescription medicines only as told by your health care provider. Follow directions carefully. Blood pressure medicines must be taken as  prescribed.  Do not skip doses of blood pressure medicine. Doing this puts you at risk for problems and can make the medicine less effective.  Ask your health care provider about side effects or reactions to medicines that you should watch for. Contact a health care provider if:  You think you are having a reaction to a medicine you are taking.  You have headaches that keep coming back (recurring).  You feel dizzy.  You have swelling in your ankles.  You have trouble with your vision. Get help right away if:  You develop a severe headache or confusion.  You have unusual weakness or numbness.  You feel faint.  You have severe pain in your chest or abdomen.  You vomit repeatedly.  You have trouble breathing. Summary  Hypertension is when the force of blood pumping through your arteries is too strong. If this condition is  not controlled, it may put you at risk for serious complications.  Your personal target blood pressure may vary depending on your medical conditions, your age, and other factors. For most people, a normal blood pressure is less than 120/80.  Hypertension is treated with lifestyle changes, medicines, or a combination of both. Lifestyle changes include weight loss, eating a healthy, low-sodium diet, exercising more, and limiting alcohol. This information is not intended to replace advice given to you by your health care provider. Make sure you discuss any questions you have with your health care provider. Document Released: 04/21/2005 Document Revised: 03/19/2016 Document Reviewed: 03/19/2016 Elsevier Interactive Patient Education  2018 [employer].  Preventing Hypertension Hypertension, commonly called high blood pressure, is when the force of blood pumping through the arteries is too strong. Arteries are blood vessels that carry blood from the heart throughout the body. Over time, hypertension can damage the arteries and decrease blood flow to important parts  of the body, including the brain, heart, and kidneys. Often, hypertension does not cause symptoms until blood pressure is very high. For this reason, it is important to have your blood pressure checked on a regular basis. Hypertension can often be prevented with diet and lifestyle changes. If you already have hypertension, you can control it with diet and lifestyle changes, as well as medicine. What nutrition changes can be made? Maintain a healthy diet. This includes:  Eating less salt (sodium). Ask your health care provider how much sodium is safe for you to have. The general recommendation is to consume less than 1 tsp (2,300 mg) of sodium a day. ? Do not add salt to your food. ? Choose low-sodium options when grocery shopping and eating out.  Limiting fats in your diet. You can do this by eating low-fat or fat-free dairy products and by eating less red meat.  Eating more fruits, vegetables, and whole grains. Make a goal to eat: ? 1-2 cups of fresh fruits and vegetables each day. ? 3-4 servings of whole grains each day.  Avoiding foods and beverages that have added sugars.  Eating fish that contain healthy fats (omega-3 fatty acids), such as mackerel or salmon.  If you need help putting together a healthy eating plan, try the DASH diet. This diet is high in fruits, vegetables, and whole grains. It is low in sodium, red meat, and added sugars. DASH stands for Dietary Approaches to Stop Hypertension. What lifestyle changes can be made?  Lose weight if you are overweight. Losing just 3?5% of your body weight can help prevent or control hypertension. ? For example, if your present weight is 200 lb (91 kg), a loss of 3-5% of your weight means losing 6-10 lb (2.7-4.5 kg). ? Ask your health care provider to help you with a diet and exercise plan to safely lose weight.  Get enough exercise. Do at least 150 minutes of moderate-intensity exercise each week. ? You could do this in short exercise  sessions several times a day, or you could do longer exercise sessions a few times a week. For example, you could take a brisk 10-minute walk or bike ride, 3 times a day, for 5 days a week.  Find ways to reduce stress, such as exercising, meditating, listening to music, or taking a yoga class. If you need help reducing stress, ask your health care provider.  Do not smoke. This includes e-cigarettes. Chemicals in tobacco and nicotine products raise your blood pressure each time you smoke. If you need help  quitting, ask your health care provider.  Avoid alcohol. If you drink alcohol, limit alcohol intake to no more than 1 drink a day for nonpregnant women and 2 drinks a day for men. One drink equals 12 oz of beer, 5 oz of wine, or 1 oz of hard liquor. Why are these changes important? Diet and lifestyle changes can help you prevent hypertension, and they may make you feel better overall and improve your quality of life. If you have hypertension, making these changes will help you control it and help prevent major complications, such as:  Hardening and narrowing of arteries that supply blood to: ? Your heart. This can cause a heart attack. ? Your brain. This can cause a stroke. ? Your kidneys. This can cause kidney failure.  Stress on your heart muscle, which can cause heart failure.  What can I do to lower my risk?  Work with your health care provider to make a hypertension prevention plan that works for you. Follow your plan and keep all follow-up visits as told by your health care provider.  Learn how to check your blood pressure at home. Make sure that you know your personal target blood pressure, as told by your health care provider. How is this treated? In addition to diet and lifestyle changes, your health care provider may recommend medicines to help lower your blood pressure. You may need to try a few different medicines to find what works best for you. You also may need to take more  than one medicine. Take over-the-counter and prescription medicines only as told by your health care provider. Where to find support: Your health care provider can help you prevent hypertension and help you keep your blood pressure at a healthy level. Your local hospital or your community may also provide support services and prevention programs. The American Heart Association offers an online support network at: CheapBootlegs.com.cy Where to find more information: Learn more about hypertension from:  National Heart, Lung, and Blood Institute: ElectronicHangman.is  Centers for Disease Control and Prevention: https://ingram.com/  American Academy of Family Physicians: http://familydoctor.org/familydoctor/en/diseases-conditions/high-blood-pressure.printerview.all.html  Learn more about the DASH diet from:  [place] [place]: https://www.reyes.com/  Contact a health care provider if:  You think you are having a reaction to medicines you have taken.  You have recurrent headaches or feel dizzy.  You have swelling in your ankles.  You have trouble with your vision. Summary  Hypertension often does not cause any symptoms until blood pressure is very high. It is important to get your blood pressure checked regularly.  Diet and lifestyle changes are the most important steps in preventing hypertension.  By keeping your blood pressure in a healthy range, you can prevent complications like heart attack, heart failure, stroke, and kidney failure.  Work with your health care provider to make a hypertension prevention plan that works for you. This information is not intended to replace advice given to you by your health care provider. Make sure you discuss any questions you have with your health care provider. Document Released: 05/06/2015 Document Revised: 12/31/2015 Document  Reviewed: 12/31/2015 Elsevier Interactive Patient Education  2018 [employer].  Managing Your Hypertension Hypertension is commonly called high blood pressure. This is when the force of your blood pressing against the walls of your arteries is too strong. Arteries are blood vessels that carry blood from your heart throughout your body. Hypertension forces the heart to work harder to pump blood, and may cause the arteries to become  narrow or stiff. Having untreated or uncontrolled hypertension can cause heart attack, stroke, kidney disease, and other problems. What are blood pressure readings? A blood pressure reading consists of a higher number over a lower number. Ideally, your blood pressure should be below 120/80. The first ("top") number is called the systolic pressure. It is a measure of the pressure in your arteries as your heart beats. The second ("bottom") number is called the diastolic pressure. It is a measure of the pressure in your arteries as the heart relaxes. What does my blood pressure reading mean? Blood pressure is classified into four stages. Based on your blood pressure reading, your health care provider may use the following stages to determine what type of treatment you need, if any. Systolic pressure and diastolic pressure are measured in a unit called mm Hg. Normal  Systolic pressure: below 664.  Diastolic pressure: below 80. Elevated  Systolic pressure: 403-474.  Diastolic pressure: below 80. Hypertension stage 1  Systolic pressure: 259-563.  Diastolic pressure: 87-56. Hypertension stage 2  Systolic pressure: 433 or above.  Diastolic pressure: 90 or above. What health risks are associated with hypertension? Managing your hypertension is an important responsibility. Uncontrolled hypertension can lead to:  A heart attack.  A stroke.  A weakened blood vessel (aneurysm).  Heart failure.  Kidney damage.  Eye damage.  Metabolic syndrome.  Memory and  concentration problems.  What changes can I make to manage my hypertension? Hypertension can be managed by making lifestyle changes and possibly by taking medicines. Your health care provider will help you make a plan to bring your blood pressure within a normal range. Eating and drinking  Eat a diet that is high in fiber and potassium, and low in salt (sodium), added sugar, and fat. An example eating plan is called the DASH (Dietary Approaches to Stop Hypertension) diet. To eat this way: ? Eat plenty of fresh fruits and vegetables. Try to fill half of your plate at each meal with fruits and vegetables. ? Eat whole grains, such as whole wheat pasta, brown rice, or whole grain bread. Fill about one quarter of your plate with whole grains. ? Eat low-fat diary products. ? Avoid fatty cuts of meat, processed or cured meats, and poultry with skin. Fill about one quarter of your plate with lean proteins such as fish, chicken without skin, beans, eggs, and tofu. ? Avoid premade and processed foods. These tend to be higher in sodium, added sugar, and fat.  Reduce your daily sodium intake. Most people with hypertension should eat less than 1,500 mg of sodium a day.  Limit alcohol intake to no more than 1 drink a day for nonpregnant women and 2 drinks a day for men. One drink equals 12 oz of beer, 5 oz of wine, or 1 oz of hard liquor. Lifestyle  Work with your health care provider to maintain a healthy body weight, or to lose weight. Ask what an ideal weight is for you.  Get at least 30 minutes of exercise that causes your heart to beat faster (aerobic exercise) most days of the week. Activities may include walking, swimming, or biking.  Include exercise to strengthen your muscles (resistance exercise), such as weight lifting, as part of your weekly exercise routine. Try to do these types of exercises for 30 minutes at least 3 days a week.  Do not use any products that contain nicotine or tobacco,  such as cigarettes and e-cigarettes. If you need help quitting, ask your health  care provider.  Control any long-term (chronic) conditions you have, such as high cholesterol or diabetes. Monitoring  Monitor your blood pressure at home as told by your health care provider. Your personal target blood pressure may vary depending on your medical conditions, your age, and other factors.  Have your blood pressure checked regularly, as often as told by your health care provider. Working with your health care provider  Review all the medicines you take with your health care provider because there may be side effects or interactions.  Talk with your health care provider about your diet, exercise habits, and other lifestyle factors that may be contributing to hypertension.  Visit your health care provider regularly. Your health care provider can help you create and adjust your plan for managing hypertension. Will I need medicine to control my blood pressure? Your health care provider may prescribe medicine if lifestyle changes are not enough to get your blood pressure under control, and if:  Your systolic blood pressure is 130 or higher.  Your diastolic blood pressure is 80 or higher.  Take medicines only as told by your health care provider. Follow the directions carefully. Blood pressure medicines must be taken as prescribed. The medicine does not work as well when you skip doses. Skipping doses also puts you at risk for problems. Contact a health care provider if:  You think you are having a reaction to medicines you have taken.  You have repeated (recurrent) headaches.  You feel dizzy.  You have swelling in your ankles.  You have trouble with your vision. Get help right away if:  You develop a severe headache or confusion.  You have unusual weakness or numbness, or you feel faint.  You have severe pain in your chest or abdomen.  You vomit repeatedly.  You have trouble  breathing. Summary  Hypertension is when the force of blood pumping through your arteries is too strong. If this condition is not controlled, it may put you at risk for serious complications.  Your personal target blood pressure may vary depending on your medical conditions, your age, and other factors. For most people, a normal blood pressure is less than 120/80.  Hypertension is managed by lifestyle changes, medicines, or both. Lifestyle changes include weight loss, eating a healthy, low-sodium diet, exercising more, and limiting alcohol. This information is not intended to replace advice given to you by your health care provider. Make sure you discuss any questions you have with your health care provider. Document Released: 01/14/2012 Document Revised: 03/19/2016 Document Reviewed: 03/19/2016 Elsevier Interactive Patient Education  [employer].

## 2017-09-10 NOTE — Progress Notes (Addendum)
Subjective:        Lisa Nash is a 47 y.o. female here for a routine exam.  Current complaints: Painful and heavy periods.  History of Fibroids.  Leaking of urine with cough, sneeze, laughter, etc.  Personal health questionnaire:  Is patient Ashkenazi Jewish, have a family history of breast and/or ovarian cancer: no Is there a family history of uterine cancer diagnosed at age < 52, gastrointestinal cancer, urinary tract cancer, family member who is a Field seismologist syndrome-associated carrier: no Is the patient overweight and hypertensive, family history of diabetes, personal history of gestational diabetes, preeclampsia or PCOS: no Is patient over 51, have PCOS,  family history of premature CHD under age 52, diabetes, smoke, have hypertension or peripheral artery disease:  no At any time, has a partner hit, kicked or otherwise hurt or frightened you?: no Over the past 2 weeks, have you felt down, depressed or hopeless?: no Over the past 2 weeks, have you felt little interest or pleasure in doing things?:no   Gynecologic History Patient's last menstrual period was 09/03/2017 (exact date). Contraception: none Last Pap: 2017. Results were: normal Last mammogram: 2019. Results were: normal  Obstetric History OB History  No data available    Past Medical History:  Diagnosis Date  . Arthritis   . Asthma   . Eczema     History reviewed. No pertinent surgical history.   Current Outpatient Medications:  .  Biotin 5 MG CAPS, Take 5 mg by mouth daily., Disp: , Rfl:  .  cetirizine (ZYRTEC) 10 MG tablet, Take 10 mg by mouth daily., Disp: , Rfl:  .  fluticasone-salmeterol (ADVAIR HFA) 115-21 MCG/ACT inhaler, Inhale 2 puffs into the lungs 2 (two) times daily as needed (shortness of breath)., Disp: , Rfl:  .  albuterol (PROVENTIL) (2.5 MG/3ML) 0.083% nebulizer solution, U 3 ML VIA NEB TID, Disp: , Rfl: 2 .  carvedilol (COREG) 12.5 MG tablet, Take 1 tablet (12.5 mg total) by mouth 2  (two) times daily with a meal., Disp: 60 tablet, Rfl: 11 .  CHANTIX STARTING MONTH PAK 0.5 MG X 11 & 1 MG X 42 tablet, TK UTD, Disp: , Rfl: 0 .  cyclobenzaprine (FLEXERIL) 10 MG tablet, Take 1 tablet (10 mg total) by mouth 2 (two) times daily as needed for muscle spasms. (Patient not taking: Reported on 07/26/2014), Disp: 20 tablet, Rfl: 0 .  diclofenac (VOLTAREN) 50 MG EC tablet, Take 1 tablet (50 mg total) by mouth 2 (two) times daily. (Patient not taking: Reported on 09/10/2017), Disp: 15 tablet, Rfl: 0 .  doxycycline (VIBRA-TABS) 100 MG tablet, Take 100 mg by mouth 2 (two) times daily., Disp: , Rfl: 0 .  halobetasol (ULTRAVATE) 0.05 % ointment, Apply 1 application topically 2 (two) times daily as needed (allergic reaction, rash)., Disp: , Rfl:  .  HYDROcodone-homatropine (HYCODAN) 5-1.5 MG/5ML syrup, Take 5 mLs by mouth every 6 (six) hours as needed for cough. (Patient not taking: Reported on 09/10/2017), Disp: 120 mL, Rfl: 0 .  hydrOXYzine (ATARAX/VISTARIL) 25 MG tablet, Take 1 tablet (25 mg total) by mouth every 6 (six) hours. (Patient not taking: Reported on 09/10/2017), Disp: 15 tablet, Rfl: 0 .  ibuprofen (ADVIL,MOTRIN) 800 MG tablet, Take 1 tablet (800 mg total) by mouth every 8 (eight) hours as needed., Disp: 30 tablet, Rfl: 5 .  methocarbamol (ROBAXIN) 500 MG tablet, Take 1 tablet (500 mg total) by mouth 2 (two) times daily. (Patient not taking: Reported on 07/24/2014), Disp: 20 tablet,  Rfl: 0 .  methocarbamol (ROBAXIN) 500 MG tablet, Take 1 tablet (500 mg total) by mouth 2 (two) times daily. (Patient not taking: Reported on 09/10/2017), Disp: 20 tablet, Rfl: 0 .  montelukast (SINGULAIR) 10 MG tablet, TK 1 T PO QD IN THE EVE, Disp: , Rfl: 5 .  predniSONE (DELTASONE) 10 MG tablet, Take 3 tablets daily for 3 days, then 2 tablets daily for 3 days, then 1 tablet daily for 3 days (Patient not taking: Reported on 09/10/2017), Disp: 18 tablet, Rfl: 0 .  QVAR 80 MCG/ACT inhaler, Inhale 2 puffs into the lungs 2  (two) times daily., Disp: , Rfl: 0 .  Spacer/Aero-Holding Chambers (AEROCHAMBER PLUS FLO-VU MEDIUM) MISC, 1 each by Other route once. Use as directed (Patient not taking: Reported on 07/26/2014), Disp: 1 each, Rfl: 1 .  traMADol (ULTRAM) 50 MG tablet, Take 1 tablet (50 mg total) by mouth every 6 (six) hours as needed. (Patient not taking: Reported on 07/26/2014), Disp: 9 tablet, Rfl: 0 .  traMADol (ULTRAM) 50 MG tablet, Take 1 tablet (50 mg total) by mouth every 6 (six) hours as needed., Disp: 15 tablet, Rfl: 0 .  Triamcinolone Acetonide (TRIAMCINOLONE 0.1 % CREAM : EUCERIN) CREA, Apply 1 application topically 2 (two) times daily. Disp 1 pound tub (Patient not taking: Reported on 07/24/2014), Disp: 1 each, Rfl: 2 .  triamterene-hydrochlorothiazide (DYAZIDE) 37.5-25 MG capsule, Take 1 each (1 capsule total) by mouth daily., Disp: 30 capsule, Rfl: 11 .  Vitamin D, Ergocalciferol, (DRISDOL) 50000 units CAPS capsule, TK 1 C PO BID FOR 8 WEEKS, Disp: , Rfl: 2 Allergies  Allergen Reactions  . Adhesive [Tape]   . Pollen Extract     Social History   Tobacco Use  . Smoking status: Light Tobacco Smoker    Last attempt to quit: 07/09/2005    Years since quitting: 12.1  . Smokeless tobacco: Never Used  Substance Use Topics  . Alcohol use: Yes    Alcohol/week: 0.6 oz    Types: 1 Glasses of wine per week    History reviewed. No pertinent family history.    Review of Systems  Constitutional: negative for fatigue and weight loss Respiratory: negative for cough and wheezing Cardiovascular: negative for chest pain, fatigue and palpitations Gastrointestinal: negative for abdominal pain and change in bowel habits Musculoskeletal:negative for myalgias Neurological: negative for gait problems and tremors Behavioral/Psych: negative for abusive relationship, depression Endocrine: negative for temperature intolerance    Genitourinary:POSITIVE for abnormal menstrual periods - heavy and  painful Integument/breast: negative for breast lump, breast tenderness, nipple discharge and skin lesion(s)    Objective:       BP (!) 164/108   Pulse 89   Ht 5\' 1"  (1.549 m)   Wt 173 lb 8 oz (78.7 kg)   LMP 09/03/2017 (Exact Date)   BMI 32.78 kg/m  General:   alert  Skin:   no rash or abnormalities  Lungs:   clear to auscultation bilaterally  Heart:   regular rate and rhythm, S1, S2 normal, no murmur, click, rub or gallop  Breasts:   normal without suspicious masses, skin or nipple changes or axillary nodes  Abdomen:  normal findings: no organomegaly, soft, non-tender and no hernia  Pelvis:  External genitalia: normal general appearance Urinary system: urethral meatus normal and bladder without fullness, nontender Vaginal: normal without tenderness, induration or masses Cervix: normal appearance Adnexa: normal bimanual exam Uterus: anteverted and non-tender, enlarged   Lab Review Urine pregnancy test Labs reviewed yes  Radiologic studies reviewed yes  50% of 20 min visit spent on counseling and coordination of care.   Assessment and Plan:     1. Encounter for annual routine gynecological examination  2. Screening for cervical cancer Rx: - Cytology - PAP  3. Fibroids Rx: - US PELVIC COMPLETE WITH TRANSVAGINAL; Future  4. SUI (stress urinary incontinence, female) Rx: - Ambulatory referral to Urogynecology  5. Vaginal discharge Rx: - Cervicovaginal ancillary only  6. Dysmenorrhea Rx: - ibuprofen (ADVIL,MOTRIN) 800 MG tablet; Take 1 tablet (800 mg total) by mouth every 8 (eight) hours as needed.  Dispense: 30 tablet; Refill: 5  7. Screening for STD (sexually transmitted disease) Rx: - Hepatitis B surface antigen - Hepatitis C antibody - HIV antibody - RPR  8. Vitamin D deficiency Rx: - Vitamin D, Ergocalciferol, (DRISDOL) 50000 units CAPS capsule; TK 1 C PO BID FOR 8 WEEKS; Refill: 2  9. Tobacco dependence due to cigarettes Rx: - CHANTIX STARTING  MONTH PAK 0.5 MG X 11 & 1 MG X 42 tablet; TK UTD; Refill: 0  10. Asthma, unspecified asthma severity, unspecified whether complicated, unspecified whether persistent Rx: - montelukast (SINGULAIR) 10 MG tablet; TK 1 T PO QD IN THE EVE; Refill: 5 - albuterol (PROVENTIL) (2.5 MG/3ML) 0.083% nebulizer solution; U 3 ML VIA NEB TID; Refill: 2  11. HTN (hypertension), benign Rx: - carvedilol (COREG) 12.5 MG tablet; Take 1 tablet (12.5 mg total) by mouth 2 (two) times daily with a meal.  Dispense: 60 tablet; Refill: 11 - triamterene-hydrochlorothiazide (DYAZIDE) 37.5-25 MG capsule; Take 1 each (1 capsule total) by mouth daily.  Dispense: 30 capsule; Refill: 11    Plan:    Education reviewed: calcium supplements, depression evaluation, low fat, low cholesterol diet, safe sex/STD prevention, self breast exams, smoking cessation and weight bearing exercise. Follow up in: 2 weeks.   Meds ordered this encounter  Medications  . ibuprofen (ADVIL,MOTRIN) 800 MG tablet    Sig: Take 1 tablet (800 mg total) by mouth every 8 (eight) hours as needed.    Dispense:  30 tablet    Refill:  5  . carvedilol (COREG) 12.5 MG tablet    Sig: Take 1 tablet (12.5 mg total) by mouth 2 (two) times daily with a meal.    Dispense:  60 tablet    Refill:  11  . triamterene-hydrochlorothiazide (DYAZIDE) 37.5-25 MG capsule    Sig: Take 1 each (1 capsule total) by mouth daily.    Dispense:  30 capsule    Refill:  11   Orders Placed This Encounter  Procedures  . US PELVIC COMPLETE WITH TRANSVAGINAL    Standing Status:   Future    Standing Expiration Date:   11/11/2018    Order Specific Question:   Reason for Exam (SYMPTOM  OR DIAGNOSIS REQUIRED)    Answer:   Fibroids    Order Specific Question:   Preferred imaging location?    Answer:   [facility]  . Hepatitis B surface antigen  . Hepatitis C antibody  . HIV antibody  . RPR  . Ambulatory referral to Urogynecology    Referral Priority:   Routine    Referral  Type:   Consultation    Referral Reason:   Specialty Services Required    Requested Specialty:   Urology    Number of Visits Requested:   1    Shelly Bombard MD 09-10-2017

## 2017-09-10 NOTE — Progress Notes (Signed)
Patient presents for Annual Exam today.  CC: None   LMP:09/03/2017 Last pap: 07/30/2015 Contraception:none  Mammogram: 06/2017 STD Screening:Yes

## 2017-09-11 ENCOUNTER — Encounter (HOSPITAL_COMMUNITY): Payer: Self-pay

## 2017-09-11 ENCOUNTER — Emergency Department (HOSPITAL_COMMUNITY)
Admission: EM | Admit: 2017-09-11 | Discharge: 2017-09-12 | Disposition: A | Discharge status: Home / Self Care | Payer: Medicaid Other | Attending: Emergency Medicine | Admitting: Emergency Medicine

## 2017-09-11 ENCOUNTER — Emergency Department (HOSPITAL_COMMUNITY): Payer: Medicaid Other

## 2017-09-11 DIAGNOSIS — F17210 Nicotine dependence, cigarettes, uncomplicated: Secondary | ICD-10-CM | POA: Diagnosis not present

## 2017-09-11 DIAGNOSIS — R55 Syncope and collapse: Secondary | ICD-10-CM | POA: Diagnosis not present

## 2017-09-11 DIAGNOSIS — R079 Chest pain, unspecified: Secondary | ICD-10-CM | POA: Diagnosis not present

## 2017-09-11 DIAGNOSIS — Z79899 Other long term (current) drug therapy: Secondary | ICD-10-CM | POA: Diagnosis not present

## 2017-09-11 DIAGNOSIS — I959 Hypotension, unspecified: Secondary | ICD-10-CM | POA: Insufficient documentation

## 2017-09-11 DIAGNOSIS — J45909 Unspecified asthma, uncomplicated: Secondary | ICD-10-CM | POA: Insufficient documentation

## 2017-09-11 LAB — BASIC METABOLIC PANEL
ANION GAP: 9 (ref 5–15)
BUN: 10 mg/dL (ref 6–20)
CHLORIDE: 108 mmol/L (ref 101–111)
CO2: 23 mmol/L (ref 22–32)
CREATININE: 0.82 mg/dL (ref 0.44–1.00)
Calcium: 9 mg/dL (ref 8.9–10.3)
Glucose, Bld: 90 mg/dL (ref 65–99)
Potassium: 3.7 mmol/L (ref 3.5–5.1)
Sodium: 140 mmol/L (ref 135–145)

## 2017-09-11 LAB — URINALYSIS, ROUTINE W REFLEX MICROSCOPIC
Bilirubin Urine: NEGATIVE
Glucose, UA: NEGATIVE mg/dL
Hgb urine dipstick: NEGATIVE
KETONES UR: NEGATIVE mg/dL
Leukocytes, UA: NEGATIVE
NITRITE: NEGATIVE
PROTEIN: NEGATIVE mg/dL
Specific Gravity, Urine: 1.016 (ref 1.005–1.030)
pH: 6 (ref 5.0–8.0)

## 2017-09-11 LAB — I-STAT TROPONIN, ED: TROPONIN I, POC: 0 ng/mL (ref 0.00–0.08)

## 2017-09-11 LAB — HIV ANTIBODY (ROUTINE TESTING W REFLEX): HIV Screen 4th Generation wRfx: NONREACTIVE

## 2017-09-11 LAB — RPR: RPR Ser Ql: NONREACTIVE

## 2017-09-11 LAB — CBC
HCT: 35.2 % — ABNORMAL LOW (ref 36.0–46.0)
Hemoglobin: 11.9 g/dL — ABNORMAL LOW (ref 12.0–15.0)
MCH: 30.4 pg (ref 26.0–34.0)
MCHC: 33.8 g/dL (ref 30.0–36.0)
MCV: 90 fL (ref 78.0–100.0)
PLATELETS: 288 10*3/uL (ref 150–400)
RBC: 3.91 MIL/uL (ref 3.87–5.11)
RDW: 13.8 % (ref 11.5–15.5)
WBC: 10.4 10*3/uL (ref 4.0–10.5)

## 2017-09-11 LAB — I-STAT BETA HCG BLOOD, ED (MC, WL, AP ONLY)

## 2017-09-11 LAB — CERVICOVAGINAL ANCILLARY ONLY
Bacterial vaginitis: POSITIVE — AB
Candida vaginitis: NEGATIVE
Chlamydia: NEGATIVE
Neisseria Gonorrhea: NEGATIVE
Trichomonas: NEGATIVE

## 2017-09-11 LAB — CYTOLOGY - PAP
DIAGNOSIS: NEGATIVE
HPV (WINDOPATH): NOT DETECTED

## 2017-09-11 LAB — CBG MONITORING, ED: GLUCOSE-CAPILLARY: 131 mg/dL — AB (ref 65–99)

## 2017-09-11 LAB — HEPATITIS B SURFACE ANTIGEN: Hepatitis B Surface Ag: NEGATIVE

## 2017-09-11 LAB — HEPATITIS C ANTIBODY: Hep C Virus Ab: <0.1 s/co ratio (ref 0.0–0.9)

## 2017-09-11 MED ORDER — HYDROCHLOROTHIAZIDE 25 MG PO TABS: 25.0000 mg | ORAL_TABLET | Freq: Every day | ORAL | 0 refills | Status: DC

## 2017-09-11 MED ORDER — SODIUM CHLORIDE 0.9 % IV BOLUS
500.0000 mL | Freq: Once | INTRAVENOUS | Status: AC
  Administered 2017-09-11: 500 mL via INTRAVENOUS

## 2017-09-11 NOTE — Discharge Instructions (Addendum)
Work-up has been reassuring in the emergency department.  I suspect that your syncopal episode today was due to your blood pressure medicine.  Have given you a prescription for 1 of your blood pressure medicines.  I would take this if your blood pressure continues to run high however make sure you are monitoring your blood pressure throughout the day over the next few days and follow-up with the primary care doctor on Monday.  Return to the ED with any worsening symptoms.

## 2017-09-11 NOTE — ED Triage Notes (Signed)
Pt from home with ems for near syncopal episode while sitting on the couch. Pt felt dizzy and was semi-responsive to boyfriend, boyfriend called ems, upon ems arrival pt was pale, diaphoretic and initial Bp was 76/40. After 1L saline bolus pt BP 104/60. Pt a.o upon arrival to ED. No complaints at this time

## 2017-09-11 NOTE — ED Provider Notes (Signed)
[facility] Provider Note   CSN: [id number] Arrival date & time: 09/11/17  1747     History   Chief Complaint Chief Complaint  Patient presents with  . Hypotension  . Near Syncope    HPI Lisa Nash is a 47 y.o. female.  HPI 47 year old Afro-American female with no pertinent past medical history presents to the emergency department today for evaluation of near syncope.  Patient states that she was at home this afternoon and laying on the couch because she did not "feel good".  Patient states that when she went to get up from a lying to a sitting position does tell her boyfriend goodbye her boyfriend states that her eyes rolled the back of her head and semi-responsive.  Patient is unsure of how long the episode lasted for.  EMS was called.  Boyfriend denies any shaking during the episode.  There is no postictal state noted.  Patient was not incontinent of urine.  Did not bite her tongue.  Patient does report prior to this episode having some generalized chest pain throughout the day today.  Chest pain is not exertional.  Not pleuritic in nature.  Did not radiate.  She also reports that yesterday she went to her OB/GYN doctor who noted that she was hypertensive.  No history of hypertension.  Patient was placed on carvedilol and Dyazide.  States that she took the medication for the first time this morning including carvedilol and Dyazide and she is taking her second carvedilol this afternoon approximately 10 minutes prior to her near syncopal episode.  Patient denied any shortness of breath.  She was given fluids in route by EMS and states that this helped her symptoms.  Patient denies any complaints at this time.  She denies any headache, vision changes, nausea, vomiting, abdominal pain.  Nothing makes symptoms better.  She denies any cardiac history.  Denies any DVT/PE, prolonged immobilization, recent hospitalization/surgeries, unilateral leg  swelling or calf tenderness, hemoptysis, current cancer diagnosis, OCP use or hormone therapy.  Patient denies any significant early family cardiac disease.  Patient did not hit her head as she was lying on the couch when this happened.  Pt denies any fever, chill, ha, vision changes, congestion, neck pain,sob, cough, abd pain, n/v/d, urinary symptoms, change in bowel habits, melena, hematochezia, lower extremity paresthesias.      Past Medical History:  Diagnosis Date  . Arthritis   . Asthma   . Eczema     Patient Active Problem List   Diagnosis Date Noted  . Bruising 03/25/2014  . Eczema     History reviewed. No pertinent surgical history.   OB History   None      Home Medications    Prior to Admission medications   Medication Sig Start Date End Date Taking? Authorizing Provider  albuterol (PROVENTIL) (2.5 MG/3ML) 0.083% nebulizer solution U 3 ML VIA NEB TID 08/24/17   [provider]  Biotin 5 MG CAPS Take 5 mg by mouth daily.    [provider]  carvedilol (COREG) 12.5 MG tablet Take 1 tablet (12.5 mg total) by mouth 2 (two) times daily with a meal. 09/10/17   Shelly Bombard, MD  cetirizine (ZYRTEC) 10 MG tablet Take 10 mg by mouth daily.    [provider]  CHANTIX STARTING MONTH PAK 0.5 MG X 11 & 1 MG X 42 tablet TK UTD 08/25/17   [provider]  cyclobenzaprine (FLEXERIL) 10 MG tablet Take  1 tablet (10 mg total) by mouth 2 (two) times daily as needed for muscle spasms. 03/01/14   Etta Quill, NP  diclofenac (VOLTAREN) 50 MG EC tablet Take 1 tablet (50 mg total) by mouth 2 (two) times daily. 05/08/16   Ashley Murrain, NP  doxycycline (VIBRA-TABS) 100 MG tablet Take 100 mg by mouth 2 (two) times daily. 02/21/14   [provider]  fluticasone-salmeterol (ADVAIR HFA) 115-21 MCG/ACT inhaler Inhale 2 puffs into the lungs 2 (two) times daily as needed (shortness of breath).    [provider]  halobetasol (ULTRAVATE) 0.05 %  ointment Apply 1 application topically 2 (two) times daily as needed (allergic reaction, rash).    [provider]  HYDROcodone-homatropine (HYCODAN) 5-1.5 MG/5ML syrup Take 5 mLs by mouth every 6 (six) hours as needed for cough. 07/27/14   Montine Circle, PA-C  hydrOXYzine (ATARAX/VISTARIL) 25 MG tablet Take 1 tablet (25 mg total) by mouth every 6 (six) hours. 09/26/15   Veryl Speak, MD  ibuprofen (ADVIL,MOTRIN) 800 MG tablet Take 1 tablet (800 mg total) by mouth every 8 (eight) hours as needed. 09/10/17   Shelly Bombard, MD  methocarbamol (ROBAXIN) 500 MG tablet Take 1 tablet (500 mg total) by mouth 2 (two) times daily. 03/06/14   Al Corpus, PA-C  methocarbamol (ROBAXIN) 500 MG tablet Take 1 tablet (500 mg total) by mouth 2 (two) times daily. 07/24/14   Palumbo, April, MD  montelukast (SINGULAIR) 10 MG tablet TK 1 T PO QD IN THE EVE 08/24/17   [provider]  predniSONE (DELTASONE) 10 MG tablet Take 3 tablets daily for 3 days, then 2 tablets daily for 3 days, then 1 tablet daily for 3 days 09/26/15   Veryl Speak, MD  QVAR 80 MCG/ACT inhaler Inhale 2 puffs into the lungs 2 (two) times daily. 02/21/14   [provider]  Spacer/Aero-Holding Chambers (AEROCHAMBER PLUS FLO-VU MEDIUM) MISC 1 each by Other route once. Use as directed 02/09/14   Waldemar Dickens, MD  traMADol (ULTRAM) 50 MG tablet Take 1 tablet (50 mg total) by mouth every 6 (six) hours as needed. 07/24/14   Palumbo, April, MD  traMADol (ULTRAM) 50 MG tablet Take 1 tablet (50 mg total) by mouth every 6 (six) hours as needed. 01/09/15   Kirichenko, Tatyana, PA-C  Triamcinolone Acetonide (TRIAMCINOLONE 0.1 % CREAM : EUCERIN) CREA Apply 1 application topically 2 (two) times daily. Disp 1 pound tub 06/13/13   Gregor Hams, MD  triamterene-hydrochlorothiazide (DYAZIDE) 37.5-25 MG capsule Take 1 each (1 capsule total) by mouth daily. 09/10/17   Shelly Bombard, MD  Vitamin D, Ergocalciferol, (DRISDOL) 50000 units  CAPS capsule TK 1 C PO BID FOR 8 WEEKS 08/31/17   [provider]    Family History No family history on file.  Social History Social History   Tobacco Use  . Smoking status: Light Tobacco Smoker    Last attempt to quit: 07/09/2005    Years since quitting: 12.1  . Smokeless tobacco: Never Used  Substance Use Topics  . Alcohol use: Yes    Alcohol/week: 0.6 oz    Types: 1 Glasses of wine per week  . Drug use: No     Allergies   Adhesive [tape] and Pollen extract   Review of Systems Review of Systems  All other systems reviewed and are negative.    Physical Exam Updated Vital Signs BP 117/77   Pulse 81   Temp (!) 97.5 F (36.4 C) (Oral)  Resp 20   Ht 5\' 1"  (1.549 m)   Wt 78.5 kg (173 lb)   LMP 09/03/2017 (Exact Date) Comment: pt shielded  SpO2 98%   BMI 32.69 kg/m   Physical Exam  Constitutional: She is oriented to person, place, and time. She appears well-developed and well-nourished.  Non-toxic appearance. No distress.  HENT:  Head: Normocephalic and atraumatic.  Nose: Nose normal.  Mouth/Throat: Oropharynx is clear and moist.  Eyes: Pupils are equal, round, and reactive to light. Conjunctivae are normal. Right eye exhibits no discharge. Left eye exhibits no discharge.  Neck: Normal range of motion. Neck supple. No JVD present. No tracheal deviation present.  Cardiovascular: Normal rate, regular rhythm, normal heart sounds and intact distal pulses. Exam reveals no gallop and no friction rub.  No murmur heard. Pulmonary/Chest: Effort normal and breath sounds normal. No stridor. No respiratory distress. She has no wheezes. She has no rales. She exhibits no tenderness.  No hypoxia or tachypnea.  Abdominal: Soft. Bowel sounds are normal. She exhibits no distension. There is no tenderness. There is no rebound and no guarding.  Musculoskeletal: Normal range of motion. She exhibits no tenderness.  No lower extremity edema or calf tenderness.    Lymphadenopathy:    She has no cervical adenopathy.  Neurological: She is alert and oriented to person, place, and time.  The patient is alert, attentive, and oriented x 3. Speech is clear. Cranial nerve II-VII grossly intact. Negative pronator drift. Sensation intact. Strength 5/5 in all extremities. Reflexes 2+ and symmetric at biceps, triceps, knees, and ankles. Rapid alternating movement and fine finger movements intact. Posture and gait normal.   Skin: Skin is warm and dry. Capillary refill takes less than 2 seconds. She is not diaphoretic.  Psychiatric: Her behavior is normal. Judgment and thought content normal.  Nursing note and vitals reviewed.    ED Treatments / Results  Labs (all labs ordered are listed, but only abnormal results are displayed) Labs Reviewed  CBC - Abnormal; Notable for the following components:      Result Value   Hemoglobin 11.9 (*)    HCT 35.2 (*)    All other components within normal limits  CBG MONITORING, ED - Abnormal; Notable for the following components:   Glucose-Capillary 131 (*)    All other components within normal limits  BASIC METABOLIC PANEL  URINALYSIS, ROUTINE W REFLEX MICROSCOPIC  TSH  I-STAT BETA HCG BLOOD, ED ([facility], [facility], [facility] ONLY)  I-STAT TROPONIN, ED    EKG EKG Interpretation  Date/Time:  Friday Sep 11 2017 18:01:35 EDT Ventricular Rate:  81 PR Interval:    QRS Duration: 81 QT Interval:  416 QTC Calculation: 483 R Axis:   70 Text Interpretation:  Sinus rhythm Borderline T wave abnormalities No significant change since last tracing Confirmed by Addison Lank [phone]) on 09/11/2017 6:23:43 PM   Radiology Dg Chest 2 View  Result Date: 09/11/2017 CLINICAL DATA:  Syncope, hypertension EXAM: CHEST - 2 VIEW COMPARISON:  07/29/2014 FINDINGS: Lungs are clear.  No pleural effusion or pneumothorax. The heart is normal in size. Visualized osseous structures are within normal limits. IMPRESSION: Normal chest radiographs. Electronically  Signed   By: Julian Hy M.D.   On: 09/11/2017 20:08    Procedures Procedures (including critical care time)  Medications Ordered in ED Medications  sodium chloride 0.9 % bolus 500 mL (0 mLs Intravenous Stopped 09/12/17 0012)     Initial Impression / Assessment and Plan / ED Course  I have  reviewed the triage vital signs and the nursing notes.  Pertinent labs & imaging results that were available during my care of the patient were reviewed by me and considered in my medical decision making (see chart for details).     Patient presents to the ED for evaluation following syncopal episode with hypotension.  Patient given fluids in route by EMS with improvement in her symptoms.  Denies any complaints on my examination.  Patient does report starting 3 new blood pressure medicine today for the first time.  Vital signs reassuring on my examination.  Patient is not hypotensive.  No tachycardia noted.  She is afebrile with normal respirations and saturations.  On exam heart regular rate and rhythm without any rubs murmurs or gallops.  Neurovascularly intact in all extremities.  Lungs clear to auscultation bilaterally.  No focal neuro deficit noted on my exam.  No focal abdominal tenderness.  Lab work reassuring.  No leukocytosis.  Hemoglobin is 11.9 unsure of baseline.  Negative beta hCG.  Electrolytes are reassuring.  Normal kidney function.  TSH is pending.  Normal glucose.  UA shows no signs of infection.  Troponin was negative.  TSH is pending.  Chest x-ray showed no signs of acute findings as reviewed by myself.  EKG with normal sinus rhythm with no signs of acute ischemia and appears similar to prior tracings.  Patient was not orthostatic when she arrived to the ED.  Patient received a total of 1.5 L of normal saline bolus.  Her blood pressures have remained stable in the ED.  Patient has been able to ambulate without any dizziness, lightheadedness, syncope.  She denies any chest pain  or shortness of breath at this time.  Clinical presentation does not seem consistent with ACS.  Patient is PERC negative.  Doubt PE.  Doubt dissection or pneumonia.  No arrhythmia noted on EKG.  No focal neuro deficit and doubt CVA.  No infectious symptoms and doubt sepsis.  I suspect the patient's symptoms including hypotension and near syncopal episode was likely secondary to recently starting 3 new blood pressure medications this morning.  Patient symptoms improved with fluid bolus.  Have given patient prescription for just HCTZ to take.  Have encouraged her to monitor her blood pressure very closely in the next few days and to follow-up with her primary care doctor on Monday.  Discussed return precautions with patient.  Pt is hemodynamically stable, in NAD, & able to ambulate in the ED. Evaluation does not show pathology that would require ongoing emergent intervention or inpatient treatment. I explained the diagnosis to the patient. Pain has been managed & has no complaints prior to dc. Pt is comfortable with above plan and is stable for discharge at this time. All questions were answered prior to disposition. Strict return precautions for f/u to the ED were discussed. Encouraged follow up with PCP.  Patient was discussed with my attending who was agreeable with the above plan.   Final Clinical Impressions(s) / ED Diagnoses   Final diagnoses:  Near syncope  Chest pain, unspecified type  Hypotension, unspecified hypotension type    ED Discharge Orders        Ordered    hydrochlorothiazide (HYDRODIURIL) 25 MG tablet  Daily     09/11/17 2327       Doristine Devoid, PA-C 09/12/17 0146    Fatima Blank, MD 09/13/17 [phone]

## 2017-09-11 NOTE — ED Notes (Signed)
Ambulated Patient to and from bathroom. Patient ambulated with steady gait throughout. PA Leaphart made aware.

## 2017-09-12 ENCOUNTER — Other Ambulatory Visit: Payer: Self-pay | Admitting: Obstetrics

## 2017-09-12 DIAGNOSIS — N760 Acute vaginitis: Principal | ICD-10-CM

## 2017-09-12 DIAGNOSIS — B9689 Other specified bacterial agents as the cause of diseases classified elsewhere: Secondary | ICD-10-CM

## 2017-09-12 MED ORDER — SECNIDAZOLE 2 G PO PACK: 1.0000 | PACK | Freq: Once | ORAL | 2 refills | Status: AC

## 2017-09-17 ENCOUNTER — Other Ambulatory Visit: Payer: Self-pay | Admitting: Obstetrics

## 2017-09-17 ENCOUNTER — Ambulatory Visit (HOSPITAL_COMMUNITY)
Admission: RE | Admit: 2017-09-17 | Discharge: 2017-09-17 | Disposition: A | Discharge status: Home / Self Care | Payer: Medicaid Other | Source: Ambulatory Visit | Attending: Obstetrics | Admitting: Obstetrics

## 2017-09-17 DIAGNOSIS — D219 Benign neoplasm of connective and other soft tissue, unspecified: Secondary | ICD-10-CM | POA: Diagnosis not present

## 2017-09-17 DIAGNOSIS — D259 Leiomyoma of uterus, unspecified: Secondary | ICD-10-CM | POA: Insufficient documentation

## 2017-09-24 ENCOUNTER — Ambulatory Visit (INDEPENDENT_AMBULATORY_CARE_PROVIDER_SITE_OTHER): Payer: Medicaid Other | Admitting: Obstetrics

## 2017-09-24 ENCOUNTER — Encounter: Payer: Self-pay | Admitting: Obstetrics

## 2017-09-24 VITALS — BP 131/89 | HR 93 | Ht 61.0 in | Wt 173.2 lb

## 2017-09-24 DIAGNOSIS — D219 Benign neoplasm of connective and other soft tissue, unspecified: Secondary | ICD-10-CM | POA: Diagnosis not present

## 2017-09-24 DIAGNOSIS — N393 Stress incontinence (female) (male): Secondary | ICD-10-CM | POA: Diagnosis not present

## 2017-09-24 DIAGNOSIS — N946 Dysmenorrhea, unspecified: Secondary | ICD-10-CM | POA: Diagnosis not present

## 2017-09-24 NOTE — Progress Notes (Signed)
Pt is in the office for U/S results from 09-17-17. Pt also stated that she does not take BP med prescribed by our office because, her PCP switched her to another.

## 2017-09-24 NOTE — Progress Notes (Signed)
Patient ID: Lisa Nash, female   DOB: November 14, 1970, 47 y.o.   MRN: [id number]  Chief Complaint  Patient presents with  . Follow-up    HPI Lisa Nash is a 47 y.o. female.  History of uterine fibroids.  Presents for ultrasound results. HPI  Past Medical History:  Diagnosis Date  . Arthritis   . Asthma   . Eczema     History reviewed. No pertinent surgical history.  History reviewed. No pertinent family history.  Social History Social History   Tobacco Use  . Smoking status: Light Tobacco Smoker    Last attempt to quit: 07/09/2005    Years since quitting: 12.2  . Smokeless tobacco: Never Used  Substance Use Topics  . Alcohol use: Yes    Alcohol/week: 0.6 oz    Types: 1 Glasses of wine per week  . Drug use: No    Allergies  Allergen Reactions  . Adhesive [Tape]   . Pollen Extract     Current Outpatient Medications  Medication Sig Dispense Refill  . albuterol (PROVENTIL) (2.5 MG/3ML) 0.083% nebulizer solution U 3 ML VIA NEB TID  2  . Biotin 5 MG CAPS Take 5 mg by mouth daily.    . cetirizine (ZYRTEC) 10 MG tablet Take 10 mg by mouth daily.    . fluticasone-salmeterol (ADVAIR HFA) 115-21 MCG/ACT inhaler Inhale 2 puffs into the lungs 2 (two) times daily as needed (shortness of breath).    . montelukast (SINGULAIR) 10 MG tablet TK 1 T PO QD IN THE EVE  5  . QVAR 80 MCG/ACT inhaler Inhale 2 puffs into the lungs 2 (two) times daily.  0  . Spacer/Aero-Holding Chambers (AEROCHAMBER PLUS FLO-VU MEDIUM) MISC 1 each by Other route once. Use as directed 1 each 1  . triamterene-hydrochlorothiazide (DYAZIDE) 37.5-25 MG capsule Take 1 each (1 capsule total) by mouth daily. 30 capsule 11  . Vitamin D, Ergocalciferol, (DRISDOL) 50000 units CAPS capsule TK 1 C PO BID FOR 8 WEEKS  2  . carvedilol (COREG) 12.5 MG tablet Take 1 tablet (12.5 mg total) by mouth 2 (two) times daily with a meal. (Patient not taking: Reported on 09/24/2017) 60 tablet 11  . CHANTIX STARTING MONTH  PAK 0.5 MG X 11 & 1 MG X 42 tablet TK UTD  0  . cyclobenzaprine (FLEXERIL) 10 MG tablet Take 1 tablet (10 mg total) by mouth 2 (two) times daily as needed for muscle spasms. (Patient not taking: Reported on 09/24/2017) 20 tablet 0  . diclofenac (VOLTAREN) 50 MG EC tablet Take 1 tablet (50 mg total) by mouth 2 (two) times daily. 15 tablet 0  . doxycycline (VIBRA-TABS) 100 MG tablet Take 100 mg by mouth 2 (two) times daily.  0  . halobetasol (ULTRAVATE) 0.05 % ointment Apply 1 application topically 2 (two) times daily as needed (allergic reaction, rash).    . hydrochlorothiazide (HYDRODIURIL) 25 MG tablet Take 1 tablet (25 mg total) by mouth daily. (Patient not taking: Reported on 09/24/2017) 30 tablet 0  . HYDROcodone-homatropine (HYCODAN) 5-1.5 MG/5ML syrup Take 5 mLs by mouth every 6 (six) hours as needed for cough. (Patient not taking: Reported on 09/24/2017) 120 mL 0  . hydrOXYzine (ATARAX/VISTARIL) 25 MG tablet Take 1 tablet (25 mg total) by mouth every 6 (six) hours. (Patient not taking: Reported on 09/24/2017) 15 tablet 0  . ibuprofen (ADVIL,MOTRIN) 800 MG tablet Take 1 tablet (800 mg total) by mouth every 8 (eight) hours as needed. (Patient not taking: Reported  on 09/24/2017) 30 tablet 5  . methocarbamol (ROBAXIN) 500 MG tablet Take 1 tablet (500 mg total) by mouth 2 (two) times daily. 20 tablet 0  . methocarbamol (ROBAXIN) 500 MG tablet Take 1 tablet (500 mg total) by mouth 2 (two) times daily. (Patient not taking: Reported on 09/24/2017) 20 tablet 0  . predniSONE (DELTASONE) 10 MG tablet Take 3 tablets daily for 3 days, then 2 tablets daily for 3 days, then 1 tablet daily for 3 days (Patient not taking: Reported on 09/24/2017) 18 tablet 0  . traMADol (ULTRAM) 50 MG tablet Take 1 tablet (50 mg total) by mouth every 6 (six) hours as needed. (Patient not taking: Reported on 09/24/2017) 9 tablet 0  . traMADol (ULTRAM) 50 MG tablet Take 1 tablet (50 mg total) by mouth every 6 (six) hours as needed. (Patient  not taking: Reported on 09/24/2017) 15 tablet 0  . Triamcinolone Acetonide (TRIAMCINOLONE 0.1 % CREAM : EUCERIN) CREA Apply 1 application topically 2 (two) times daily. Disp 1 pound tub (Patient not taking: Reported on 09/24/2017) 1 each 2   No current facility-administered medications for this visit.     Review of Systems Review of Systems Constitutional: negative for fatigue and weight loss Respiratory: negative for cough and wheezing Cardiovascular: negative for chest pain, fatigue and palpitations Gastrointestinal: negative for abdominal pain and change in bowel habits Genitourinary:negative Integument/breast: negative for nipple discharge Musculoskeletal:negative for myalgias Neurological: negative for gait problems and tremors Behavioral/Psych: negative for abusive relationship, depression Endocrine: negative for temperature intolerance      Blood pressure 131/89, pulse 93, height 5\' 1"  (1.549 m), weight 173 lb 3.2 oz (78.6 kg), last menstrual period 09/03/2017.  Physical Exam Physical Exam General:   alert  Skin:   no rash or abnormalities  Lungs:   clear to auscultation bilaterally  Heart:   regular rate and rhythm, S1, S2 normal, no murmur, click, rub or gallop  Breasts:   normal without suspicious masses, skin or nipple changes or axillary nodes  Abdomen:  normal findings: no organomegaly, soft, non-tender and no hernia  Pelvis:  External genitalia: normal general appearance Urinary system: urethral meatus normal and bladder without fullness, nontender Vaginal: normal without tenderness, induration or masses Cervix: normal appearance Adnexa: normal bimanual exam Uterus: anteverted and non-tender, normal size    >50% of 15 min visit spent on counseling and coordination of care.   Data Reviewed Ultrasound: US PELVIC COMPLETE WITH TRANSVAGINAL (Accession [id number]) (Order [id number])  Imaging  Date: 09/17/2017 Department: [facility]  Released By: Adelene Amas, NT Authorizing: Shelly Bombard, MD  Exam Information   Status Exam Begun  Exam Ended   Final [99] 09/17/2017 10:23 AM 09/17/2017 11:06 AM  PACS Images   Show images for US PELVIC COMPLETE WITH TRANSVAGINAL  Study Result   CLINICAL DATA:  Fibroids.  LMP 09/03/2017.  EXAM: TRANSABDOMINAL AND TRANSVAGINAL ULTRASOUND OF PELVIS  TECHNIQUE: Both transabdominal and transvaginal ultrasound examinations of the pelvis were performed. Transabdominal technique was performed for global imaging of the pelvis including uterus, ovaries, adnexal regions, and pelvic cul-de-sac. It was necessary to proceed with endovaginal exam following the transabdominal exam to visualize the uterus, endometrium, ovaries, and adnexal regions.  COMPARISON:  08/03/2015  FINDINGS: Uterus  Measurements: The uterus is enlarged, measuring at least 14.6 x 6.8 x 8.3 centimeters. Numerous fibroids are measured:  LEFT anterior fundal subserosal in fibroid is 4.4 x 3.8 x 3.3 centimeters.  LEFT anterior myometrial fibroid is  4.6 x 4.0 x 4.4 centimeters.  Posterior myometrial fibroid is 3.4 x 2.6 x 2.4 centimeters.  Posterior fundal myometrial fibroid is 2.9 x 2.6 x 2.8 centimeters.  Endometrium  Thickness: 13 millimeters.  Heterogeneous in appearance.  Right ovary  Measurements: 3.7 x 2.4 x 1.5 centimeter. Normal appearance/no adnexal mass.  Left ovary  Measurements: 4.0 x 1.4 x 1.9 centimeters. Normal appearance/no adnexal mass.  Other findings  No abnormal free fluid.  IMPRESSION: 1. Enlarged uterus containing numerous fibroids. Largest is in the fundal region measuring 4.6 centimeters. 2. Mildly heterogeneous endometrium is normal in thickness. 3. Normal appearance of both ovaries.   Electronically Signed   By: Nolon Nations M.D.   On: 09/17/2017 12:36     Assessment     1. Fibroids - ultrasound reveals increased size of fibroids  over past 2 years  2. Dysmenorrhea - Ibuprofen prn  3. SUI (stress urinary incontinence, female) - referred to [facility]    Follow up prn   No orders of the defined types were placed in this encounter.  No orders of the defined types were placed in this encounter.   Shelly Bombard MD 09-24-2017

## 2018-07-05 ENCOUNTER — Other Ambulatory Visit: Payer: Self-pay | Admitting: Internal Medicine

## 2018-07-05 DIAGNOSIS — Z1231 Encounter for screening mammogram for malignant neoplasm of breast: Secondary | ICD-10-CM

## 2018-07-07 ENCOUNTER — Encounter: Payer: Self-pay | Admitting: Internal Medicine

## 2018-07-07 ENCOUNTER — Ambulatory Visit: Payer: BLUE CROSS/BLUE SHIELD | Admitting: Internal Medicine

## 2018-07-07 VITALS — BP 148/88 | HR 97 | Temp 98.5°F | Ht 61.0 in | Wt 180.0 lb

## 2018-07-07 DIAGNOSIS — E6609 Other obesity due to excess calories: Secondary | ICD-10-CM

## 2018-07-07 DIAGNOSIS — J4541 Moderate persistent asthma with (acute) exacerbation: Secondary | ICD-10-CM

## 2018-07-07 DIAGNOSIS — E66811 Obesity, class 1: Secondary | ICD-10-CM

## 2018-07-07 DIAGNOSIS — F17200 Nicotine dependence, unspecified, uncomplicated: Secondary | ICD-10-CM | POA: Diagnosis not present

## 2018-07-07 DIAGNOSIS — J0100 Acute maxillary sinusitis, unspecified: Secondary | ICD-10-CM | POA: Diagnosis not present

## 2018-07-07 DIAGNOSIS — Z6834 Body mass index (BMI) 34.0-34.9, adult: Secondary | ICD-10-CM

## 2018-07-07 DIAGNOSIS — I10 Essential (primary) hypertension: Secondary | ICD-10-CM

## 2018-07-07 DIAGNOSIS — Z111 Encounter for screening for respiratory tuberculosis: Secondary | ICD-10-CM

## 2018-07-07 MED ORDER — HYDROCODONE-HOMATROPINE 5-1.5 MG/5ML PO SYRP: 5.0000 mL | ORAL_SOLUTION | Freq: Four times a day (QID) | ORAL | 0 refills | Status: DC | PRN

## 2018-07-07 MED ORDER — CEFTRIAXONE SODIUM 500 MG IJ SOLR
500.0000 mg | Freq: Once | INTRAMUSCULAR | Status: AC
  Administered 2018-07-07: 500 mg via INTRAMUSCULAR

## 2018-07-07 MED ORDER — ALBUTEROL SULFATE (2.5 MG/3ML) 0.083% IN NEBU
2.5000 mg | INHALATION_SOLUTION | Freq: Once | RESPIRATORY_TRACT | Status: AC
  Administered 2018-07-07: 2.5 mg via RESPIRATORY_TRACT

## 2018-07-07 MED ORDER — TRIAMCINOLONE ACETONIDE 40 MG/ML IJ SUSP
40.0000 mg | Freq: Once | INTRAMUSCULAR | Status: AC
  Administered 2018-07-07: 40 mg via INTRAMUSCULAR

## 2018-07-07 MED ORDER — AZITHROMYCIN 250 MG PO TABS: ORAL_TABLET | ORAL | 0 refills | Status: AC

## 2018-07-09 LAB — CMP14+EGFR
ALK PHOS: 64 IU/L (ref 39–117)
ALT: 21 IU/L (ref 0–32)
AST: 24 IU/L (ref 0–40)
Albumin/Globulin Ratio: 1.3 (ref 1.2–2.2)
Albumin: 4 g/dL (ref 3.8–4.8)
BUN/Creatinine Ratio: 7 — ABNORMAL LOW (ref 9–23)
BUN: 6 mg/dL (ref 6–24)
Bilirubin Total: <0.2 mg/dL (ref 0.0–1.2)
CALCIUM: 9 mg/dL (ref 8.7–10.2)
CO2: 20 mmol/L (ref 20–29)
CREATININE: 0.86 mg/dL (ref 0.57–1.00)
Chloride: 104 mmol/L (ref 96–106)
GFR calc Af Amer: 93 mL/min/{1.73_m2} (ref >59)
GFR, EST NON AFRICAN AMERICAN: 81 mL/min/{1.73_m2} (ref >59)
GLOBULIN, TOTAL: 3.1 g/dL (ref 1.5–4.5)
GLUCOSE: 76 mg/dL (ref 65–99)
Potassium: 4.2 mmol/L (ref 3.5–5.2)
SODIUM: 140 mmol/L (ref 134–144)
Total Protein: 7.1 g/dL (ref 6.0–8.5)

## 2018-07-09 LAB — CBC WITH DIFFERENTIAL/PLATELET
BASOS: 1 %
Basophils Absolute: 0 10*3/uL (ref 0.0–0.2)
EOS (ABSOLUTE): 0.1 10*3/uL (ref 0.0–0.4)
EOS: 1 %
HEMATOCRIT: 38.5 % (ref 34.0–46.6)
HEMOGLOBIN: 12.7 g/dL (ref 11.1–15.9)
IMMATURE GRANS (ABS): 0 10*3/uL (ref 0.0–0.1)
IMMATURE GRANULOCYTES: 0 %
LYMPHS: 24 %
Lymphocytes Absolute: 1.3 10*3/uL (ref 0.7–3.1)
MCH: 30.9 pg (ref 26.6–33.0)
MCHC: 33 g/dL (ref 31.5–35.7)
MCV: 94 fL (ref 79–97)
MONOCYTES: 12 %
MONOS ABS: 0.6 10*3/uL (ref 0.1–0.9)
NEUTROS PCT: 62 %
Neutrophils Absolute: 3.2 10*3/uL (ref 1.4–7.0)
Platelets: 290 10*3/uL (ref 150–450)
RBC: 4.11 x10E6/uL (ref 3.77–5.28)
RDW: 12.9 % (ref 11.7–15.4)
WBC: 5.2 10*3/uL (ref 3.4–10.8)

## 2018-07-09 LAB — QUANTIFERON-TB GOLD PLUS
QuantiFERON Mitogen Value: >10 IU/mL
QuantiFERON Nil Value: 0.09 IU/mL
QuantiFERON TB1 Ag Value: 0.1 IU/mL
QuantiFERON TB2 Ag Value: 0.1 IU/mL
QuantiFERON-TB Gold Plus: NEGATIVE

## 2018-07-13 ENCOUNTER — Other Ambulatory Visit: Payer: Self-pay

## 2018-07-13 MED ORDER — PREDNISONE 10 MG (21) PO TBPK: 10.0000 mg | ORAL_TABLET | Freq: Every day | ORAL | 0 refills | Status: DC

## 2018-07-14 ENCOUNTER — Other Ambulatory Visit: Payer: Self-pay

## 2018-08-01 NOTE — Progress Notes (Signed)
Subjective:     Patient ID: Lisa Nash , female    DOB: Mar 13, 1971 , 48 y.o.   MRN: [id number]   Chief Complaint  Patient presents with  . Cough    congestion,coughing, no fever     HPI  Cough  This is a recurrent problem. The current episode started in the past 7 days. The problem has been gradually worsening. The problem occurs every few minutes. The cough is non-productive. Associated symptoms include wheezing. Pertinent negatives include no nasal congestion, sore throat or shortness of breath. The symptoms are aggravated by lying down. Risk factors for lung disease include smoking/tobacco exposure. She has tried a beta-agonist inhaler for the symptoms. The treatment provided no relief.     Past Medical History:  Diagnosis Date  . Arthritis   . Asthma   . Eczema      History reviewed. No pertinent family history.   Current Outpatient Medications:  .  albuterol (PROVENTIL) (2.5 MG/3ML) 0.083% nebulizer solution, U 3 ML VIA NEB TID, Disp: , Rfl: 2 .  Biotin 5 MG CAPS, Take 5 mg by mouth daily., Disp: , Rfl:  .  cetirizine (ZYRTEC) 10 MG tablet, Take 10 mg by mouth daily., Disp: , Rfl:  .  CHANTIX STARTING MONTH PAK 0.5 MG X 11 & 1 MG X 42 tablet, TK UTD, Disp: , Rfl: 0 .  halobetasol (ULTRAVATE) 0.05 % ointment, Apply 1 application topically 2 (two) times daily as needed (allergic reaction, rash)., Disp: , Rfl:  .  hydrochlorothiazide (HYDRODIURIL) 25 MG tablet, Take 1 tablet (25 mg total) by mouth daily., Disp: 30 tablet, Rfl: 0 .  hydrOXYzine (ATARAX/VISTARIL) 25 MG tablet, Take 1 tablet (25 mg total) by mouth every 6 (six) hours., Disp: 15 tablet, Rfl: 0 .  ibuprofen (ADVIL,MOTRIN) 800 MG tablet, Take 1 tablet (800 mg total) by mouth every 8 (eight) hours as needed., Disp: 30 tablet, Rfl: 5 .  montelukast (SINGULAIR) 10 MG tablet, TK 1 T PO QD IN THE EVE, Disp: , Rfl: 5 .  QVAR 80 MCG/ACT inhaler, Inhale 2 puffs into the lungs 2 (two) times daily., Disp: , Rfl: 0 .   Spacer/Aero-Holding Chambers (AEROCHAMBER PLUS FLO-VU MEDIUM) MISC, 1 each by Other route once. Use as directed, Disp: 1 each, Rfl: 1 .  Triamcinolone Acetonide (TRIAMCINOLONE 0.1 % CREAM : EUCERIN) CREA, Apply 1 application topically 2 (two) times daily. Disp 1 pound tub, Disp: 1 each, Rfl: 2 .  Vitamin D, Ergocalciferol, (DRISDOL) 50000 units CAPS capsule, TK 1 C PO BID FOR 8 WEEKS, Disp: , Rfl: 2 .  HYDROcodone-homatropine (HYDROMET) 5-1.5 MG/5ML syrup, Take 5 mLs by mouth every 6 (six) hours as needed., Disp: 120 mL, Rfl: 0 .  predniSONE (STERAPRED UNI-PAK 21 TAB) 10 MG (21) TBPK tablet, Take 1 tablet (10 mg total) by mouth daily. Take as directed, Disp: 21 tablet, Rfl: 0   Allergies  Allergen Reactions  . Adhesive [Tape]   . Pollen Extract      Review of Systems  Constitutional: Negative.   HENT: Negative for sore throat.   Respiratory: Positive for cough and wheezing. Negative for shortness of breath.   Cardiovascular: Negative.   Gastrointestinal: Negative.   Neurological: Negative.   Psychiatric/Behavioral: Negative.      Today's Vitals   07/07/18 1144  BP: (!) 148/88  Pulse: 97  Temp: 98.5 F (36.9 C)  TempSrc: Oral  SpO2: 97%  Weight: 180 lb (81.6 kg)  Height: _0  (1.549 m)  Body mass index is 34.01 kg/m.   Objective:  Physical Exam Vitals signs and nursing note reviewed.  Constitutional:      Appearance: Normal appearance.  HENT:     Head: Normocephalic and atraumatic.     Comments: B/l maxillary sinusitis to percussion    Right Ear: Tympanic membrane, ear canal and external ear normal.     Left Ear: Tympanic membrane, ear canal and external ear normal.     Mouth/Throat:     Mouth: Mucous membranes are moist.     Pharynx: Oropharynx is clear. Posterior oropharyngeal erythema present.  Cardiovascular:     Rate and Rhythm: Normal rate and regular rhythm.     Heart sounds: Normal heart sounds.  Pulmonary:     Effort: Pulmonary effort is normal.      Breath sounds: Wheezing and rhonchi present.  Skin:    General: Skin is warm.  Neurological:     General: No focal deficit present.     Mental Status: She is alert.  Psychiatric:        Mood and Affect: Mood normal.        Behavior: Behavior normal.         Assessment And Plan:     1. Moderate persistent asthma with exacerbation  She was given albuterol nebulizer treatment with improvement of her breath sounds. She was also given kenalog 8m IM x 1.   - albuterol (PROVENTIL) (2.5 MG/3ML) 0.083% nebulizer solution 2.5 mg - triamcinolone acetonide (KENALOG-40) injection 40 mg  2. Acute non-recurrent maxillary sinusitis  She was given injection as listed below and rx zpak. She is encouraged to take full course of Abx.   - cefTRIAXone (ROCEPHIN) injection 500 mg - CBC with Diff  3. Essential hypertension, benign  Uncontrolled. She will continue with current meds. Likely exacerbated by her acute illness.   - CMP14+EGFR  4. Smoker  She is encouraged to quit smoking. She is not ready to quit at this time.   5. Class 1 obesity due to excess calories without serious comorbidity with body mass index (BMI) of 34.0 to 34.9 in adult  She is encouraged to strive for BMI less than 30.  Importance of achieving optimal weight to decrease risk of cardiovascular disease and cancers was discussed with the patient in full detail. She is encouraged to start slowly - start with 10 minutes twice daily at least three to four days per week and to gradually build to 30 minutes five days weekly. She was given tips to incorporate more activity into her daily routine - take stairs when possible, park farther away from her job, grocery stores, etc.      6. Tuberculosis screening  - QuantiFERON-TB Gold Plus     RMaximino Greenland MD

## 2018-08-04 ENCOUNTER — Inpatient Hospital Stay: Admission: RE | Admit: 2018-08-04 | Payer: Self-pay | Source: Ambulatory Visit

## 2018-09-01 ENCOUNTER — Ambulatory Visit: Payer: Self-pay

## 2018-09-03 ENCOUNTER — Other Ambulatory Visit: Payer: Self-pay | Admitting: Internal Medicine

## 2018-09-03 DIAGNOSIS — J45909 Unspecified asthma, uncomplicated: Secondary | ICD-10-CM

## 2018-09-20 ENCOUNTER — Encounter: Payer: Self-pay | Admitting: Internal Medicine

## 2018-10-12 ENCOUNTER — Ambulatory Visit: Payer: Self-pay

## 2018-10-18 ENCOUNTER — Encounter: Payer: Self-pay | Admitting: Internal Medicine

## 2018-10-19 ENCOUNTER — Ambulatory Visit (INDEPENDENT_AMBULATORY_CARE_PROVIDER_SITE_OTHER): Payer: BLUE CROSS/BLUE SHIELD | Admitting: Internal Medicine

## 2018-10-19 ENCOUNTER — Other Ambulatory Visit: Payer: Self-pay

## 2018-10-19 ENCOUNTER — Encounter: Payer: Self-pay | Admitting: Internal Medicine

## 2018-10-19 ENCOUNTER — Telehealth: Payer: Self-pay | Admitting: *Deleted

## 2018-10-19 ENCOUNTER — Telehealth: Payer: Self-pay

## 2018-10-19 VITALS — BP 143/88 | HR 96 | Temp 98.4°F | Ht 61.0 in | Wt 184.4 lb

## 2018-10-19 DIAGNOSIS — L309 Dermatitis, unspecified: Secondary | ICD-10-CM

## 2018-10-19 DIAGNOSIS — R05 Cough: Secondary | ICD-10-CM

## 2018-10-19 DIAGNOSIS — J4521 Mild intermittent asthma with (acute) exacerbation: Secondary | ICD-10-CM | POA: Diagnosis not present

## 2018-10-19 DIAGNOSIS — R030 Elevated blood-pressure reading, without diagnosis of hypertension: Secondary | ICD-10-CM | POA: Diagnosis not present

## 2018-10-19 DIAGNOSIS — R059 Cough, unspecified: Secondary | ICD-10-CM

## 2018-10-19 DIAGNOSIS — Z20828 Contact with and (suspected) exposure to other viral communicable diseases: Secondary | ICD-10-CM

## 2018-10-19 DIAGNOSIS — Z20822 Contact with and (suspected) exposure to covid-19: Secondary | ICD-10-CM

## 2018-10-19 MED ORDER — QVAR 80 MCG/ACT IN AERS
2.0000 | INHALATION_SPRAY | Freq: Two times a day (BID) | RESPIRATORY_TRACT | 5 refills | Status: DC
Start: 2018-10-19 — End: 2019-11-02

## 2018-10-19 MED ORDER — HALOBETASOL PROPIONATE 0.05 % EX OINT: 1.0000 "application " | TOPICAL_OINTMENT | Freq: Two times a day (BID) | CUTANEOUS | 0 refills | Status: DC | PRN

## 2018-10-19 NOTE — Telephone Encounter (Signed)
Already scheduled, see other encounter.

## 2018-10-19 NOTE — Telephone Encounter (Signed)
Patient returned call for scheduling, appointment scheduled for tomorrow, 10/20/18 at 0830 at [facility], advised of location and to wear a mask for everyone in the vehicle, she verbalized understanding. Order placed.

## 2018-10-19 NOTE — Telephone Encounter (Signed)
Left message for pt to return call to be scheduled for testing

## 2018-10-19 NOTE — Patient Instructions (Signed)
Asthma, Adult    Asthma is a long-term (chronic) condition in which the airways get tight and narrow. The airways are the breathing passages that lead from the nose and mouth down into the lungs. A person with asthma will have times when symptoms get worse. These are called asthma attacks. They can cause coughing, whistling sounds when you breathe (wheezing), shortness of breath, and chest pain. They can make it hard to breathe. There is no cure for asthma, but medicines and lifestyle changes can help control it.  There are many things that can bring on an asthma attack or make asthma symptoms worse (triggers). Common triggers include:  · Mold.  · Dust.  · Cigarette smoke.  · Cockroaches.  · Things that can cause allergy symptoms (allergens). These include animal skin flakes (dander) and pollen from trees or grass.  · Things that pollute the air. These may include household cleaners, wood smoke, smog, or chemical odors.  · Cold air, weather changes, and wind.  · Crying or laughing hard.  · Stress.  · Certain medicines or drugs.  · Certain foods such as dried fruit, potato chips, and grape juice.  · Infections, such as a cold or the flu.  · Certain medical conditions or diseases.  · Exercise or tiring activities.  Asthma may be treated with medicines and by staying away from the things that cause asthma attacks. Types of medicines may include:  · Controller medicines. These help prevent asthma symptoms. They are usually taken every day.  · Fast-acting reliever or rescue medicines. These quickly relieve asthma symptoms. They are used as needed and provide short-term relief.  · Allergy medicines if your attacks are brought on by allergens.  · Medicines to help control the body's defense (immune) system.  Follow these instructions at home:  Avoiding triggers in your home  · Change your heating and air conditioning filter often.  · Limit your use of fireplaces and wood stoves.  · Get rid of pests (such as roaches and  mice) and their droppings.  · Throw away plants if you see mold on them.  · Clean your floors. Dust regularly. Use cleaning products that do not smell.  · Have someone vacuum when you are not home. Use a vacuum cleaner with a HEPA filter if possible.  · Replace carpet with wood, tile, or vinyl flooring. Carpet can trap animal skin flakes and dust.  · Use allergy-proof pillows, mattress covers, and box spring covers.  · Wash bed sheets and blankets every week in hot water. Dry them in a dryer.  · Keep your bedroom free of any triggers.   · Avoid pets and keep windows closed when things that cause allergy symptoms are in the air.  · Use blankets that are made of polyester or cotton.  · Clean bathrooms and kitchens with bleach. If possible, have someone repaint the walls in these rooms with mold-resistant paint. Keep out of the rooms that are being cleaned and painted.  · Wash your hands often with soap and water. If soap and water are not available, use hand sanitizer.  · Do not allow anyone to smoke in your home.  General instructions  · Take over-the-counter and prescription medicines only as told by your doctor.  ? Talk with your doctor if you have questions about how or when to take your medicines.  ? Make note if you need to use your medicines more often than usual.  · Do not use any products that   contain nicotine or tobacco, such as cigarettes and e-cigarettes. If you need help quitting, ask your doctor.  · Stay away from secondhand smoke.  · Avoid doing things outdoors when allergen counts are high and when air quality is low.  · Wear a ski mask when doing outdoor activities in the winter. The mask should cover your nose and mouth. Exercise indoors on cold days if you can.  · Warm up before you exercise. Take time to cool down after exercise.  · Use a peak flow meter as told by your doctor. A peak flow meter is a tool that measures how well the lungs are working.  · Keep track of the peak flow meter's readings.  Write them down.  · Follow your asthma action plan. This is a written plan for taking care of your asthma and treating your attacks.  · Make sure you get all the shots (vaccines) that your doctor recommends. Ask your doctor about a flu shot and a pneumonia shot.  · Keep all follow-up visits as told by your doctor. This is important.  Contact a doctor if:  · You have wheezing, shortness of breath, or a cough even while taking medicine to prevent attacks.  · The mucus you cough up (sputum) is thicker than usual.  · The mucus you cough up changes from clear or white to yellow, green, gray, or bloody.  · You have problems from the medicine you are taking, such as:  ? A rash.  ? Itching.  ? Swelling.  ? Trouble breathing.  · You need reliever medicines more than 2-3 times a week.  · Your peak flow reading is still at 50-79% of your personal best after following the action plan for 1 hour.  · You have a fever.  Get help right away if:  · You seem to be worse and are not responding to medicine during an asthma attack.  · You are short of breath even at rest.  · You get short of breath when doing very little activity.  · You have trouble eating, drinking, or talking.  · You have chest pain or tightness.  · You have a fast heartbeat.  · Your lips or fingernails start to turn blue.  · You are light-headed or dizzy, or you faint.  · Your peak flow is less than 50% of your personal best.  · You feel too tired to breathe normally.  Summary  · Asthma is a long-term (chronic) condition in which the airways get tight and narrow. An asthma attack can make it hard to breathe.  · Asthma cannot be cured, but medicines and lifestyle changes can help control it.  · Make sure you understand how to avoid triggers and how and when to use your medicines.  This information is not intended to replace advice given to you by your health care provider. Make sure you discuss any questions you have with your health care provider.  Document  Released: 10/08/2007 Document Revised: 05/26/2016 Document Reviewed: 05/26/2016  Elsevier Interactive Patient Education © 2019 [employer]

## 2018-10-19 NOTE — Telephone Encounter (Signed)
Pt referred for COVID-19 testing by Dr. Glendale Chard.  Pt called and left message to return call to be scheduled for testing.

## 2018-10-19 NOTE — Telephone Encounter (Signed)
Hello,  Dr. Baird Cancer would like to have the patient contacted for an appointment to be tested for the coronavirus.

## 2018-10-19 NOTE — Addendum Note (Signed)
Addended by: Matilde Sprang on: 10/19/2018 04:35 PM   Modules accepted: Orders

## 2018-10-20 ENCOUNTER — Encounter: Payer: Self-pay | Admitting: Internal Medicine

## 2018-10-20 ENCOUNTER — Other Ambulatory Visit: Payer: Self-pay

## 2018-10-20 DIAGNOSIS — Z20822 Contact with and (suspected) exposure to covid-19: Secondary | ICD-10-CM

## 2018-10-21 ENCOUNTER — Encounter: Payer: Self-pay | Admitting: Internal Medicine

## 2018-10-21 ENCOUNTER — Encounter (INDEPENDENT_AMBULATORY_CARE_PROVIDER_SITE_OTHER): Payer: Self-pay

## 2018-10-22 ENCOUNTER — Encounter (INDEPENDENT_AMBULATORY_CARE_PROVIDER_SITE_OTHER): Payer: Self-pay

## 2018-10-23 ENCOUNTER — Emergency Department (HOSPITAL_COMMUNITY)
Admission: EM | Admit: 2018-10-23 | Discharge: 2018-10-24 | Disposition: A | Discharge status: Home / Self Care | Payer: BLUE CROSS/BLUE SHIELD | Attending: Emergency Medicine | Admitting: Emergency Medicine

## 2018-10-23 ENCOUNTER — Other Ambulatory Visit: Payer: Self-pay

## 2018-10-23 ENCOUNTER — Encounter (INDEPENDENT_AMBULATORY_CARE_PROVIDER_SITE_OTHER): Payer: Self-pay

## 2018-10-23 ENCOUNTER — Encounter: Payer: Self-pay | Admitting: Internal Medicine

## 2018-10-23 ENCOUNTER — Emergency Department (HOSPITAL_COMMUNITY): Payer: BLUE CROSS/BLUE SHIELD

## 2018-10-23 DIAGNOSIS — Z79899 Other long term (current) drug therapy: Secondary | ICD-10-CM | POA: Insufficient documentation

## 2018-10-23 DIAGNOSIS — Y929 Unspecified place or not applicable: Secondary | ICD-10-CM | POA: Diagnosis not present

## 2018-10-23 DIAGNOSIS — Y9389 Activity, other specified: Secondary | ICD-10-CM | POA: Insufficient documentation

## 2018-10-23 DIAGNOSIS — W109XXA Fall (on) (from) unspecified stairs and steps, initial encounter: Secondary | ICD-10-CM | POA: Insufficient documentation

## 2018-10-23 DIAGNOSIS — S6992XA Unspecified injury of left wrist, hand and finger(s), initial encounter: Secondary | ICD-10-CM | POA: Diagnosis present

## 2018-10-23 DIAGNOSIS — S62617A Displaced fracture of proximal phalanx of left little finger, initial encounter for closed fracture: Secondary | ICD-10-CM

## 2018-10-23 DIAGNOSIS — J45909 Unspecified asthma, uncomplicated: Secondary | ICD-10-CM | POA: Diagnosis not present

## 2018-10-23 DIAGNOSIS — Y999 Unspecified external cause status: Secondary | ICD-10-CM | POA: Diagnosis not present

## 2018-10-23 DIAGNOSIS — Z87891 Personal history of nicotine dependence: Secondary | ICD-10-CM | POA: Insufficient documentation

## 2018-10-23 LAB — NOVEL CORONAVIRUS, NAA: SARS-CoV-2, NAA: NOT DETECTED

## 2018-10-23 NOTE — ED Triage Notes (Signed)
Pt c/o L hand pain after fall while walking up deck steps this evening @ 1930.

## 2018-10-24 MED ORDER — OXYCODONE-ACETAMINOPHEN 5-325 MG PO TABS: 1.0000 | ORAL_TABLET | Freq: Four times a day (QID) | ORAL | 0 refills | Status: DC | PRN

## 2018-10-24 MED ORDER — OXYCODONE-ACETAMINOPHEN 5-325 MG PO TABS
1.0000 | ORAL_TABLET | Freq: Once | ORAL | Status: AC
  Administered 2018-10-24: 1 via ORAL
  Filled 2018-10-24: qty 1

## 2018-10-24 NOTE — ED Notes (Signed)
Ortho tech will do plaster splint

## 2018-10-24 NOTE — ED Notes (Signed)
Pt refused ice on wound  She reports that it only makes the hand throb

## 2018-10-24 NOTE — ED Provider Notes (Signed)
[facility] EMERGENCY DEPARTMENT Provider Note   CSN: [id number] Arrival date & time: 10/23/18  2140     History   Chief Complaint Chief Complaint  Patient presents with  . Fall    HPI [place] is a 48 y.o. female.     HPI  This is a 48 year old female who presents with left hand injury.  Patient reports that she slipped and fell going up a flight of steps.  She injured her left fifth phalanx.  She is right-handed.  She rates her pain at 10 out of 10.  She took ibuprofen earlier with minimal relief.  She denies any numbness or tingling in the hand.  The pain is mostly over her proximal left fifth digit.  She denies hitting her head or loss of consciousness.  Past Medical History:  Diagnosis Date  . Arthritis   . Asthma   . Eczema     Patient Active Problem List   Diagnosis Date Noted  . Bruising 03/25/2014  . Eczema     No past surgical history on file.   OB History    Gravida  4   Para      Term      Preterm      AB  2   Living  2     SAB  2   TAB      Ectopic      Multiple      Live Births  2            Home Medications    Prior to Admission medications   Medication Sig Start Date End Date Taking? Authorizing Provider  albuterol (PROVENTIL) (2.5 MG/3ML) 0.083% nebulizer solution U 3 ML VIA NEB TID 08/24/17   [provider]  beclomethasone (QVAR) 80 MCG/ACT inhaler Inhale 2 puffs into the lungs 2 (two) times daily. 10/19/18   Glendale Chard, MD  Biotin 5 MG CAPS Take 5 mg by mouth daily.    [provider]  cetirizine (ZYRTEC) 10 MG tablet Take 10 mg by mouth daily.    [provider]  CHANTIX STARTING MONTH PAK 0.5 MG X 11 & 1 MG X 42 tablet TK UTD 08/25/17   [provider]  halobetasol (ULTRAVATE) 0.05 % ointment Apply 1 application topically 2 (two) times daily as needed (allergic reaction, rash). 10/19/18   Glendale Chard, MD  hydrochlorothiazide (HYDRODIURIL) 25 MG  tablet Take 1 tablet (25 mg total) by mouth daily. 09/11/17   Doristine Devoid, PA-C  HYDROcodone-homatropine (HYDROMET) 5-1.5 MG/5ML syrup Take 5 mLs by mouth every 6 (six) hours as needed. Patient not taking: Reported on 10/19/2018 07/07/18   Glendale Chard, MD  hydrOXYzine (ATARAX/VISTARIL) 25 MG tablet Take 1 tablet (25 mg total) by mouth every 6 (six) hours. Patient not taking: Reported on 10/19/2018 09/26/15   Veryl Speak, MD  ibuprofen (ADVIL,MOTRIN) 800 MG tablet Take 1 tablet (800 mg total) by mouth every 8 (eight) hours as needed. 09/10/17   Shelly Bombard, MD  montelukast (SINGULAIR) 10 MG tablet TAKE 1 TABLET BY MOUTH EVERY DAY IN THE EVENING 09/03/18   Glendale Chard, MD  oxyCODONE-acetaminophen (PERCOCET/ROXICET) 5-325 MG tablet Take 1 tablet by mouth every 6 (six) hours as needed for severe pain. 10/24/18   Merla Sawka, Barbette Hair, MD  Spacer/Aero-Holding Chambers (AEROCHAMBER PLUS FLO-VU MEDIUM) MISC 1 each by Other route once. Use as directed 02/09/14   Waldemar Dickens, MD  Vitamin D, Ergocalciferol, (DRISDOL)  50000 units CAPS capsule TK 1 C PO BID FOR 8 WEEKS 08/31/17   [provider]    Family History Family History  Problem Relation Age of Onset  . Hypertension Mother   . Hypertension Father   . Arthritis Father     Social History Social History   Tobacco Use  . Smoking status: Former Smoker    Packs/day: 0.25    Years: 6.00    Pack years: 1.50    Types: Cigarettes    Quit date: 07/09/2005    Years since quitting: 13.3  . Smokeless tobacco: Never Used  Substance Use Topics  . Alcohol use: Yes    Alcohol/week: 1.0 standard drinks    Types: 1 Glasses of wine per week  . Drug use: No     Allergies   Adhesive [tape] and Pollen extract   Review of Systems Review of Systems  Musculoskeletal:       Left hand pain  Skin: Negative for wound.  Neurological: Negative for weakness and numbness.  All other systems reviewed and are negative.    Physical Exam  Updated Vital Signs BP (!) 162/107 (BP Location: Right Arm)   Pulse (!) 105   Temp 98.4 F (36.9 C) (Oral)   Resp 20   LMP 10/12/2018   SpO2 99%   Physical Exam Vitals signs and nursing note reviewed.  Constitutional:      Appearance: She is well-developed.  HENT:     Head: Normocephalic and atraumatic.  Cardiovascular:     Rate and Rhythm: Normal rate and regular rhythm.  Pulmonary:     Effort: Pulmonary effort is normal. No respiratory distress.  Musculoskeletal:     Comments: Which is range of motion of the left fifth digit, swelling noted over the proximal aspect of the left fifth phalanx into the ulnar aspect of the left hand, there is tenderness to palpation, no significant deformity noted 2+ radial pulse  Skin:    General: Skin is warm and dry.  Neurological:     Mental Status: She is alert and oriented to person, place, and time.  Psychiatric:        Mood and Affect: Mood normal.      ED Treatments / Results  Labs (all labs ordered are listed, but only abnormal results are displayed) Labs Reviewed - No data to display  EKG    Radiology Dg Hand Complete Left  Result Date: 10/23/2018 CLINICAL DATA:  Slip and fall going up stairs, pain in left fifth digit. EXAM: LEFT HAND - COMPLETE 3+ VIEW COMPARISON:  None. FINDINGS: Oblique fifth proximal phalanx fracture is mildly displaced. Fracture extends to the proximal interphalangeal joint. No additional fracture of the hand. The remaining alignment and joint spaces are preserved. IMPRESSION: Oblique mildly displaced fifth proximal phalanx fracture extending to the proximal interphalangeal joint. Electronically Signed   By: Keith Rake M.D.   On: 10/23/2018 22:39    Procedures Procedures (including critical care time)  Medications Ordered in ED Medications  oxyCODONE-acetaminophen (PERCOCET/ROXICET) 5-325 MG per tablet 1 tablet (1 tablet Oral Given 10/24/18 0131)     Initial Impression / Assessment and Plan /  ED Course  I have reviewed the triage vital signs and the nursing notes.  Pertinent labs & imaging results that were available during my care of the patient were reviewed by me and considered in my medical decision making (see chart for details).        Patient presents with an injury to  the left hand and left fifth digit.  She is nontoxic-appearing and vital signs notable for blood pressure of 162/107.  X-rays show a minimally displaced fracture of the proximal phalanx.  Patient was placed in a sugar tong splint.  She will be given follow-up with hand surgery.  She will be discharged with short course of oxycodone for pain management.  After history, exam, and medical workup I feel the patient has been appropriately medically screened and is safe for discharge home. Pertinent diagnoses were discussed with the patient. Patient was given return precautions.   Final Clinical Impressions(s) / ED Diagnoses   Final diagnoses:  Closed displaced fracture of proximal phalanx of left little finger, initial encounter    ED Discharge Orders         Ordered    oxyCODONE-acetaminophen (PERCOCET/ROXICET) 5-325 MG tablet  Every 6 hours PRN     10/24/18 0136           Merryl Hacker, MD 10/24/18 [phone]

## 2018-10-24 NOTE — Discharge Instructions (Addendum)
You were seen today and found to have a fracture of your finger.  Keep splint in place.  Take medication as prescribed.  Keep elevated.  Follow-up with hand surgery this week.  Call for first available appointment.

## 2018-10-25 ENCOUNTER — Encounter (INDEPENDENT_AMBULATORY_CARE_PROVIDER_SITE_OTHER): Payer: Self-pay

## 2018-10-26 ENCOUNTER — Encounter (INDEPENDENT_AMBULATORY_CARE_PROVIDER_SITE_OTHER): Payer: Self-pay

## 2018-10-27 ENCOUNTER — Encounter (INDEPENDENT_AMBULATORY_CARE_PROVIDER_SITE_OTHER): Payer: Self-pay

## 2018-10-27 DIAGNOSIS — S62617A Displaced fracture of proximal phalanx of left little finger, initial encounter for closed fracture: Secondary | ICD-10-CM | POA: Insufficient documentation

## 2018-10-29 ENCOUNTER — Encounter (INDEPENDENT_AMBULATORY_CARE_PROVIDER_SITE_OTHER): Payer: Self-pay

## 2018-10-31 ENCOUNTER — Encounter: Payer: Self-pay | Admitting: Internal Medicine

## 2018-10-31 ENCOUNTER — Encounter (INDEPENDENT_AMBULATORY_CARE_PROVIDER_SITE_OTHER): Payer: Self-pay

## 2018-10-31 NOTE — Progress Notes (Signed)
Virtual Visit via Video   This visit type was conducted due to national recommendations for restrictions regarding the COVID-19 Pandemic (e.g. social distancing) in an effort to limit this patient's exposure and mitigate transmission in our community.  Due to her co-morbid illnesses, this patient is at least at moderate risk for complications without adequate follow up.  This format is felt to be most appropriate for this patient at this time.  All issues noted in this document were discussed and addressed.  A limited physical exam was performed with this format.    This visit type was conducted due to national recommendations for restrictions regarding the COVID-19 Pandemic (e.g. social distancing) in an effort to limit this patient's exposure and mitigate transmission in our community.  Patients identity confirmed using two different identifiers.  This format is felt to be most appropriate for this patient at this time.  All issues noted in this document were discussed and addressed.  No physical exam was performed (except for noted visual exam findings with Video Visits).    Date:  10/31/2018   ID:  Lisa Nash, DOB 02/24/1971, MRN [id number]  Patient Location:  Home  Provider location:   Office    Chief Complaint:  Cough  History of Present Illness:    [name] is a 48 y.o. female who presents via video conferencing for a telehealth visit today.    The patient does have symptoms concerning for COVID-19 infection (fever, chills, cough, or new shortness of breath).   She presents today for virtual visit. She prefers this method of contact due to COVID-19 pandemic.  She is currently working in skilled nursing facility and has been exposed to [facility] positive patients. She has had persistent cough. Wants to be tested. She was tested at work, initial test was negative. This was at least a week ago. 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   Asthma  She complains of cough. Her past medical history is significant for asthma.     Past Medical History:  Diagnosis Date  . Arthritis   . Asthma   . Eczema    No past surgical history on file.   Current Meds  Medication Sig  . albuterol (PROVENTIL) (2.5 MG/3ML) 0.083% nebulizer solution U 3 ML VIA NEB TID  . Biotin 5 MG CAPS Take 5 mg by mouth daily.  . cetirizine (ZYRTEC) 10 MG tablet Take 10 mg by mouth daily.  . CHANTIX STARTING MONTH PAK 0.5 MG X 11 & 1 MG X 42 tablet TK UTD  . hydrochlorothiazide (HYDRODIURIL) 25 MG tablet Take 1 tablet (25 mg total) by mouth daily.  Marland Kitchen ibuprofen (ADVIL,MOTRIN) 800 MG tablet Take 1 tablet (800 mg total) by mouth every 8 (eight) hours as needed.  . montelukast (SINGULAIR) 10 MG tablet TAKE 1 TABLET BY MOUTH EVERY DAY IN THE EVENING  . Spacer/Aero-Holding Chambers (AEROCHAMBER PLUS FLO-VU MEDIUM) MISC 1 each by Other route once. Use as directed     Allergies:   Adhesive [tape] and Pollen extract   Social History   Tobacco Use  . Smoking status: Former Smoker    Packs/day: 0.25    Years: 6.00    Pack years: 1.50    Types: Cigarettes    Quit date: 07/09/2005    Years since quitting: 13.3  . Smokeless tobacco: Never Used  Substance Use Topics  . Alcohol use: Yes    Alcohol/week: 1.0 standard drinks    Types: 1  Glasses of wine per week  . Drug use: No     Family Hx: The patient's family history includes Arthritis in her father; Hypertension in her father and mother.  ROS:   Please see the history of present illness.    Review of Systems  Constitutional: Negative.   Respiratory: Positive for cough.   Cardiovascular: Negative.   Gastrointestinal: Negative.   Neurological: Negative.   Psychiatric/Behavioral: Negative.     All other systems reviewed and are negative.   Labs/Other Tests and Data Reviewed:    Recent Labs: 07/07/2018: ALT 21; BUN 6; Creatinine, Ser 0.86; Hemoglobin 12.7; Platelets 290; Potassium 4.2; Sodium 140    Recent Lipid Panel No results found for: CHOL, TRIG, HDL, CHOLHDL, LDLCALC, LDLDIRECT  Wt Readings from Last 3 Encounters:  10/19/18 184 lb 6.4 oz (83.6 kg)  07/07/18 180 lb (81.6 kg)  09/24/17 173 lb 3.2 oz (78.6 kg)     Exam:    Vital Signs:  BP (!) 143/88 (BP Location: Left Arm, Patient Position: Sitting, Cuff Size: Normal) Comment: unable  Pulse 96   Temp 98.4 F (36.9 C) (Oral) Comment: pt provided  Ht 5\' 1"  (1.549 m)   Wt 184 lb 6.4 oz (83.6 kg) Comment: pt provided  LMP  (LMP Unknown)   BMI 34.84 kg/m     Physical Exam  Constitutional: She is oriented to person, place, and time and well-developed, well-nourished, and in no distress.  HENT:  Head: Normocephalic and atraumatic.  Neck: Normal range of motion.  Pulmonary/Chest: Effort normal.  Coughs frequently during video call.   Neurological: She is alert and oriented to person, place, and time.  Psychiatric: Affect normal.  Nursing note and vitals reviewed.   ASSESSMENT & PLAN:     1. Cough  I will refer him for COVID testing. Pt advised her sx could also be due to asthma exacerbation.   2. Mild intermittent asthma with exacerbation  She was encouraged to use her nebulizer as needed. Unfortunately, she has not been using her Qvar regularly. I will send a refill to the pharmacy.   3. Elevated blood pressure reading  She is encouraged to avoid adding salt to her foods. Importance of regular exercise was also stressed to the patient. She will rto in 3 months for re-evaluation.   4. Close Exposure to Covid-19 Virus  She agrees to COVID testing. She will be referred to [facility] for scheduling.   - Temperature monitoring; Future  5. Eczema, unspecified type  She was given refill of ultravate ointment as requested. If no improvement in her sx, she will f/u with derm for further evaluation.   COVID-19 Education: The signs and symptoms of COVID-19 were discussed with the patient and how to seek care for testing  (follow up with PCP or arrange E-visit).  The importance of social distancing was discussed today.  Patient Risk:   After full review of this patients clinical status, I feel that they are at least moderate risk at this time.  Time:   Today, I have spent 14 minutes/ 10 seconds with the patient with telehealth technology discussing above diagnoses.     Medication Adjustments/Labs and Tests Ordered: Current medicines are reviewed at length with the patient today.  Concerns regarding medicines are outlined above.   Tests Ordered: No orders of the defined types were placed in this encounter.   Medication Changes: Meds ordered this encounter  Medications  . beclomethasone (QVAR) 80 MCG/ACT inhaler    Sig: Inhale  2 puffs into the lungs 2 (two) times daily.    Dispense:  1 Inhaler    Refill:  5  . halobetasol (ULTRAVATE) 0.05 % ointment    Sig: Apply 1 application topically 2 (two) times daily as needed (allergic reaction, rash).    Dispense:  45 g    Refill:  0    Disposition:  Follow up in 3 month(s)  Signed, [name], MD

## 2018-11-02 HISTORY — PX: HAND SURGERY: SHX662

## 2018-11-04 LAB — HM MAMMOGRAPHY: HM Mammogram: NORMAL (ref 0–4)

## 2018-11-19 ENCOUNTER — Encounter: Payer: Self-pay | Admitting: Internal Medicine

## 2018-11-28 ENCOUNTER — Other Ambulatory Visit: Payer: Self-pay | Admitting: Obstetrics

## 2018-11-28 DIAGNOSIS — N946 Dysmenorrhea, unspecified: Secondary | ICD-10-CM

## 2019-01-27 ENCOUNTER — Encounter: Payer: Self-pay | Admitting: Internal Medicine

## 2019-02-10 ENCOUNTER — Encounter: Payer: Self-pay | Admitting: Nurse Practitioner

## 2019-02-10 ENCOUNTER — Other Ambulatory Visit: Payer: Self-pay

## 2019-02-10 ENCOUNTER — Ambulatory Visit: Payer: Medicaid Other | Admitting: Nurse Practitioner

## 2019-02-10 VITALS — Temp 97.8°F | Ht 62.4 in | Wt 181.2 lb

## 2019-02-10 DIAGNOSIS — R42 Dizziness and giddiness: Secondary | ICD-10-CM | POA: Diagnosis not present

## 2019-02-10 DIAGNOSIS — I10 Essential (primary) hypertension: Secondary | ICD-10-CM | POA: Diagnosis not present

## 2019-02-10 DIAGNOSIS — E559 Vitamin D deficiency, unspecified: Secondary | ICD-10-CM

## 2019-02-10 DIAGNOSIS — N393 Stress incontinence (female) (male): Secondary | ICD-10-CM

## 2019-02-10 DIAGNOSIS — Z0000 Encounter for general adult medical examination without abnormal findings: Secondary | ICD-10-CM

## 2019-02-10 DIAGNOSIS — E6609 Other obesity due to excess calories: Secondary | ICD-10-CM

## 2019-02-10 DIAGNOSIS — Z6834 Body mass index (BMI) 34.0-34.9, adult: Secondary | ICD-10-CM

## 2019-02-10 LAB — POCT URINALYSIS DIPSTICK
Bilirubin, UA: NEGATIVE
Blood, UA: NEGATIVE
Glucose, UA: NEGATIVE
Leukocytes, UA: NEGATIVE
Nitrite, UA: NEGATIVE
Protein, UA: POSITIVE — AB
Spec Grav, UA: 1.025 (ref 1.010–1.025)
Urobilinogen, UA: 1 E.U./dL
pH, UA: 6 (ref 5.0–8.0)

## 2019-02-10 LAB — POCT UA - MICROALBUMIN
Albumin/Creatinine Ratio, Urine, POC: 30
Creatinine, POC: 300 mg/dL
Microalbumin Ur, POC: 30 mg/L

## 2019-02-10 NOTE — Patient Instructions (Signed)
Health Maintenance  Topic Date Due  . TETANUS/TDAP  01/06/1990  . PAP SMEAR-Modifier  09/10/2020  . INFLUENZA VACCINE  Completed  . HIV Screening  Completed   Health Maintenance, Female Adopting a healthy lifestyle and getting preventive care are important in promoting health and wellness. Ask your health care provider about:  The right schedule for you to have regular tests and exams.  Things you can do on your own to prevent diseases and keep yourself healthy. What should I know about diet, weight, and exercise? Eat a healthy diet   Eat a diet that includes plenty of vegetables, fruits, low-fat dairy products, and lean protein.  Do not eat a lot of foods that are high in solid fats, added sugars, or sodium. Maintain a healthy weight Body mass index (BMI) is used to identify weight problems. It estimates body fat based on height and weight. Your health care provider can help determine your BMI and help you achieve or maintain a healthy weight. Get regular exercise Get regular exercise. This is one of the most important things you can do for your health. Most adults should:  Exercise for at least 150 minutes each week. The exercise should increase your heart rate and make you sweat (moderate-intensity exercise).  Do strengthening exercises at least twice a week. This is in addition to the moderate-intensity exercise.  Spend less time sitting. Even light physical activity can be beneficial. Watch cholesterol and blood lipids Have your blood tested for lipids and cholesterol at 48 years of age, then have this test every 5 years. Have your cholesterol levels checked more often if:  Your lipid or cholesterol levels are high.  You are older than 48 years of age.  You are at high risk for heart disease. What should I know about cancer screening? Depending on your health history and family history, you may need to have cancer screening at various ages. This may include screening for:   Breast cancer.  Cervical cancer.  Colorectal cancer.  Skin cancer.  Lung cancer. What should I know about heart disease, diabetes, and high blood pressure? Blood pressure and heart disease  High blood pressure causes heart disease and increases the risk of stroke. This is more likely to develop in people who have high blood pressure readings, are of African descent, or are overweight.  Have your blood pressure checked: ? Every 3-5 years if you are 87-31 years of age. ? Every year if you are 25 years old or older. Diabetes Have regular diabetes screenings. This checks your fasting blood sugar level. Have the screening done:  Once every three years after age 34 if you are at a normal weight and have a low risk for diabetes.  More often and at a younger age if you are overweight or have a high risk for diabetes. What should I know about preventing infection? Hepatitis B If you have a higher risk for hepatitis B, you should be screened for this virus. Talk with your health care provider to find out if you are at risk for hepatitis B infection. Hepatitis C Testing is recommended for:  Everyone born from [birth year] through 1965.  Anyone with known risk factors for hepatitis C. Sexually transmitted infections (STIs)  Get screened for STIs, including gonorrhea and chlamydia, if: ? You are sexually active and are younger than 48 years of age. ? You are older than 48 years of age and your health care provider tells you that you are at risk for  this type of infection. ? Your sexual activity has changed since you were last screened, and you are at increased risk for chlamydia or gonorrhea. Ask your health care provider if you are at risk.  Ask your health care provider about whether you are at high risk for HIV. Your health care provider may recommend a prescription medicine to help prevent HIV infection. If you choose to take medicine to prevent HIV, you should first get tested for HIV. You  should then be tested every 3 months for as long as you are taking the medicine. Pregnancy  If you are about to stop having your period (premenopausal) and you may become pregnant, seek counseling before you get pregnant.  Take 400 to 800 micrograms (mcg) of folic acid every day if you become pregnant.  Ask for birth control (contraception) if you want to prevent pregnancy. Osteoporosis and menopause Osteoporosis is a disease in which the bones lose minerals and strength with aging. This can result in bone fractures. If you are 4 years old or older, or if you are at risk for osteoporosis and fractures, ask your health care provider if you should:  Be screened for bone loss.  Take a calcium or vitamin D supplement to lower your risk of fractures.  Be given hormone replacement therapy (HRT) to treat symptoms of menopause. Follow these instructions at home: Lifestyle  Do not use any products that contain nicotine or tobacco, such as cigarettes, e-cigarettes, and chewing tobacco. If you need help quitting, ask your health care provider.  Do not use street drugs.  Do not share needles.  Ask your health care provider for help if you need support or information about quitting drugs. Alcohol use  Do not drink alcohol if: ? Your health care provider tells you not to drink. ? You are pregnant, may be pregnant, or are planning to become pregnant.  If you drink alcohol: ? Limit how much you use to 0-1 drink a day. ? Limit intake if you are breastfeeding.  Be aware of how much alcohol is in your drink. In the [place] one drink equals one 12 oz bottle of beer (355 mL), one 5 oz glass of wine (148 mL), or one 1 oz glass of hard liquor (44 mL). General instructions  Schedule regular health, dental, and eye exams.  Stay current with your vaccines.  Tell your health care provider if: ? You often feel depressed. ? You have ever been abused or do not feel safe at home. Summary  Adopting a  healthy lifestyle and getting preventive care are important in promoting health and wellness.  Follow your health care provider's instructions about healthy diet, exercising, and getting tested or screened for diseases.  Follow your health care provider's instructions on monitoring your cholesterol and blood pressure. This information is not intended to replace advice given to you by your health care provider. Make sure you discuss any questions you have with your health care provider. Document Released: 11/04/2010 Document Revised: 04/14/2018 Document Reviewed: 04/14/2018 Elsevier Patient Education  2020 [employer].

## 2019-02-10 NOTE — Progress Notes (Signed)
Subjective:     Patient ID: Lisa Nash , female    DOB: [birth year] , 48 y.o.   MRN: [id number]   Chief Complaint  Patient presents with  . Annual Exam    HPI  Here for HM Dizziness This is a chronic (the last 3 months) problem. The current episode started more than 1 year ago. The problem occurs intermittently. Pertinent negatives include no abdominal pain, fatigue, headaches, nausea or sore throat. Exacerbated by: changing positions.    The patient states she uses none for birth control. Last LMP was Patient's last menstrual period was 02/07/2019.. Negative for Dysmenorrhea and Negative for Menorrhagia Mammogram last done 11/04/2018.  Negative for: breast discharge, breast lump(s), breast pain and breast self exam.  Pertinent negatives include abnormal bleeding (hematology), anxiety, decreased libido, depression, difficulty falling sleep, dyspareunia, history of infertility, nocturia, sexual dysfunction, sleep disturbances, urinary incontinence, urinary urgency, vaginal discharge and vaginal itching. Diet regular. The patient states her exercise level is  minimal walking around the block 2 times a week average.      The patient's tobacco use is:  Social History   Tobacco Use  Smoking Status Former Smoker  . Packs/day: 0.25  . Years: 6.00  . Pack years: 1.50  . Types: Cigarettes  . Quit date: 07/09/2005  . Years since quitting: 13.6  Smokeless Tobacco Never Used   She has been exposed to passive smoke. The patient's alcohol use is:  Social History   Substance and Sexual Activity  Alcohol Use Yes  . Alcohol/week: 1.0 standard drinks  . Types: 1 Glasses of wine per week   Additional information: next one scheduled in 2 weeks.  Past Medical History:  Diagnosis Date  . Arthritis   . Asthma   . Eczema      Family History  Problem Relation Age of Onset  . Hypertension Mother   . Hypertension Father   . Arthritis Father      Current Outpatient Medications:  .   albuterol (PROVENTIL) (2.5 MG/3ML) 0.083% nebulizer solution, U 3 ML VIA NEB TID, Disp: , Rfl: 2 .  beclomethasone (QVAR) 80 MCG/ACT inhaler, Inhale 2 puffs into the lungs 2 (two) times daily., Disp: 1 Inhaler, Rfl: 5 .  Biotin 5 MG CAPS, Take 5 mg by mouth daily., Disp: , Rfl:  .  cetirizine (ZYRTEC) 10 MG tablet, Take 10 mg by mouth daily., Disp: , Rfl:  .  CHANTIX STARTING MONTH PAK 0.5 MG X 11 & 1 MG X 42 tablet, TK UTD, Disp: , Rfl: 0 .  halobetasol (ULTRAVATE) 0.05 % ointment, Apply 1 application topically 2 (two) times daily as needed (allergic reaction, rash)., Disp: 45 g, Rfl: 0 .  hydrochlorothiazide (HYDRODIURIL) 25 MG tablet, Take 1 tablet (25 mg total) by mouth daily., Disp: 30 tablet, Rfl: 0 .  ibuprofen (ADVIL) 800 MG tablet, TAKE 1 TABLET(800 MG) BY MOUTH EVERY 8 HOURS AS NEEDED, Disp: 30 tablet, Rfl: 5 .  montelukast (SINGULAIR) 10 MG tablet, TAKE 1 TABLET BY MOUTH EVERY DAY IN THE EVENING, Disp: 90 tablet, Rfl: 3 .  Spacer/Aero-Holding Chambers (AEROCHAMBER PLUS FLO-VU MEDIUM) MISC, 1 each by Other route once. Use as directed, Disp: 1 each, Rfl: 1   Allergies  Allergen Reactions  . Adhesive [Tape]   . Pollen Extract      Review of Systems  Constitutional: Negative.  Negative for fatigue.  HENT: Negative.  Negative for sore throat.   Eyes: Negative.   Respiratory: Negative.  Cardiovascular: Negative.   Gastrointestinal: Negative.  Negative for abdominal pain and nausea.  Endocrine: Negative.   Genitourinary: Negative.   Musculoskeletal: Negative.   Skin: Negative.   Allergic/Immunologic: Negative.   Neurological: Positive for dizziness. Negative for headaches.  Hematological: Negative.   Psychiatric/Behavioral: Negative.      Today's Vitals   02/10/19 1422  Temp: 97.8 F (36.6 C)  TempSrc: Oral  Weight: 181 lb 3.2 oz (82.2 kg)  Height: 5' 2.4" (1.585 m)   Body mass index is 32.72 kg/m.   Objective:  Physical Exam Vitals signs reviewed.   Constitutional:      Appearance: Normal appearance. She is well-developed.  HENT:     Head: Normocephalic and atraumatic.     Right Ear: Hearing, tympanic membrane, ear canal and external ear normal.     Left Ear: Hearing, tympanic membrane, ear canal and external ear normal.  Eyes:     General: Lids are normal.     Extraocular Movements: Extraocular movements intact.     Conjunctiva/sclera: Conjunctivae normal.     Pupils: Pupils are equal, round, and reactive to light.     Funduscopic exam:    Right eye: No papilledema.        Left eye: No papilledema.  Neck:     Musculoskeletal: Full passive range of motion without pain, normal range of motion and neck supple.     Thyroid: No thyroid mass.     Vascular: No carotid bruit.  Cardiovascular:     Rate and Rhythm: Normal rate and regular rhythm.     Pulses: Normal pulses.     Heart sounds: Normal heart sounds. No murmur.  Pulmonary:     Effort: Pulmonary effort is normal.     Breath sounds: Normal breath sounds.  Abdominal:     General: Abdomen is flat. Bowel sounds are normal.     Palpations: Abdomen is soft.  Musculoskeletal: Normal range of motion.        General: No swelling.     Right lower leg: No edema.     Left lower leg: No edema.  Skin:    General: Skin is warm and dry.     Capillary Refill: Capillary refill takes less than 2 seconds.  Neurological:     General: No focal deficit present.     Mental Status: She is alert and oriented to person, place, and time.     Cranial Nerves: No cranial nerve deficit.     Sensory: No sensory deficit.  Psychiatric:        Mood and Affect: Mood normal.        Behavior: Behavior normal.        Thought Content: Thought content normal.        Judgment: Judgment normal.         Assessment And Plan:       1. Health maintenance examination . Behavior modifications discussed and diet history reviewed.   . Pt will continue to exercise regularly and modify diet with low GI,  plant based foods and decrease intake of processed foods.  . Recommend intake of daily multivitamin, Vitamin D, and calcium.  . Recommend mammogram and colonoscopy for preventive screenings, as well as recommend immunizations that include influenza, TDAP - Lipid panel  2. Essential hypertension, benign . B/P is controlled.  . CMP ordered to check renal function.  . The importance of regular exercise and dietary modification was stressed to the patient.  . EKG done NSR HR  98 - POCT Urinalysis Dipstick (81002) - POCT UA - Microalbumin - EKG 12-Lead - CMP14 + Anion Gap - TSH  3. Stress incontinence of urine  She has been having incontinence more frequently   I will refer for PT of pelvic - Ambulatory referral to Physical Therapy  4. Dizziness  orthostats are slightly positive with an increase in her heart rate   Encouraged her to increase her water intake to at least one gallon per day - CMP14 + Anion Gap - Hemoglobin A1c - CBC  5. Class 1 obesity due to excess calories without serious comorbidity with body mass index (BMI) of 34.0 to 34.9 in adult  Chronic  Discussed healthy diet and regular exercise options   Encouraged to exercise at least 150 minutes per week with 2 days of strength training  6. Vitamin D deficiency  Will check vitamin D level and supplement as needed.     Also encouraged to spend 15 minutes in the sun daily.  - VITAMIN D 25 Hydroxy (Vit-D Deficiency, Fractures)     Minette Brine, FNP    THE PATIENT IS ENCOURAGED TO PRACTICE SOCIAL DISTANCING DUE TO THE COVID-19 PANDEMIC.

## 2019-02-11 LAB — CBC
Hematocrit: 36.2 % (ref 34.0–46.6)
Hemoglobin: 11.9 g/dL (ref 11.1–15.9)
MCH: 30 pg (ref 26.6–33.0)
MCHC: 32.9 g/dL (ref 31.5–35.7)
MCV: 91 fL (ref 79–97)
Platelets: 354 10*3/uL (ref 150–450)
RBC: 3.97 x10E6/uL (ref 3.77–5.28)
RDW: 13.6 % (ref 11.7–15.4)
WBC: 10.7 10*3/uL (ref 3.4–10.8)

## 2019-02-11 LAB — LIPID PANEL
Chol/HDL Ratio: 3.7 ratio (ref 0.0–4.4)
Cholesterol, Total: 132 mg/dL (ref 100–199)
HDL: 36 mg/dL — ABNORMAL LOW (ref >39)
LDL Chol Calc (NIH): 76 mg/dL (ref 0–99)
Triglycerides: 106 mg/dL (ref 0–149)
VLDL Cholesterol Cal: 20 mg/dL (ref 5–40)

## 2019-02-11 LAB — CMP14 + ANION GAP
ALT: 16 IU/L (ref 0–32)
AST: 22 IU/L (ref 0–40)
Albumin/Globulin Ratio: 1.4 (ref 1.2–2.2)
Albumin: 4.2 g/dL (ref 3.8–4.8)
Alkaline Phosphatase: 71 IU/L (ref 39–117)
Anion Gap: 13 mmol/L (ref 10.0–18.0)
BUN/Creatinine Ratio: 14 (ref 9–23)
BUN: 13 mg/dL (ref 6–24)
Bilirubin Total: <0.2 mg/dL (ref 0.0–1.2)
CO2: 20 mmol/L (ref 20–29)
Calcium: 8.9 mg/dL (ref 8.7–10.2)
Chloride: 104 mmol/L (ref 96–106)
Creatinine, Ser: 0.91 mg/dL (ref 0.57–1.00)
GFR calc Af Amer: 86 mL/min/{1.73_m2} (ref >59)
GFR calc non Af Amer: 75 mL/min/{1.73_m2} (ref >59)
Globulin, Total: 3.1 g/dL (ref 1.5–4.5)
Glucose: 70 mg/dL (ref 65–99)
Potassium: 3.9 mmol/L (ref 3.5–5.2)
Sodium: 137 mmol/L (ref 134–144)
Total Protein: 7.3 g/dL (ref 6.0–8.5)

## 2019-02-11 LAB — HEMOGLOBIN A1C
Est. average glucose Bld gHb Est-mCnc: 111 mg/dL
Hgb A1c MFr Bld: 5.5 % (ref 4.8–5.6)

## 2019-02-11 LAB — VITAMIN D 25 HYDROXY (VIT D DEFICIENCY, FRACTURES): Vit D, 25-Hydroxy: 13.6 ng/mL — ABNORMAL LOW (ref 30.0–100.0)

## 2019-02-11 LAB — TSH: TSH: 0.892 u[IU]/mL (ref 0.450–4.500)

## 2019-02-14 ENCOUNTER — Other Ambulatory Visit: Payer: Self-pay | Admitting: Nurse Practitioner

## 2019-02-14 ENCOUNTER — Encounter: Payer: Self-pay | Admitting: Nurse Practitioner

## 2019-02-14 ENCOUNTER — Other Ambulatory Visit: Payer: Self-pay

## 2019-02-14 DIAGNOSIS — E559 Vitamin D deficiency, unspecified: Secondary | ICD-10-CM

## 2019-02-14 MED ORDER — TERBINAFINE HCL 250 MG PO TABS: 250.0000 mg | ORAL_TABLET | Freq: Every day | ORAL | 0 refills | Status: DC

## 2019-02-14 MED ORDER — VITAMIN D (ERGOCALCIFEROL) 1.25 MG (50000 UNIT) PO CAPS: 50000.0000 [IU] | ORAL_CAPSULE | ORAL | 1 refills | Status: DC

## 2019-02-14 NOTE — Progress Notes (Signed)
t

## 2019-03-10 ENCOUNTER — Encounter: Payer: Self-pay | Admitting: Obstetrics

## 2019-03-10 ENCOUNTER — Other Ambulatory Visit: Payer: Self-pay

## 2019-03-10 ENCOUNTER — Ambulatory Visit (INDEPENDENT_AMBULATORY_CARE_PROVIDER_SITE_OTHER): Payer: BLUE CROSS/BLUE SHIELD | Admitting: Obstetrics

## 2019-03-10 VITALS — BP 141/89 | HR 87 | Ht 61.0 in | Wt 181.7 lb

## 2019-03-10 DIAGNOSIS — Z1151 Encounter for screening for human papillomavirus (HPV): Secondary | ICD-10-CM

## 2019-03-10 DIAGNOSIS — Z124 Encounter for screening for malignant neoplasm of cervix: Secondary | ICD-10-CM

## 2019-03-10 DIAGNOSIS — Z113 Encounter for screening for infections with a predominantly sexual mode of transmission: Secondary | ICD-10-CM

## 2019-03-10 DIAGNOSIS — D251 Intramural leiomyoma of uterus: Secondary | ICD-10-CM | POA: Diagnosis not present

## 2019-03-10 DIAGNOSIS — B9689 Other specified bacterial agents as the cause of diseases classified elsewhere: Secondary | ICD-10-CM

## 2019-03-10 DIAGNOSIS — N944 Primary dysmenorrhea: Secondary | ICD-10-CM

## 2019-03-10 DIAGNOSIS — N760 Acute vaginitis: Secondary | ICD-10-CM | POA: Diagnosis not present

## 2019-03-10 DIAGNOSIS — I10 Essential (primary) hypertension: Secondary | ICD-10-CM

## 2019-03-10 DIAGNOSIS — N898 Other specified noninflammatory disorders of vagina: Secondary | ICD-10-CM | POA: Diagnosis not present

## 2019-03-10 DIAGNOSIS — N393 Stress incontinence (female) (male): Secondary | ICD-10-CM

## 2019-03-10 DIAGNOSIS — F17210 Nicotine dependence, cigarettes, uncomplicated: Secondary | ICD-10-CM

## 2019-03-10 DIAGNOSIS — Z01419 Encounter for gynecological examination (general) (routine) without abnormal findings: Secondary | ICD-10-CM

## 2019-03-10 NOTE — Progress Notes (Signed)
Subjective:        Lisa Nash is a 48 y.o. female here for a routine exam.  Current complaints: heavy and painful periods, and leaking of urine with coughing, laughing hard, etc.  Has a history of fibroids  Personal health questionnaire:  Is patient Ashkenazi Jewish, have a family history of breast and/or ovarian cancer: no Is there a family history of uterine cancer diagnosed at age < 81, gastrointestinal cancer, urinary tract cancer, family member who is a Field seismologist syndrome-associated carrier: no Is the patient overweight and hypertensive, family history of diabetes, personal history of gestational diabetes, preeclampsia or PCOS: no Is patient over 42, have PCOS,  family history of premature CHD under age 7, diabetes, smoke, have hypertension or peripheral artery disease:  no At any time, has a partner hit, kicked or otherwise hurt or frightened you?: no Over the past 2 weeks, have you felt down, depressed or hopeless?: no Over the past 2 weeks, have you felt little interest or pleasure in doing things?:no   Gynecologic History Patient's last menstrual period was 02/07/2019. Contraception: none Last Pap: 09-10-2017. Results were: normal Last mammogram: 11-04-2018. Results were: normal  Obstetric History OB History  Gravida Para Term Preterm AB Living  4       2 2   SAB TAB Ectopic Multiple Live Births  2       2    # Outcome Date GA Lbr Len/2nd Weight Sex Delivery Anes PTL Lv  4 Gravida 08/18/02        LIV  3 Gravida 11/10/90        LIV  2 SAB           1 SAB             Past Medical History:  Diagnosis Date  . Arthritis   . Asthma   . Eczema     Past Surgical History:  Procedure Laterality Date  . HAND SURGERY Left 11/02/2018     Current Outpatient Medications:  .  albuterol (PROVENTIL) (2.5 MG/3ML) 0.083% nebulizer solution, U 3 ML VIA NEB TID, Disp: , Rfl: 2 .  beclomethasone (QVAR) 80 MCG/ACT inhaler, Inhale 2 puffs into the lungs 2 (two) times daily.,  Disp: 1 Inhaler, Rfl: 5 .  cetirizine (ZYRTEC) 10 MG tablet, Take 10 mg by mouth daily., Disp: , Rfl:  .  CHANTIX STARTING MONTH PAK 0.5 MG X 11 & 1 MG X 42 tablet, TK UTD, Disp: , Rfl: 0 .  halobetasol (ULTRAVATE) 0.05 % ointment, Apply 1 application topically 2 (two) times daily as needed (allergic reaction, rash)., Disp: 45 g, Rfl: 0 .  ibuprofen (ADVIL) 800 MG tablet, TAKE 1 TABLET(800 MG) BY MOUTH EVERY 8 HOURS AS NEEDED, Disp: 30 tablet, Rfl: 5 .  montelukast (SINGULAIR) 10 MG tablet, TAKE 1 TABLET BY MOUTH EVERY DAY IN THE EVENING, Disp: 90 tablet, Rfl: 3 .  Spacer/Aero-Holding Chambers (AEROCHAMBER PLUS FLO-VU MEDIUM) MISC, 1 each by Other route once. Use as directed, Disp: 1 each, Rfl: 1 .  terbinafine (LAMISIL) 250 MG tablet, Take 1 tablet (250 mg total) by mouth daily., Disp: 90 tablet, Rfl: 0 .  Vitamin D, Ergocalciferol, (DRISDOL) 1.25 MG (50000 UT) CAPS capsule, Take 1 capsule (50,000 Units total) by mouth 2 (two) times a week., Disp: 24 capsule, Rfl: 1 .  hydrochlorothiazide (HYDRODIURIL) 25 MG tablet, Take 1 tablet (25 mg total) by mouth daily. (Patient not taking: Reported on 03/10/2019), Disp: 30 tablet, Rfl: 0 Allergies  Allergen Reactions  . Adhesive [Tape]   . Pollen Extract     Social History   Tobacco Use  . Smoking status: Current Some Day Smoker    Packs/day: 0.25    Years: 6.00    Pack years: 1.50    Types: Cigarettes    Last attempt to quit: 07/09/2005    Years since quitting: 13.6  . Smokeless tobacco: Never Used  Substance Use Topics  . Alcohol use: Yes    Alcohol/week: 3.0 - 4.0 standard drinks    Types: 1 Glasses of wine, 2 - 3 Cans of beer per week    Comment: drinks beer 2-3 times per week, wine occassionally    Family History  Problem Relation Age of Onset  . Hypertension Mother   . Hypertension Father   . Arthritis Father       Review of Systems  Constitutional: negative for fatigue and weight loss Respiratory: negative for cough and  wheezing Cardiovascular: negative for chest pain, fatigue and palpitations Gastrointestinal: negative for abdominal pain and change in bowel habits Musculoskeletal:negative for myalgias Neurological: negative for gait problems and tremors Behavioral/Psych: negative for abusive relationship, depression Endocrine: negative for temperature intolerance    Genitourinary:positive for abnormal menstrual periods, genital lesions, hot flashes, sexual problems and vaginal discharge Integument/breast: negative for breast lump, breast tenderness, nipple discharge and skin lesion(s)    Objective:       BP (!) 141/89   Pulse 87   Ht 5\' 1"  (1.549 m)   Wt 181 lb 11.2 oz (82.4 kg)   LMP 02/07/2019   BMI 34.33 kg/m  General:   alert  Skin:   no rash or abnormalities  Lungs:   clear to auscultation bilaterally  Heart:   regular rate and rhythm, S1, S2 normal, no murmur, click, rub or gallop  Breasts:   normal without suspicious masses, skin or nipple changes or axillary nodes  Abdomen:  normal findings: no organomegaly, soft, non-tender and no hernia  Pelvis:  External genitalia: normal general appearance Urinary system: urethral meatus normal and bladder without fullness, nontender Vaginal: normal without tenderness, induration or masses Cervix: normal appearance Adnexa: normal bimanual exam Uterus: anteverted and non-tender, normal size   Lab Review Urine pregnancy test Labs reviewed yes Radiologic studies reviewed yes  US PELVIC COMPLETE WITH TRANSVAGINAL (Accession [id number]) (Order [id number]) Imaging Date: 09/17/2017 Department: [facility] Released By: Adelene Amas, NT Authorizing: Shelly Bombard, MD  Exam Information  Status Exam Begun  Exam Ended   Final [99] 09/17/2017 10:23 AM 09/17/2017 11:06 AM  PACS Intelerad Image Link  Show images for US PELVIC COMPLETE WITH TRANSVAGINAL  Study Result  CLINICAL DATA:  Fibroids.  LMP  09/03/2017.  EXAM: TRANSABDOMINAL AND TRANSVAGINAL ULTRASOUND OF PELVIS  TECHNIQUE: Both transabdominal and transvaginal ultrasound examinations of the pelvis were performed. Transabdominal technique was performed for global imaging of the pelvis including uterus, ovaries, adnexal regions, and pelvic cul-de-sac. It was necessary to proceed with endovaginal exam following the transabdominal exam to visualize the uterus, endometrium, ovaries, and adnexal regions.  COMPARISON:  08/03/2015  FINDINGS: Uterus  Measurements: The uterus is enlarged, measuring at least 14.6 x 6.8 x 8.3 centimeters. Numerous fibroids are measured:  LEFT anterior fundal subserosal in fibroid is 4.4 x 3.8 x 3.3 centimeters.  LEFT anterior myometrial fibroid is 4.6 x 4.0 x 4.4 centimeters.  Posterior myometrial fibroid is 3.4 x 2.6 x 2.4 centimeters.  Posterior fundal myometrial fibroid is 2.9 x 2.6  x 2.8 centimeters.  Endometrium  Thickness: 13 millimeters.  Heterogeneous in appearance.  Right ovary  Measurements: 3.7 x 2.4 x 1.5 centimeter. Normal appearance/no adnexal mass.  Left ovary  Measurements: 4.0 x 1.4 x 1.9 centimeters. Normal appearance/no adnexal mass.  Other findings  No abnormal free fluid.  IMPRESSION: 1. Enlarged uterus containing numerous fibroids. Largest is in the fundal region measuring 4.6 centimeters. 2. Mildly heterogeneous endometrium is normal in thickness. 3. Normal appearance of both ovaries.   Electronically Signed   By: Nolon Nations M.D.   On: 09/17/2017 12:36   Result History  US PELVIC COMPLETE WITH TRANSVAGINAL (Order [id number]) on 09/17/2017 - Order Result History Report  Encounter-Level Documents - 09/17/2017:  Electronic signature on 09/17/2017 9:58 AM: Rhea Bleacher - E-signed Scan on 09/17/2017 11:10 AM by Default, Provider, MD     Order-Level Documents:  There are no order-level documents. Hospital account-Level  Documents:  There are no hospital account-level documents. Vitals  Height Weight BMI (Calculated)  5\' 1"  (1.549 m) 173 lb 3.2 oz (78.6 kg) 32.74  Imaging  Imaging Information  Resulted by:  Signed Date/Time  Phone Pager  Nolon Nations 09/17/2017 12:36 PM [id number] [phone]  Original Order  Ordered On Ordered By   09/10/2017 9:54 AM Shelly Bombard, MD           US PELVIC COMPLETE WITH TRANSVAGINAL [id number] Resulted: 09/17/17 1236, Result status: Final result Resulted by: Nolon Nations, MD Performed: 09/17/17 1023 - 09/17/17 1106  Accession number: [id number] Resulting lab: [facility]  Narrative:  CLINICAL DATA: Fibroids. LMP 09/03/2017.   EXAM:  TRANSABDOMINAL AND TRANSVAGINAL ULTRASOUND OF PELVIS   TECHNIQUE:  Both transabdominal and transvaginal ultrasound examinations of the  pelvis were performed. Transabdominal technique was performed for  global imaging of the pelvis including uterus, ovaries, adnexal  regions, and pelvic cul-de-sac. It was necessary to proceed with  endovaginal exam following the transabdominal exam to visualize the  uterus, endometrium, ovaries, and adnexal regions.   COMPARISON: 08/03/2015   FINDINGS:  Uterus   Measurements: The uterus is enlarged, measuring at least 14.6 x 6.8  x 8.3 centimeters. Numerous fibroids are measured:   LEFT anterior fundal subserosal in fibroid is 4.4 x 3.8 x 3.3  centimeters.   LEFT anterior myometrial fibroid is 4.6 x 4.0 x 4.4 centimeters.   Posterior myometrial fibroid is 3.4 x 2.6 x 2.4 centimeters.   Posterior fundal myometrial fibroid is 2.9 x 2.6 x 2.8 centimeters.   Endometrium   Thickness: 13 millimeters. Heterogeneous in appearance.   Right ovary   Measurements: 3.7 x 2.4 x 1.5 centimeter. Normal appearance/no  adnexal mass.   Left ovary   Measurements: 4.0 x 1.4 x 1.9 centimeters. Normal appearance/no  adnexal mass.   Other findings   No abnormal  free fluid.   IMPRESSION:  1. Enlarged uterus containing numerous fibroids. Largest is in the  fundal region measuring 4.6 centimeters.  2. Mildly heterogeneous endometrium is normal in thickness.  3. Normal appearance of both ovaries.    Electronically Signed  By: Nolon Nations M.D.  On: 09/17/2017 12:36   External Result Report  External Result Report    50% of 25 min visit spent on counseling and coordination of care.   Assessment:     1. Encounter for routine gynecological examination with Papanicolaou smear of cervix Rx: - Cytology - PAP( [facility])  2. Fibroids, intramural Rx: - Ambulatory referral to Urogynecology  3. Primary dysmenorrhea - Ibuprofen prn  4. SUI (stress urinary incontinence, female) Rx: - Ambulatory referral to Urogynecology  5. Vaginal discharge Rx: - Cervicovaginal ancillary only( [facility])  6. Screen for STD (sexually transmitted disease) Rx: - Hepatitis B surface antigen - Hepatitis C antibody - HIV antibody - RPR  7. Tobacco dependence due to cigarettes - has tried Chantix without success - encouraged to continue tobacco cessation with medical therapy and behavioral modification  8. HTN (hypertension), benign - clinically stable    Plan:    Education reviewed: calcium supplements, depression evaluation, low fat, low cholesterol diet, safe sex/STD prevention, self breast exams, smoking cessation and weight bearing exercise. Follow up in: 6 months.    Orders Placed This Encounter  Procedures  . Hepatitis B surface antigen  . Hepatitis C antibody  . HIV antibody  . RPR  . Ambulatory referral to Urogynecology    Referral Priority:   Routine    Referral Type:   Consultation    Referral Reason:   Specialty Services Required    Requested Specialty:   Urology    Number of Visits Requested:   1    Shelly Bombard, MD 03/10/2019 10:41 AM

## 2019-03-10 NOTE — Progress Notes (Signed)
Pt presents for AEX. Last pap 09/2017 Normal, Last MM 11/04/2018. Pt request referral for her fibroids.

## 2019-03-11 LAB — CERVICOVAGINAL ANCILLARY ONLY
Bacterial Vaginitis (gardnerella): POSITIVE — AB
Candida Glabrata: NEGATIVE
Candida Vaginitis: NEGATIVE
Chlamydia: NEGATIVE
Comment: NEGATIVE
Comment: NEGATIVE
Comment: NEGATIVE
Comment: NEGATIVE
Comment: NEGATIVE
Comment: NORMAL
Neisseria Gonorrhea: NEGATIVE
Trichomonas: NEGATIVE

## 2019-03-11 LAB — HEPATITIS C ANTIBODY: Hep C Virus Ab: 0.1 s/co ratio (ref 0.0–0.9)

## 2019-03-11 LAB — RPR: RPR Ser Ql: NONREACTIVE

## 2019-03-11 LAB — HEPATITIS B SURFACE ANTIGEN: Hepatitis B Surface Ag: NEGATIVE

## 2019-03-11 LAB — HIV ANTIBODY (ROUTINE TESTING W REFLEX): HIV Screen 4th Generation wRfx: NONREACTIVE

## 2019-03-14 ENCOUNTER — Other Ambulatory Visit: Payer: Self-pay | Admitting: Obstetrics

## 2019-03-14 DIAGNOSIS — N760 Acute vaginitis: Secondary | ICD-10-CM

## 2019-03-14 DIAGNOSIS — B9689 Other specified bacterial agents as the cause of diseases classified elsewhere: Secondary | ICD-10-CM

## 2019-03-14 LAB — CYTOLOGY - PAP
Adequacy: ABSENT
Comment: NEGATIVE
Diagnosis: NEGATIVE
High risk HPV: NEGATIVE

## 2019-03-14 MED ORDER — METRONIDAZOLE 500 MG PO TABS: 500.0000 mg | ORAL_TABLET | Freq: Two times a day (BID) | ORAL | 2 refills | Status: DC

## 2019-05-14 ENCOUNTER — Encounter (HOSPITAL_COMMUNITY): Payer: Self-pay

## 2019-05-14 ENCOUNTER — Ambulatory Visit (INDEPENDENT_AMBULATORY_CARE_PROVIDER_SITE_OTHER): Payer: Medicaid Other

## 2019-05-14 ENCOUNTER — Ambulatory Visit (HOSPITAL_COMMUNITY)
Admission: EM | Admit: 2019-05-14 | Discharge: 2019-05-14 | Disposition: A | Discharge status: Home / Self Care | Payer: Medicaid Other | Attending: Emergency Medicine | Admitting: Emergency Medicine

## 2019-05-14 ENCOUNTER — Other Ambulatory Visit: Payer: Self-pay

## 2019-05-14 DIAGNOSIS — S40022A Contusion of left upper arm, initial encounter: Secondary | ICD-10-CM

## 2019-05-14 DIAGNOSIS — S5002XA Contusion of left elbow, initial encounter: Secondary | ICD-10-CM

## 2019-05-14 MED ORDER — NAPROXEN 500 MG PO TABS: 500.0000 mg | ORAL_TABLET | Freq: Two times a day (BID) | ORAL | 0 refills | Status: AC

## 2019-05-14 NOTE — ED Triage Notes (Signed)
Pt presents with left upper arm and lower arm pain & bruising following a fall injury last night.

## 2019-05-14 NOTE — Discharge Instructions (Addendum)
Your x-ray was negative for fracture or dislocation.  Bruises however can be very painful.  Stop the ibuprofen because it is not working.  Try the Naprosyn with the 1000 mg of Tylenol twice a day as needed.  You may take an additional 1000 mg of Tylenol 2 more times a day for total of 4000 mg a day.  Ice for the first 72 hours, then apply heat as needed.  Ace wrap for comfort.

## 2019-05-14 NOTE — ED Provider Notes (Signed)
HPI  SUBJECTIVE:  Lisa Nash is a 49 y.o. female who presents with left upper arm and elbow pain having a slip and fall on the snow last night.  She states that her left arm went in between 2 steps.  She states that she landed on her elbow.  She denies head trauma.  She reports medial left elbow pain described as stabbing, sore, constant.  She reports limitation of motion of the elbow secondary to the pain.  She reports bruising, swelling medial epicondyle and lateral humerus.  No numbness or tingling of her hand, ring finger/little finger, grip weakness, erythema.  She denies injury to the shoulder or wrist.  She tried ibuprofen 800 mg with some improvement in her symptoms, and ice.  Symptoms are worse with palpation, movement.  She has a past medical history of easy bruising, asthma.  No history of osteoporosis.  LMP: Yesterday.  Denies the possibility being pregnant.  [id number], Bailey Mech, MD   Past Medical History:  Diagnosis Date  . Arthritis   . Asthma   . Eczema     Past Surgical History:  Procedure Laterality Date  . HAND SURGERY Left 11/02/2018    Family History  Problem Relation Age of Onset  . Hypertension Mother   . Hypertension Father   . Arthritis Father     Social History   Tobacco Use  . Smoking status: Current Some Day Smoker    Packs/day: 0.25    Years: 6.00    Pack years: 1.50    Types: Cigarettes    Last attempt to quit: 07/09/2005    Years since quitting: 13.8  . Smokeless tobacco: Never Used  Substance Use Topics  . Alcohol use: Yes    Alcohol/week: 3.0 - 4.0 standard drinks    Types: 1 Glasses of wine, 2 - 3 Cans of beer per week    Comment: drinks beer 2-3 times per week, wine occassionally  . Drug use: No    No current facility-administered medications for this encounter.  Current Outpatient Medications:  .  albuterol (PROVENTIL) (2.5 MG/3ML) 0.083% nebulizer solution, U 3 ML VIA NEB TID, Disp: , Rfl: 2 .  beclomethasone (QVAR) 80  MCG/ACT inhaler, Inhale 2 puffs into the lungs 2 (two) times daily., Disp: 1 Inhaler, Rfl: 5 .  cetirizine (ZYRTEC) 10 MG tablet, Take 10 mg by mouth daily., Disp: , Rfl:  .  CHANTIX STARTING MONTH PAK 0.5 MG X 11 & 1 MG X 42 tablet, TK UTD, Disp: , Rfl: 0 .  halobetasol (ULTRAVATE) 0.05 % ointment, Apply 1 application topically 2 (two) times daily as needed (allergic reaction, rash)., Disp: 45 g, Rfl: 0 .  hydrochlorothiazide (HYDRODIURIL) 25 MG tablet, Take 1 tablet (25 mg total) by mouth daily. (Patient not taking: Reported on 03/10/2019), Disp: 30 tablet, Rfl: 0 .  metroNIDAZOLE (FLAGYL) 500 MG tablet, Take 1 tablet (500 mg total) by mouth 2 (two) times daily., Disp: 14 tablet, Rfl: 2 .  montelukast (SINGULAIR) 10 MG tablet, TAKE 1 TABLET BY MOUTH EVERY DAY IN THE EVENING, Disp: 90 tablet, Rfl: 3 .  naproxen (NAPROSYN) 500 MG tablet, Take 1 tablet (500 mg total) by mouth 2 (two) times daily for 5 days., Disp: 10 tablet, Rfl: 0 .  Spacer/Aero-Holding Chambers (AEROCHAMBER PLUS FLO-VU MEDIUM) MISC, 1 each by Other route once. Use as directed, Disp: 1 each, Rfl: 1 .  terbinafine (LAMISIL) 250 MG tablet, Take 1 tablet (250 mg total) by mouth daily., Disp: 90 tablet,  Rfl: 0 .  Vitamin D, Ergocalciferol, (DRISDOL) 1.25 MG (50000 UT) CAPS capsule, Take 1 capsule (50,000 Units total) by mouth 2 (two) times a week., Disp: 24 capsule, Rfl: 1  Allergies  Allergen Reactions  . Adhesive [Tape]   . Pollen Extract      ROS  As noted in HPI.   Physical Exam  BP (!) 169/101 (BP Location: Right Arm)   Pulse 98   Temp 97.9 F (36.6 C) (Oral)   Resp 18   LMP 05/08/2019   SpO2 100%   Constitutional: Well developed, well nourished, no acute distress Eyes:  EOMI, conjunctiva normal bilaterally HENT: Normocephalic, atraumatic,mucus membranes moist Respiratory: Normal inspiratory effort Cardiovascular: Normal rate GI: nondistended skin: No rash, skin intact Musculoskeletal: no deformities. L Elbow  ROM  Decreased. Supracondylar region NT, Radial head NT, Olecrenon process NT , Medial epicondyle tender, Lateral epicondyle NT, humerus nontender, wrist NT, Hand NT with distal NVI CR<2secs, radial pulse intact, Sensation LT and Motor grossly intact distally in distribution of radial, median, and ulnar nerve function.  Grip strength 5/5.  Positive bruise, swelling medial epicondyle and bruising on lateral humerus.  See pictures       Neurologic: Alert & oriented x 3, no focal neuro deficits Psychiatric: Speech and behavior appropriate   ED Course   Medications - No data to display  Orders Placed This Encounter  Procedures  . DG Elbow Complete Left    Standing Status:   Standing    Number of Occurrences:   1    Order Specific Question:   Reason for Exam (SYMPTOM  OR DIAGNOSIS REQUIRED)    Answer:   fall medial epicondyle tenderness rule out fracture  . Apply ace wrap    Standing Status:   Standing    Number of Occurrences:   1    No results found for this or any previous visit (from the past 24 hour(s)). DG Elbow Complete Left  Result Date: 05/14/2019 CLINICAL DATA:  Left elbow pain after a fall last night. Initial encounter. EXAM: LEFT ELBOW - COMPLETE 3+ VIEW COMPARISON:  None. FINDINGS: There is no evidence of fracture, dislocation, or joint effusion. There is no evidence of arthropathy or other focal bone abnormality. Stranding is seen in subcutaneous fat along the medial aspect of the elbow. IMPRESSION: Negative for fracture or dislocation. Stranding in subcutaneous fat along the medial aspect of the elbow may be due to contusion. Electronically Signed   By: Inge Rise M.D.   On: 05/14/2019 13:27    ED Clinical Impression  1. Contusion of left elbow, initial encounter   2. Contusion of left upper arm, initial encounter      ED Assessment/Plan   Do not think that she has a humeral fracture however she has exquisite medial epicondyle tenderness.  Will x-ray elbow to  rule out fracture.  Reviewed imaging independently.  No Fracture. see radiology report for full details.  Patient with a contusion of the elbow and humerus.  There is no fracture on x-ray.  Home with Naprosyn 500 mg combined with 1 g of Tylenol since the ibuprofen 800 mg is not working well for her.  Ice for the next 72 hours, then heat.  Will write a work note for Sunday, Monday.  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  Also Ace wrap.  Discussed  imaging, MDM, treatment plan, and plan for follow-up with patient. patient agrees with plan.   Meds ordered  this encounter  Medications  . naproxen (NAPROSYN) 500 MG tablet    Sig: Take 1 tablet (500 mg total) by mouth 2 (two) times daily for 5 days.    Dispense:  10 tablet    Refill:  0    *This clinic note was created using [employer]. Therefore, there may be occasional mistakes despite careful proofreading.   ?   Melynda Ripple, MD 05/15/19 [phone]

## 2019-05-17 ENCOUNTER — Ambulatory Visit: Payer: Medicaid Other | Admitting: Nurse Practitioner

## 2019-05-17 ENCOUNTER — Encounter: Payer: Self-pay | Admitting: Nurse Practitioner

## 2019-05-17 ENCOUNTER — Other Ambulatory Visit: Payer: Self-pay

## 2019-05-17 VITALS — BP 132/88 | HR 84 | Temp 97.6°F | Ht 63.4 in | Wt 181.0 lb

## 2019-05-17 DIAGNOSIS — W009XXA Unspecified fall due to ice and snow, initial encounter: Secondary | ICD-10-CM | POA: Diagnosis not present

## 2019-05-17 DIAGNOSIS — M25522 Pain in left elbow: Secondary | ICD-10-CM | POA: Diagnosis not present

## 2019-05-17 DIAGNOSIS — E559 Vitamin D deficiency, unspecified: Secondary | ICD-10-CM

## 2019-05-17 DIAGNOSIS — Z23 Encounter for immunization: Secondary | ICD-10-CM | POA: Diagnosis not present

## 2019-05-17 DIAGNOSIS — W19XXXD Unspecified fall, subsequent encounter: Secondary | ICD-10-CM

## 2019-05-17 DIAGNOSIS — B351 Tinea unguium: Secondary | ICD-10-CM

## 2019-05-17 DIAGNOSIS — L03011 Cellulitis of right finger: Secondary | ICD-10-CM

## 2019-05-17 MED ORDER — CEPHALEXIN 500 MG PO CAPS: 500.0000 mg | ORAL_CAPSULE | Freq: Four times a day (QID) | ORAL | 0 refills | Status: AC

## 2019-05-17 MED ORDER — TETANUS-DIPHTH-ACELL PERTUSSIS 5-2.5-18.5 LF-MCG/0.5 IM SUSP
0.5000 mL | Freq: Once | INTRAMUSCULAR | Status: AC
  Administered 2019-05-17: 0.5 mL via INTRAMUSCULAR

## 2019-05-17 NOTE — Progress Notes (Signed)
This visit occurred during the SARS-CoV-2 public health emergency.  Safety protocols were in place, including screening questions prior to the visit, additional usage of staff PPE, and extensive cleaning of exam room while observing appropriate contact time as indicated for disinfecting solutions.  Subjective:     Patient ID: Lisa Nash , female    DOB: 02-24-1971 , 49 y.o.   MRN: [id number]   Chief Complaint  Patient presents with  . med check    patient presents today for a med check on lamisil    HPI  Here for follow up of nailbed discoloration and treatment for fungal infection.  On her right hand.  She is doing well with the terbinafine. She also has swelling and tenderness to right hand 2nd, 3rd and 4th fingernail cuticles.     Fall The accident occurred 5 to 7 days ago. Fall occurred: on ice. The point of impact was the left elbow. Pertinent negatives include no headaches. She has tried nothing for the symptoms.     Past Medical History:  Diagnosis Date  . Arthritis   . Asthma   . Eczema      Family History  Problem Relation Age of Onset  . Hypertension Mother   . Hypertension Father   . Arthritis Father      Current Outpatient Medications:  .  albuterol (PROVENTIL) (2.5 MG/3ML) 0.083% nebulizer solution, U 3 ML VIA NEB TID, Disp: , Rfl: 2 .  beclomethasone (QVAR) 80 MCG/ACT inhaler, Inhale 2 puffs into the lungs 2 (two) times daily., Disp: 1 Inhaler, Rfl: 5 .  cetirizine (ZYRTEC) 10 MG tablet, Take 10 mg by mouth daily., Disp: , Rfl:  .  CHANTIX STARTING MONTH PAK 0.5 MG X 11 & 1 MG X 42 tablet, TK UTD, Disp: , Rfl: 0 .  halobetasol (ULTRAVATE) 0.05 % ointment, Apply 1 application topically 2 (two) times daily as needed (allergic reaction, rash)., Disp: 45 g, Rfl: 0 .  montelukast (SINGULAIR) 10 MG tablet, TAKE 1 TABLET BY MOUTH EVERY DAY IN THE EVENING, Disp: 90 tablet, Rfl: 3 .  naproxen (NAPROSYN) 500 MG tablet, Take 1 tablet (500 mg total) by mouth 2  (two) times daily for 5 days., Disp: 10 tablet, Rfl: 0 .  Spacer/Aero-Holding Chambers (AEROCHAMBER PLUS FLO-VU MEDIUM) MISC, 1 each by Other route once. Use as directed, Disp: 1 each, Rfl: 1 .  terbinafine (LAMISIL) 250 MG tablet, Take 1 tablet (250 mg total) by mouth daily., Disp: 90 tablet, Rfl: 0 .  Vitamin D, Ergocalciferol, (DRISDOL) 1.25 MG (50000 UT) CAPS capsule, Take 1 capsule (50,000 Units total) by mouth 2 (two) times a week., Disp: 24 capsule, Rfl: 1   Allergies  Allergen Reactions  . Adhesive [Tape]   . Pollen Extract      Review of Systems  Constitutional: Negative.   Respiratory: Negative.   Cardiovascular: Negative.  Negative for chest pain, palpitations and leg swelling.  Musculoskeletal:       Left medial elbow pain  Skin:       Bruising to left upper arm and inner left arm at elbow.    Neurological: Negative for dizziness and headaches.  Psychiatric/Behavioral: Negative.      Today's Vitals   05/17/19 0847  BP: 132/88  Pulse: 84  Temp: 97.6 F (36.4 C)  TempSrc: Oral  Weight: 181 lb (82.1 kg)  Height: 5' 3.4" (1.61 m)  PainSc: 0-No pain   Body mass index is 31.66 kg/m.   Objective:  Physical Exam Constitutional:      Appearance: Normal appearance.  Cardiovascular:     Rate and Rhythm: Normal rate and regular rhythm.     Pulses: Normal pulses.     Heart sounds: Normal heart sounds. No murmur.  Pulmonary:     Effort: Pulmonary effort is normal. No respiratory distress.     Breath sounds: Normal breath sounds.  Musculoskeletal:        General: Tenderness (medial elbow) present.  Skin:    General: Skin is warm.     Capillary Refill: Capillary refill takes less than 2 seconds.     Findings: Bruising (left upper arm and medial elbow) present.     Comments: Right first, second and third finger at cuticles are swollen with dried cuticles, irritated and appears to have slight yellow color.  Nailbed are slightly yellow   Neurological:     General: No  focal deficit present.     Mental Status: She is alert and oriented to person, place, and time.  Psychiatric:        Mood and Affect: Mood normal.        Behavior: Behavior normal.        Thought Content: Thought content normal.        Judgment: Judgment normal.         Assessment And Plan:     1. Onychomycosis  Continue with terbinafine, will check liver functions there is some improvement - CMP14+EGFR  2. Vitamin D deficiency  Will check vitamin D level and supplement as needed.     Also encouraged to spend 15 minutes in the sun daily.  - Vitamin D (25 hydroxy)  3. Left elbow pain  Tenderness to elbow  Advised to take tylenol as needed  4. Fall, subsequent encounter  Fell on ice, overall doing better  5. Encounter for immunization  Will give tetanus vaccine today while in office. Refer to order management. TDAP will be administered to adults 19-26 years old every 10 years. - Tdap (BOOSTRIX) injection 0.5 mL   Minette Brine, FNP    THE PATIENT IS ENCOURAGED TO PRACTICE SOCIAL DISTANCING DUE TO THE COVID-19 PANDEMIC.

## 2019-05-17 NOTE — Patient Instructions (Signed)

## 2019-05-18 LAB — CMP14+EGFR
ALT: 18 IU/L (ref 0–32)
AST: 18 IU/L (ref 0–40)
Albumin/Globulin Ratio: 1.6 (ref 1.2–2.2)
Albumin: 4.3 g/dL (ref 3.8–4.8)
Alkaline Phosphatase: 76 IU/L (ref 39–117)
BUN/Creatinine Ratio: 11 (ref 9–23)
BUN: 10 mg/dL (ref 6–24)
Bilirubin Total: 0.2 mg/dL (ref 0.0–1.2)
CO2: 22 mmol/L (ref 20–29)
Calcium: 9.3 mg/dL (ref 8.7–10.2)
Chloride: 105 mmol/L (ref 96–106)
Creatinine, Ser: 0.88 mg/dL (ref 0.57–1.00)
GFR calc Af Amer: 90 mL/min/{1.73_m2} (ref >59)
GFR calc non Af Amer: 78 mL/min/{1.73_m2} (ref >59)
Globulin, Total: 2.7 g/dL (ref 1.5–4.5)
Glucose: 98 mg/dL (ref 65–99)
Potassium: 4.2 mmol/L (ref 3.5–5.2)
Sodium: 138 mmol/L (ref 134–144)
Total Protein: 7 g/dL (ref 6.0–8.5)

## 2019-05-18 LAB — VITAMIN D 25 HYDROXY (VIT D DEFICIENCY, FRACTURES): Vit D, 25-Hydroxy: 19.5 ng/mL — ABNORMAL LOW (ref 30.0–100.0)

## 2019-05-24 DIAGNOSIS — E66811 Obesity, class 1: Secondary | ICD-10-CM | POA: Insufficient documentation

## 2019-05-24 DIAGNOSIS — D219 Benign neoplasm of connective and other soft tissue, unspecified: Secondary | ICD-10-CM | POA: Insufficient documentation

## 2019-05-24 DIAGNOSIS — N393 Stress incontinence (female) (male): Secondary | ICD-10-CM | POA: Insufficient documentation

## 2019-05-24 DIAGNOSIS — E669 Obesity, unspecified: Secondary | ICD-10-CM | POA: Insufficient documentation

## 2019-05-24 DIAGNOSIS — I10 Essential (primary) hypertension: Secondary | ICD-10-CM | POA: Insufficient documentation

## 2019-05-25 ENCOUNTER — Other Ambulatory Visit: Payer: Self-pay

## 2019-05-25 ENCOUNTER — Encounter: Payer: Self-pay | Admitting: Nurse Practitioner

## 2019-05-25 DIAGNOSIS — E559 Vitamin D deficiency, unspecified: Secondary | ICD-10-CM

## 2019-05-25 MED ORDER — VITAMIN D (ERGOCALCIFEROL) 1.25 MG (50000 UNIT) PO CAPS: 50000.0000 [IU] | ORAL_CAPSULE | ORAL | 1 refills | Status: DC

## 2019-07-27 ENCOUNTER — Encounter: Payer: Self-pay | Admitting: Obstetrics

## 2019-08-16 ENCOUNTER — Encounter: Payer: Self-pay | Admitting: Internal Medicine

## 2019-08-16 ENCOUNTER — Ambulatory Visit (INDEPENDENT_AMBULATORY_CARE_PROVIDER_SITE_OTHER): Payer: Medicaid Other | Admitting: Internal Medicine

## 2019-08-16 ENCOUNTER — Other Ambulatory Visit: Payer: Self-pay

## 2019-08-16 VITALS — BP 132/78 | HR 87 | Temp 97.6°F | Ht 62.8 in | Wt 176.6 lb

## 2019-08-16 DIAGNOSIS — F4321 Adjustment disorder with depressed mood: Secondary | ICD-10-CM | POA: Diagnosis not present

## 2019-08-16 DIAGNOSIS — Z111 Encounter for screening for respiratory tuberculosis: Secondary | ICD-10-CM | POA: Diagnosis not present

## 2019-08-16 DIAGNOSIS — F5102 Adjustment insomnia: Secondary | ICD-10-CM | POA: Diagnosis not present

## 2019-08-16 DIAGNOSIS — I10 Essential (primary) hypertension: Secondary | ICD-10-CM

## 2019-08-16 DIAGNOSIS — Z6831 Body mass index (BMI) 31.0-31.9, adult: Secondary | ICD-10-CM

## 2019-08-16 DIAGNOSIS — Z634 Disappearance and death of family member: Secondary | ICD-10-CM

## 2019-08-16 MED ORDER — SERTRALINE HCL 25 MG PO TABS: ORAL_TABLET | ORAL | 0 refills | Status: DC

## 2019-08-18 LAB — QUANTIFERON-TB GOLD PLUS
QuantiFERON Mitogen Value: >10 IU/mL
QuantiFERON Nil Value: 0.06 IU/mL
QuantiFERON TB1 Ag Value: 0.05 IU/mL
QuantiFERON TB2 Ag Value: 0.06 IU/mL
QuantiFERON-TB Gold Plus: NEGATIVE

## 2019-08-18 LAB — CMP14+EGFR
ALT: 18 IU/L (ref 0–32)
AST: 20 IU/L (ref 0–40)
Albumin/Globulin Ratio: 1.7 (ref 1.2–2.2)
Albumin: 4.3 g/dL (ref 3.8–4.8)
Alkaline Phosphatase: 69 IU/L (ref 39–117)
BUN/Creatinine Ratio: 11 (ref 9–23)
BUN: 9 mg/dL (ref 6–24)
Bilirubin Total: 0.3 mg/dL (ref 0.0–1.2)
CO2: 21 mmol/L (ref 20–29)
Calcium: 9 mg/dL (ref 8.7–10.2)
Chloride: 104 mmol/L (ref 96–106)
Creatinine, Ser: 0.81 mg/dL (ref 0.57–1.00)
GFR calc Af Amer: 99 mL/min/{1.73_m2} (ref >59)
GFR calc non Af Amer: 86 mL/min/{1.73_m2} (ref >59)
Globulin, Total: 2.6 g/dL (ref 1.5–4.5)
Glucose: 78 mg/dL (ref 65–99)
Potassium: 4.3 mmol/L (ref 3.5–5.2)
Sodium: 138 mmol/L (ref 134–144)
Total Protein: 6.9 g/dL (ref 6.0–8.5)

## 2019-08-19 NOTE — Progress Notes (Signed)
This visit occurred during the SARS-CoV-2 public health emergency.  Safety protocols were in place, including screening questions prior to the visit, additional usage of staff PPE, and extensive cleaning of exam room while observing appropriate contact time as indicated for disinfecting solutions.  Subjective:     Patient ID: Lisa Nash , female    DOB: 1970/10/11 , 49 y.o.   MRN: [id number]   Chief Complaint  Patient presents with  . Hypertension  . grief    loss of son in [place]    HPI  She is here today for blood pressure check. She reports compliance with meds. She needs TB testing for work as well.    She is quite tearful today. Admits her son was killed Feb 2021.  The suspect has not been caught. She has not been sleeping. Her last day at work was 2/6, he was killed on 2/7. She has difficulty eating and sleeping. She has another son in [place].   Hypertension This is a chronic problem. The current episode started more than 1 year ago. The problem has been gradually improving since onset. The problem is controlled. Pertinent negatives include no blurred vision, chest pain, palpitations or shortness of breath. The current treatment provides moderate improvement. Compliance problems include exercise.      Past Medical History:  Diagnosis Date  . Arthritis   . Asthma   . Eczema      Family History  Problem Relation Age of Onset  . Hypertension Mother   . Hypertension Father   . Arthritis Father      Current Outpatient Medications:  .  albuterol (PROVENTIL) (2.5 MG/3ML) 0.083% nebulizer solution, U 3 ML VIA NEB TID, Disp: , Rfl: 2 .  beclomethasone (QVAR) 80 MCG/ACT inhaler, Inhale 2 puffs into the lungs 2 (two) times daily., Disp: 1 Inhaler, Rfl: 5 .  cetirizine (ZYRTEC) 10 MG tablet, Take 10 mg by mouth daily., Disp: , Rfl:  .  CHANTIX STARTING MONTH PAK 0.5 MG X 11 & 1 MG X 42 tablet, TK UTD, Disp: , Rfl: 0 .  halobetasol (ULTRAVATE) 0.05 % ointment, Apply 1  application topically 2 (two) times daily as needed (allergic reaction, rash)., Disp: 45 g, Rfl: 0 .  montelukast (SINGULAIR) 10 MG tablet, TAKE 1 TABLET BY MOUTH EVERY DAY IN THE EVENING, Disp: 90 tablet, Rfl: 3 .  Spacer/Aero-Holding Chambers (AEROCHAMBER PLUS FLO-VU MEDIUM) MISC, 1 each by Other route once. Use as directed, Disp: 1 each, Rfl: 1 .  Vitamin D, Ergocalciferol, (DRISDOL) 1.25 MG (50000 UNIT) CAPS capsule, Take 1 capsule (50,000 Units total) by mouth 2 (two) times a week., Disp: 24 capsule, Rfl: 1 .  sertraline (ZOLOFT) 25 MG tablet, One tab po qd x 7 days, then 2 tabs po daily, Disp: 60 tablet, Rfl: 0 .  terbinafine (LAMISIL) 250 MG tablet, Take 1 tablet (250 mg total) by mouth daily. (Patient not taking: Reported on 08/16/2019), Disp: 90 tablet, Rfl: 0   Allergies  Allergen Reactions  . Adhesive [Tape]   . Pollen Extract      Review of Systems  Constitutional: Negative.   Eyes: Negative for blurred vision.  Respiratory: Negative.  Negative for shortness of breath.   Cardiovascular: Negative.  Negative for chest pain and palpitations.  Gastrointestinal: Negative.   Neurological: Negative.   Psychiatric/Behavioral: Positive for dysphoric mood.       Tearful. She is grieving the loss of her son. He was killed on 2/7. He was 28 years  old.      Today's Vitals   08/16/19 0844  BP: 132/78  Pulse: 87  Temp: 97.6 F (36.4 C)  TempSrc: Oral  Weight: 176 lb 9.6 oz (80.1 kg)  Height: 5' 2.8" (1.595 m)   Body mass index is 31.48 kg/m.   Objective:  Physical Exam Vitals and nursing note reviewed.  Constitutional:      Appearance: Normal appearance.  HENT:     Head: Normocephalic and atraumatic.  Cardiovascular:     Rate and Rhythm: Normal rate and regular rhythm.     Heart sounds: Normal heart sounds.  Pulmonary:     Effort: Pulmonary effort is normal.     Breath sounds: Normal breath sounds.  Skin:    General: Skin is warm.  Neurological:     General: No focal  deficit present.     Mental Status: She is alert.  Psychiatric:        Behavior: Behavior normal.     Comments: She is tearful         Assessment And Plan:     1. Essential hypertension, benign  Chronic, fair control. She will continue with current meds. She is encouraged to avoid adding salt to her foods.  - CMP14+EGFR  2. Screening-pulmonary TB  She needs TB testing for work.   - QuantiFERON-TB Gold Plus  3. Grief at loss of child  She is encouraged to consider seeking grief counseling. She agrees to referral for counseling. I will start her on sertraline 79m daily x 7 days, then 2 tabs daily. She is advised to take nightly.  She will rto in four weeks for re-evaluation.   4. Adjustment insomnia  She is advised to take magnesium nightly. If her sx persist, despite starting sertraline 55m- I will add another medication as needed.   5. Body mass index (BMI) of 31.0-31.9 in adult  She is advised to strive to lose 10-15 pounds to decrease cardiac risk. She is encouraged to incorporate more exercise into her daily routine as tolerated.   RoMaximino GreenlandMD   I personally spent 30 minutes face-to-face and non-face-to-face in the care of this patient, which includes all pre-, intra-, and post visit time on the date of service.   THE PATIENT IS ENCOURAGED TO PRACTICE SOCIAL DISTANCING DUE TO THE COVID-19 PANDEMIC.

## 2019-09-01 ENCOUNTER — Encounter: Payer: Self-pay | Admitting: Nurse Practitioner

## 2019-09-01 ENCOUNTER — Encounter: Payer: Self-pay | Admitting: Internal Medicine

## 2019-09-13 ENCOUNTER — Ambulatory Visit: Payer: Medicaid Other | Admitting: Internal Medicine

## 2019-09-13 ENCOUNTER — Encounter: Payer: Self-pay | Admitting: Internal Medicine

## 2019-09-13 ENCOUNTER — Other Ambulatory Visit: Payer: Self-pay

## 2019-09-13 VITALS — BP 128/76 | HR 86 | Temp 98.1°F | Ht 62.8 in | Wt 177.0 lb

## 2019-09-13 DIAGNOSIS — M722 Plantar fascial fibromatosis: Secondary | ICD-10-CM

## 2019-09-13 DIAGNOSIS — F5102 Adjustment insomnia: Secondary | ICD-10-CM | POA: Diagnosis not present

## 2019-09-13 DIAGNOSIS — F4321 Adjustment disorder with depressed mood: Secondary | ICD-10-CM

## 2019-09-24 ENCOUNTER — Encounter: Payer: Self-pay | Admitting: Internal Medicine

## 2019-09-24 MED ORDER — SERTRALINE HCL 50 MG PO TABS
50.0000 mg | ORAL_TABLET | Freq: Every day | ORAL | 1 refills | Status: DC
Start: 2019-09-24 — End: 2019-12-20

## 2019-09-24 NOTE — Patient Instructions (Signed)
Plantar Fasciitis  Plantar fasciitis is a painful foot condition that affects the heel. It occurs when the band of tissue that connects the toes to the heel bone (plantar fascia) becomes irritated. This can happen as the result of exercising too much or doing other repetitive activities (overuse injury). The pain from plantar fasciitis can range from mild irritation to severe pain that makes it difficult to walk or move. The pain is usually worse in the morning after sleeping, or after sitting or lying down for a while. Pain may also be worse after long periods of walking or standing. What are the causes? This condition may be caused by:  Standing for long periods of time.  Wearing shoes that do not have good arch support.  Doing activities that put stress on joints (high-impact activities), including running, aerobics, and ballet.  Being overweight.  An abnormal way of walking (gait).  Tight muscles in the back of your lower leg (calf).  High arches in your feet.  Starting a new athletic activity. What are the signs or symptoms? The main symptom of this condition is heel pain. Pain may:  Be worse with first steps after a time of rest, especially in the morning after sleeping or after you have been sitting or lying down for a while.  Be worse after long periods of standing still.  Decrease after 30-45 minutes of activity, such as gentle walking. How is this diagnosed? This condition may be diagnosed based on your medical history and your symptoms. Your health care provider may ask questions about your activity level. Your health care provider will do a physical exam to check for:  A tender area on the bottom of your foot.  A high arch in your foot.  Pain when you move your foot.  Difficulty moving your foot. You may have imaging tests to confirm the diagnosis, such as:  X-rays.  Ultrasound.  MRI. How is this treated? Treatment for plantar fasciitis depends on how  severe your condition is. Treatment may include:  Rest, ice, applying pressure (compression), and raising the affected foot (elevation). This may be called RICE therapy. Your health care provider may recommend RICE therapy along with over-the-counter pain medicines to manage your pain.  Exercises to stretch your calves and your plantar fascia.  A splint that holds your foot in a stretched, upward position while you sleep (night splint).  Physical therapy to relieve symptoms and prevent problems in the future.  Injections of steroid medicine (cortisone) to relieve pain and inflammation.  Stimulating your plantar fascia with electrical impulses (extracorporeal shock wave therapy). This is usually the last treatment option before surgery.  Surgery, if other treatments have not worked after 12 months. Follow these instructions at home:  Managing pain, stiffness, and swelling  If directed, put ice on the painful area: ? Put ice in a plastic bag, or use a frozen bottle of water. ? Place a towel between your skin and the bag or bottle. ? Roll the bottom of your foot over the bag or bottle. ? Do this for 20 minutes, 2-3 times a day.  Wear athletic shoes that have air-sole or gel-sole cushions, or try wearing soft shoe inserts that are designed for plantar fasciitis.  Raise (elevate) your foot above the level of your heart while you are sitting or lying down. Activity  Avoid activities that cause pain. Ask your health care provider what activities are safe for you.  Do physical therapy exercises and stretches as told   by your health care provider.  Try activities and forms of exercise that are easier on your joints (low-impact). Examples include swimming, water aerobics, and biking. General instructions  Take over-the-counter and prescription medicines only as told by your health care provider.  Wear a night splint while sleeping, if told by your health care provider. Loosen the splint  if your toes tingle, become numb, or turn cold and blue.  Maintain a healthy weight, or work with your health care provider to lose weight as needed.  Keep all follow-up visits as told by your health care provider. This is important. Contact a health care provider if you:  Have symptoms that do not go away after caring for yourself at home.  Have pain that gets worse.  Have pain that affects your ability to move or do your daily activities. Summary  Plantar fasciitis is a painful foot condition that affects the heel. It occurs when the band of tissue that connects the toes to the heel bone (plantar fascia) becomes irritated.  The main symptom of this condition is heel pain that may be worse after exercising too much or standing still for a long time.  Treatment varies, but it usually starts with rest, ice, compression, and elevation (RICE therapy) and over-the-counter medicines to manage pain. This information is not intended to replace advice given to you by your health care provider. Make sure you discuss any questions you have with your health care provider. Document Revised: 04/03/2017 Document Reviewed: 02/16/2017 [employer]vier Patient Education  2020 [employer]  

## 2019-09-24 NOTE — Progress Notes (Signed)
This visit occurred during the SARS-CoV-2 public health emergency.  Safety protocols were in place, including screening questions prior to the visit, additional usage of staff PPE, and extensive cleaning of exam room while observing appropriate contact time as indicated for disinfecting solutions.  Subjective:     Patient ID: Lisa Nash , female    DOB: 09-Aug-1970 , 49 y.o.   MRN: [id number]   Chief Complaint  Patient presents with  . Zoloft f/u  . Medication refill    meloxicam    HPI  She is here today for f/u zoloft. She was started on this to help her deal with the grief of the loss of her son. Unfortunately, he was killed in Feb 2021. She is interested in grief counseling, but unable to find provider that will offer in-person therapy.   Additionally, she c/o left foot pain. There is pain with ambulation. Wants refill of meloxicam.     Past Medical History:  Diagnosis Date  . Arthritis   . Asthma   . Eczema      Family History  Problem Relation Age of Onset  . Hypertension Mother   . Hypertension Father   . Arthritis Father      Current Outpatient Medications:  .  albuterol (PROVENTIL) (2.5 MG/3ML) 0.083% nebulizer solution, U 3 ML VIA NEB TID, Disp: , Rfl: 2 .  beclomethasone (QVAR) 80 MCG/ACT inhaler, Inhale 2 puffs into the lungs 2 (two) times daily., Disp: 1 Inhaler, Rfl: 5 .  cetirizine (ZYRTEC) 10 MG tablet, Take 10 mg by mouth daily., Disp: , Rfl:  .  CHANTIX STARTING MONTH PAK 0.5 MG X 11 & 1 MG X 42 tablet, TK UTD, Disp: , Rfl: 0 .  halobetasol (ULTRAVATE) 0.05 % ointment, Apply 1 application topically 2 (two) times daily as needed (allergic reaction, rash)., Disp: 45 g, Rfl: 0 .  montelukast (SINGULAIR) 10 MG tablet, TAKE 1 TABLET BY MOUTH EVERY DAY IN THE EVENING, Disp: 90 tablet, Rfl: 3 .  Spacer/Aero-Holding Chambers (AEROCHAMBER PLUS FLO-VU MEDIUM) MISC, 1 each by Other route once. Use as directed, Disp: 1 each, Rfl: 1 .  Vitamin D,  Ergocalciferol, (DRISDOL) 1.25 MG (50000 UNIT) CAPS capsule, Take 1 capsule (50,000 Units total) by mouth 2 (two) times a week., Disp: 24 capsule, Rfl: 1 .  MOBIC 15 MG tablet, Take 15 mg by mouth daily., Disp: , Rfl:  .  sertraline (ZOLOFT) 50 MG tablet, Take 1 tablet (50 mg total) by mouth daily., Disp: 90 tablet, Rfl: 1   Allergies  Allergen Reactions  . Adhesive [Tape]   . Pollen Extract      Review of Systems  Constitutional: Negative.   Respiratory: Negative.   Cardiovascular: Negative.   Gastrointestinal: Negative.   Musculoskeletal:       C/o left foot pain. Has pain with ambulation, esp when getting out of bed in am. Denies recent fall/trauma. Has seen Dr. Alfonso Ramus in the past.   Neurological: Negative.   Psychiatric/Behavioral: Negative.      Today's Vitals   09/13/19 1144  BP: 128/76  Pulse: 86  Temp: 98.1 F (36.7 C)  TempSrc: Oral  SpO2: 98%  Weight: 177 lb (80.3 kg)  Height: 5' 2.8" (1.595 m)   Body mass index is 31.55 kg/m.   Objective:  Physical Exam Vitals and nursing note reviewed.  Constitutional:      Appearance: Normal appearance.  HENT:     Head: Normocephalic and atraumatic.  Cardiovascular:  Rate and Rhythm: Normal rate and regular rhythm.     Heart sounds: Normal heart sounds.  Pulmonary:     Effort: Pulmonary effort is normal.     Breath sounds: Normal breath sounds.  Skin:    General: Skin is warm.  Neurological:     General: No focal deficit present.     Mental Status: She is alert.  Psychiatric:        Mood and Affect: Mood normal.        Behavior: Behavior normal.         Assessment And Plan:     1. Adjustment disorder with depressed mood  She will continue with sertraline, 50mg  daily. Unfortunately, she has yet to get psychology appt with in-person provider. She does not wish to have virtual evaluation. She will continue with sertraline 50mg  daily. I will continue to try to find a provider that offers in-person  evaluations.   2. Adjustment insomnia  She is encouraged to try magnesium nightly. Her sx have somewhat improved with use of sertraline.   3. Plantar fasciitis of left foot  Acute flare of chronic condition. She was given stretching exercises to perform upon awakening and during the day. I will refer her to Dr. Alfonso Ramus as requested. Pt advised she may benefit from use of orthotics. She is advised to use meloxicam sparingly while on sertraline. Advised of possible drug interaction with continued simultaneous use of both products.   - Ambulatory referral to [place], MD    THE PATIENT IS ENCOURAGED TO PRACTICE SOCIAL DISTANCING DUE TO THE COVID-19 PANDEMIC.

## 2019-10-19 ENCOUNTER — Other Ambulatory Visit: Payer: Self-pay

## 2019-10-19 ENCOUNTER — Telehealth: Payer: BLUE CROSS/BLUE SHIELD

## 2019-10-19 ENCOUNTER — Telehealth: Payer: Medicaid Other

## 2019-10-31 ENCOUNTER — Encounter: Payer: Medicaid Other | Admitting: Nurse Practitioner

## 2019-10-31 ENCOUNTER — Encounter: Payer: Self-pay | Admitting: Nurse Practitioner

## 2019-10-31 ENCOUNTER — Other Ambulatory Visit: Payer: Self-pay

## 2019-11-02 ENCOUNTER — Other Ambulatory Visit: Payer: Self-pay

## 2019-11-02 ENCOUNTER — Encounter: Payer: Self-pay | Admitting: Internal Medicine

## 2019-11-02 ENCOUNTER — Ambulatory Visit: Payer: Medicaid Other | Admitting: Internal Medicine

## 2019-11-02 VITALS — BP 164/92 | HR 98 | Temp 98.8°F | Ht 61.8 in | Wt 180.4 lb

## 2019-11-02 DIAGNOSIS — I10 Essential (primary) hypertension: Secondary | ICD-10-CM

## 2019-11-02 DIAGNOSIS — R7309 Other abnormal glucose: Secondary | ICD-10-CM | POA: Diagnosis not present

## 2019-11-02 DIAGNOSIS — F4321 Adjustment disorder with depressed mood: Secondary | ICD-10-CM | POA: Diagnosis not present

## 2019-11-02 DIAGNOSIS — Z634 Disappearance and death of family member: Secondary | ICD-10-CM

## 2019-11-02 DIAGNOSIS — L659 Nonscarring hair loss, unspecified: Secondary | ICD-10-CM | POA: Diagnosis not present

## 2019-11-02 MED ORDER — QVAR 80 MCG/ACT IN AERS: 2.0000 | INHALATION_SPRAY | Freq: Two times a day (BID) | RESPIRATORY_TRACT | 5 refills | Status: DC

## 2019-11-02 MED ORDER — AEROCHAMBER PLUS FLO-VU MEDIUM MISC: 1.0000 | Freq: Once | 1 refills | Status: AC

## 2019-11-02 NOTE — Patient Instructions (Signed)
Salon Crie  [place]  [email or web address]

## 2019-11-03 LAB — THYROID PANEL WITH TSH
Free Thyroxine Index: 1.7 (ref 1.2–4.9)
T3 Uptake Ratio: 21 % — ABNORMAL LOW (ref 24–39)
T4, Total: 8 ug/dL (ref 4.5–12.0)
TSH: 1.13 u[IU]/mL (ref 0.450–4.500)

## 2019-11-03 LAB — CBC
Hematocrit: 38 % (ref 34.0–46.6)
Hemoglobin: 12 g/dL (ref 11.1–15.9)
MCH: 27.9 pg (ref 26.6–33.0)
MCHC: 31.6 g/dL (ref 31.5–35.7)
MCV: 88 fL (ref 79–97)
Platelets: 375 10*3/uL (ref 150–450)
RBC: 4.3 x10E6/uL (ref 3.77–5.28)
RDW: 15.5 % — ABNORMAL HIGH (ref 11.7–15.4)
WBC: 8.8 10*3/uL (ref 3.4–10.8)

## 2019-11-03 LAB — IRON AND TIBC
Iron Saturation: 13 % — ABNORMAL LOW (ref 15–55)
Iron: 53 ug/dL (ref 27–159)
Total Iron Binding Capacity: 413 ug/dL (ref 250–450)
UIBC: 360 ug/dL (ref 131–425)

## 2019-11-03 LAB — ANTINUCLEAR ANTIBODIES, IFA: ANA Titer 1: NEGATIVE

## 2019-11-14 ENCOUNTER — Encounter: Payer: Self-pay | Admitting: Internal Medicine

## 2019-11-16 DIAGNOSIS — L659 Nonscarring hair loss, unspecified: Secondary | ICD-10-CM | POA: Insufficient documentation

## 2019-11-16 DIAGNOSIS — Z634 Disappearance and death of family member: Secondary | ICD-10-CM | POA: Insufficient documentation

## 2019-11-16 DIAGNOSIS — F4321 Adjustment disorder with depressed mood: Secondary | ICD-10-CM | POA: Insufficient documentation

## 2019-11-16 DIAGNOSIS — I10 Essential (primary) hypertension: Secondary | ICD-10-CM | POA: Insufficient documentation

## 2019-11-16 NOTE — Progress Notes (Signed)
This visit occurred during the SARS-CoV-2 public health emergency.  Safety protocols were in place, including screening questions prior to the visit, additional usage of staff PPE, and extensive cleaning of exam room while observing appropriate contact time as indicated for disinfecting solutions.  Subjective:     Patient ID: Lisa Nash , female    DOB: [birth year] , 49 y.o.   MRN: [id number]   Chief Complaint  Patient presents with  . Grief    HPI  She presents today for f/u grief. Unfortunately, she has been unable to get grief counseling thus far. She prefers face to face visits, and so far, all mental health providers in the area seem to be only offering virtual visits.     Past Medical History:  Diagnosis Date  . Arthritis   . Asthma   . Eczema      Family History  Problem Relation Age of Onset  . Hypertension Mother   . Hypertension Father   . Arthritis Father      Current Outpatient Medications:  .  albuterol (PROVENTIL) (2.5 MG/3ML) 0.083% nebulizer solution, U 3 ML VIA NEB TID, Disp: , Rfl: 2 .  beclomethasone (QVAR) 80 MCG/ACT inhaler, Inhale 2 puffs into the lungs 2 (two) times daily., Disp: 1 Inhaler, Rfl: 5 .  cetirizine (ZYRTEC) 10 MG tablet, Take 10 mg by mouth daily., Disp: , Rfl:  .  CHANTIX STARTING MONTH PAK 0.5 MG X 11 & 1 MG X 42 tablet, TK UTD, Disp: , Rfl: 0 .  halobetasol (ULTRAVATE) 0.05 % ointment, Apply 1 application topically 2 (two) times daily as needed (allergic reaction, rash)., Disp: 45 g, Rfl: 0 .  MOBIC 15 MG tablet, Take 15 mg by mouth daily., Disp: , Rfl:  .  montelukast (SINGULAIR) 10 MG tablet, TAKE 1 TABLET BY MOUTH EVERY DAY IN THE EVENING, Disp: 90 tablet, Rfl: 3 .  sertraline (ZOLOFT) 50 MG tablet, Take 1 tablet (50 mg total) by mouth daily., Disp: 90 tablet, Rfl: 1 .  Vitamin D, Ergocalciferol, (DRISDOL) 1.25 MG (50000 UNIT) CAPS capsule, Take 1 capsule (50,000 Units total) by mouth 2 (two) times a week., Disp: 24 capsule,  Rfl: 1   Allergies  Allergen Reactions  . Adhesive [Tape]   . Pollen Extract      Review of Systems  Constitutional: Negative.   Respiratory: Negative.   Cardiovascular: Negative.   Gastrointestinal: Negative.   Skin:       She c/o extreme hair loss. She had her hair braided in two braids 2 weeks ago. When she removed the braids, most of her hair came out too. She admits she has been under a great deal of stress since her son's death in 07/12/22. She has been wearing a wig for the past two weeks.   Neurological: Negative.   Psychiatric/Behavioral: Negative.      Today's Vitals   11/02/19 1451  BP: (!) 164/92  Pulse: 98  Temp: 98.8 F (37.1 C)  TempSrc: Oral  Weight: 180 lb 6.4 oz (81.8 kg)  Height: 5' 1.8" (1.57 m)   Body mass index is 33.21 kg/m.   Objective:  Physical Exam Vitals and nursing note reviewed.  Constitutional:      Appearance: Normal appearance.  HENT:     Head: Normocephalic and atraumatic.  Cardiovascular:     Rate and Rhythm: Normal rate and regular rhythm.     Heart sounds: Normal heart sounds.  Pulmonary:     Effort: Pulmonary effort is  normal.     Breath sounds: Normal breath sounds.  Skin:    General: Skin is warm.  Neurological:     General: No focal deficit present.     Mental Status: She is alert.  Psychiatric:        Mood and Affect: Mood normal.        Behavior: Behavior normal.         Assessment And Plan:     Problem List Items Addressed This Visit      Cardiovascular and Mediastinum   Essential hypertension, benign     Other   Grief at loss of child - Primary   Adjustment disorder with depressed mood   Hair loss   Relevant Orders   ANA, IFA (with reflex) (Completed)   Ambulatory referral to Dermatology   Thyroid Panel With TSH (Completed)   CBC no Diff (Completed)   Iron and IBC ([id number]) (Completed)    Other Visit Diagnoses    Other abnormal glucose          PLAN:  1. Grief - I will continue to look for  mental health provider that performs face to face visits. She will continue with sertraline daily.   2.  Adjustment disorder with depressed mood - Please see #1.   3. Hair loss - She agrees to [facility] referral.  I will check labwork and treat as indicated.     4. Essential hypertension, benign - Uncontrolled. She is encouraged to take meds as directed. Also advised to avoid adding salt to her foods.   5. Other abnormal glucose - she is encouraged to avoid sugary beverages, including diet. Encouraged to try to incorporate more exercise into her daily routine.     Patient was given opportunity to ask questions. Patient verbalized understanding of the plan and was able to repeat key elements of the plan. All questions were answered to their satisfaction.   [name], MD   I, [name], MD, have reviewed all documentation for this visit. The documentation on 11/16/19 for the exam, diagnosis, procedures, and orders are all accurate and complete.  THE PATIENT IS ENCOURAGED TO PRACTICE SOCIAL DISTANCING DUE TO THE COVID-19 PANDEMIC.

## 2019-11-25 ENCOUNTER — Telehealth: Payer: Medicaid Other | Admitting: Family

## 2019-11-25 ENCOUNTER — Ambulatory Visit (HOSPITAL_COMMUNITY): Payer: Self-pay

## 2019-11-25 DIAGNOSIS — R059 Cough, unspecified: Secondary | ICD-10-CM

## 2019-11-25 DIAGNOSIS — R05 Cough: Secondary | ICD-10-CM

## 2019-11-25 MED ORDER — BENZONATATE 200 MG PO CAPS
200.0000 mg | ORAL_CAPSULE | Freq: Two times a day (BID) | ORAL | 0 refills | Status: DC | PRN
Start: 2019-11-25 — End: 2019-11-25

## 2019-11-25 MED ORDER — PROMETHAZINE-DM 6.25-15 MG/5ML PO SYRP: 5.0000 mL | ORAL_SOLUTION | Freq: Four times a day (QID) | ORAL | 0 refills | Status: DC | PRN

## 2019-11-25 MED ORDER — PREDNISONE 10 MG (21) PO TBPK
ORAL_TABLET | ORAL | 0 refills | Status: DC
Start: 2019-11-25 — End: 2019-12-20

## 2019-11-25 NOTE — Addendum Note (Signed)
Addended by: Dutch Quint B on: 11/25/2019 02:19 PM   Modules accepted: Orders

## 2019-11-25 NOTE — Progress Notes (Signed)
We are sorry that you are not feeling well.  Here is how we plan to help!  Based on your presentation I believe you most likely have A cough due to a virus.  This is called viral bronchitis and is best treated by rest, plenty of fluids and control of the cough.  You may use Ibuprofen or Tylenol as directed to help your symptoms.     In addition you may use A prescription cough medication called Tessalon Perles 100mg. You may take 1-2 capsules every 8 hours as needed for your cough.  Prednisone 10 mg daily for 6 days (see taper instructions below)  Directions for 6 day taper: Day 1: 2 tablets before breakfast, 1 after both lunch & dinner and 2 at bedtime Day 2: 1 tab before breakfast, 1 after both lunch & dinner and 2 at bedtime Day 3: 1 tab at each meal & 1 at bedtime Day 4: 1 tab at breakfast, 1 at lunch, 1 at bedtime Day 5: 1 tab at breakfast & 1 tab at bedtime Day 6: 1 tab at breakfast   From your responses in the eVisit questionnaire you describe inflammation in the upper respiratory tract which is causing a significant cough.  This is commonly called Bronchitis and has four common causes:    Allergies  Viral Infections  Acid Reflux  Bacterial Infection Allergies, viruses and acid reflux are treated by controlling symptoms or eliminating the cause. An example might be a cough caused by taking certain blood pressure medications. You stop the cough by changing the medication. Another example might be a cough caused by acid reflux. Controlling the reflux helps control the cough.  USE OF BRONCHODILATOR ("RESCUE") INHALERS: There is a risk from using your bronchodilator too frequently.  The risk is that over-reliance on a medication which only relaxes the muscles surrounding the breathing tubes can reduce the effectiveness of medications prescribed to reduce swelling and congestion of the tubes themselves.  Although you feel brief relief from the bronchodilator inhaler, your asthma may  actually be worsening with the tubes becoming more swollen and filled with mucus.  This can delay other crucial treatments, such as oral steroid medications. If you need to use a bronchodilator inhaler daily, several times per day, you should discuss this with your provider.  There are probably better treatments that could be used to keep your asthma under control.     HOME CARE . Only take medications as instructed by your medical team. . Complete the entire course of an antibiotic. . Drink plenty of fluids and get plenty of rest. . Avoid close contacts especially the very young and the elderly . Cover your mouth if you cough or cough into your sleeve. . Always remember to wash your hands . A steam or ultrasonic humidifier can help congestion.   GET HELP RIGHT AWAY IF: . You develop worsening fever. . You become short of breath . You cough up blood. . Your symptoms persist after you have completed your treatment plan MAKE SURE YOU   Understand these instructions.  Will watch your condition.  Will get help right away if you are not doing well or get worse.  Your e-visit answers were reviewed by a board certified advanced clinical practitioner to complete your personal care plan.  Depending on the condition, your plan could have included both over the counter or prescription medications. If there is a problem please reply  once you have received a response from your provider. Your   safety is important to us.  If you have drug allergies check your prescription carefully.    You can use MyChart to ask questions about today's visit, request a non-urgent call back, or ask for a work or school excuse for 24 hours related to this e-Visit. If it has been greater than 24 hours you will need to follow up with your provider, or enter a new e-Visit to address those concerns. You will get an e-mail in the next two days asking about your experience.  I hope that your e-visit has been valuable and will  speed your recovery. Thank you for using e-visits.  Greater than 5 minutes, yet less than 10 minutes of time have been spent researching, coordinating, and implementing care for this patient today.  Thank you for the details you included in the comment boxes. Those details are very helpful in determining the best course of treatment for you and help us to provide the best care.  

## 2019-12-15 ENCOUNTER — Other Ambulatory Visit: Payer: Self-pay

## 2019-12-15 ENCOUNTER — Encounter: Payer: Self-pay | Admitting: Internal Medicine

## 2019-12-20 ENCOUNTER — Encounter: Payer: Self-pay | Admitting: Internal Medicine

## 2019-12-20 ENCOUNTER — Ambulatory Visit: Payer: Medicaid Other | Admitting: Internal Medicine

## 2019-12-20 ENCOUNTER — Other Ambulatory Visit: Payer: Self-pay

## 2019-12-20 VITALS — BP 140/86 | HR 91 | Temp 97.3°F | Ht 62.2 in | Wt 180.6 lb

## 2019-12-20 DIAGNOSIS — E6609 Other obesity due to excess calories: Secondary | ICD-10-CM | POA: Diagnosis not present

## 2019-12-20 DIAGNOSIS — Z6832 Body mass index (BMI) 32.0-32.9, adult: Secondary | ICD-10-CM | POA: Diagnosis not present

## 2019-12-20 DIAGNOSIS — D259 Leiomyoma of uterus, unspecified: Secondary | ICD-10-CM

## 2019-12-20 DIAGNOSIS — L03011 Cellulitis of right finger: Secondary | ICD-10-CM

## 2019-12-20 DIAGNOSIS — I10 Essential (primary) hypertension: Secondary | ICD-10-CM | POA: Diagnosis not present

## 2019-12-20 DIAGNOSIS — N921 Excessive and frequent menstruation with irregular cycle: Secondary | ICD-10-CM

## 2019-12-20 DIAGNOSIS — Z975 Presence of (intrauterine) contraceptive device: Secondary | ICD-10-CM

## 2019-12-20 DIAGNOSIS — F4321 Adjustment disorder with depressed mood: Secondary | ICD-10-CM

## 2019-12-20 MED ORDER — AMOXICILLIN-POT CLAVULANATE 875-125 MG PO TABS
1.0000 | ORAL_TABLET | Freq: Two times a day (BID) | ORAL | 0 refills | Status: AC
Start: 2019-12-20 — End: 2019-12-30

## 2019-12-20 MED ORDER — HYDROCHLOROTHIAZIDE 12.5 MG PO CAPS: 12.5000 mg | ORAL_CAPSULE | Freq: Every day | ORAL | 1 refills | Status: DC

## 2019-12-20 MED ORDER — SERTRALINE HCL 50 MG PO TABS: 50.0000 mg | ORAL_TABLET | Freq: Every day | ORAL | 1 refills | Status: DC

## 2019-12-20 NOTE — Progress Notes (Signed)
I,Tianna Badgett,acting as a Education administrator for [name], MD.,have documented all relevant documentation on the behalf of [name], MD,as directed by  [name], MD while in the presence of [name], MD.  This visit occurred during the SARS-CoV-2 public health emergency.  Safety protocols were in place, including screening questions prior to the visit, additional usage of staff PPE, and extensive cleaning of exam room while observing appropriate contact time as indicated for disinfecting solutions.  Subjective:     Patient ID: Lisa Nash , female    DOB: 07/07/1970 , 49 y.o.   MRN: [id number]   Chief Complaint  Patient presents with  . Hypertension    HPI  She is here today for blood pressure check. She reports compliance with meds. She would like to discuss nail fungus on three fingers acquired at  nail salon.    Hypertension This is a chronic problem. The current episode started more than 1 year ago. The problem has been gradually improving since onset. The problem is controlled. Pertinent negatives include no blurred vision, chest pain, palpitations or shortness of breath. The current treatment provides moderate improvement. Compliance problems include exercise.      Past Medical History:  Diagnosis Date  . Arthritis   . Asthma   . Eczema      Family History  Problem Relation Age of Onset  . Hypertension Mother   . Hypertension Father   . Arthritis Father      Current Outpatient Medications:  .  albuterol (PROVENTIL) (2.5 MG/3ML) 0.083% nebulizer solution, U 3 ML VIA NEB TID, Disp: , Rfl: 2 .  beclomethasone (QVAR) 80 MCG/ACT inhaler, Inhale 2 puffs into the lungs 2 (two) times daily., Disp: 1 Inhaler, Rfl: 5 .  cetirizine (ZYRTEC) 10 MG tablet, Take 10 mg by mouth daily., Disp: , Rfl:  .  CHANTIX STARTING MONTH PAK 0.5 MG X 11 & 1 MG X 42 tablet, TK UTD, Disp: , Rfl: 0 .  halobetasol (ULTRAVATE) 0.05 % ointment, Apply 1 application topically 2  (two) times daily as needed (allergic reaction, rash)., Disp: 45 g, Rfl: 0 .  MOBIC 15 MG tablet, Take 15 mg by mouth daily., Disp: , Rfl:  .  montelukast (SINGULAIR) 10 MG tablet, TAKE 1 TABLET BY MOUTH EVERY DAY IN THE EVENING, Disp: 90 tablet, Rfl: 3 .  promethazine-dextromethorphan (PROMETHAZINE-DM) 6.25-15 MG/5ML syrup, Take 5 mLs by mouth 4 (four) times daily as needed., Disp: 118 mL, Rfl: 0 .  sertraline (ZOLOFT) 50 MG tablet, Take 1 tablet (50 mg total) by mouth daily., Disp: 90 tablet, Rfl: 1 .  Vitamin D, Ergocalciferol, (DRISDOL) 1.25 MG (50000 UNIT) CAPS capsule, Take 1 capsule (50,000 Units total) by mouth 2 (two) times a week., Disp: 24 capsule, Rfl: 1 .  fluconazole (DIFLUCAN) 100 MG tablet, Take 1 tablet (100 mg total) by mouth daily for 10 days., Disp: 10 tablet, Rfl: 0 .  hydrochlorothiazide (MICROZIDE) 12.5 MG capsule, Take 1 capsule (12.5 mg total) by mouth daily., Disp: 90 capsule, Rfl: 1   Allergies  Allergen Reactions  . Adhesive [Tape]   . Pollen Extract      Review of Systems  Constitutional: Negative.   Eyes: Negative for blurred vision.  Respiratory: Negative.  Negative for shortness of breath.   Cardiovascular: Negative.  Negative for chest pain and palpitations.  Gastrointestinal: Negative.   Skin:       Nail fungus. 3 fingers. Acquired at nail salon  Neurological: Negative.      Today's Vitals   12/20/19 1605  BP: 140/86  Pulse: 91  Temp: (!) 97.3 F (36.3 C)  TempSrc: Oral  Weight: 180 lb 9.6 oz (81.9 kg)  Height: 5' 2.2" (1.58 m)   Body mass index is 32.82 kg/m.   Objective:  Physical Exam Vitals and nursing note reviewed.  Constitutional:      Appearance: Normal appearance. She is obese.  HENT:     Head: Normocephalic and atraumatic.  Cardiovascular:     Rate and Rhythm: Normal rate and regular rhythm.     Heart sounds: Normal heart sounds.  Pulmonary:     Effort: Pulmonary effort is normal.     Breath sounds: Normal breath sounds.   Skin:    General: Skin is warm.     Comments: Cuticles on 3rd/4th/5th fingers on R are swollen and red. No drainage. Nails are brittle.   Neurological:     General: No focal deficit present.     Mental Status: She is alert.  Psychiatric:        Mood and Affect: Mood normal.        Behavior: Behavior normal.         Assessment And Plan:     1. Essential hypertension, benign Comments: Chronic, improved control. She will continue with current meds. She is encouraged to avoid adding salt to her foods.   2. Adjustment disorder with depressed mood Comments: She is now in therapy, likes her therapist. She will continue with sertraline, refills sent.  3. Breakthrough bleeding with IUD Comments: Encouraged to keep f/u appt with GYN, recent GYN notes reviewed.   4. Uterine leiomyoma, unspecified location Comments: PT may need myomectomy. I am not sure if she is interested in uterine artery ablation. She will discuss further with GYN.   5. Class 1 obesity due to excess calories with serious comorbidity and body mass index (BMI) of 32.0 to 32.9 in adult Comments: Encouraged to strive for BMI less than 30 to decrease cardiac risk. Advised to aim for at least 150 minutes of exercise per week.   6. Paronychia of finger, right Comments: She was given rx Augmentin to take twice daily. Will consider Derm referral if her sx persist. Advised to avoid acrylic/SNS nails.     Patient was given opportunity to ask questions. Patient verbalized understanding of the plan and was able to repeat key elements of the plan. All questions were answered to their satisfaction.  [name], MD   I, [name], MD, have reviewed all documentation for this visit. The documentation on 01/08/20 for the exam, diagnosis, procedures, and orders are all accurate and complete.  THE PATIENT IS ENCOURAGED TO PRACTICE SOCIAL DISTANCING DUE TO THE COVID-19 PANDEMIC.

## 2019-12-20 NOTE — Patient Instructions (Signed)

## 2019-12-21 DIAGNOSIS — D251 Intramural leiomyoma of uterus: Secondary | ICD-10-CM | POA: Diagnosis not present

## 2019-12-21 DIAGNOSIS — N920 Excessive and frequent menstruation with regular cycle: Secondary | ICD-10-CM | POA: Diagnosis not present

## 2019-12-21 DIAGNOSIS — N393 Stress incontinence (female) (male): Secondary | ICD-10-CM | POA: Diagnosis not present

## 2020-01-03 ENCOUNTER — Ambulatory Visit: Payer: Medicaid Other

## 2020-01-03 ENCOUNTER — Other Ambulatory Visit: Payer: Medicaid Other

## 2020-01-03 ENCOUNTER — Other Ambulatory Visit: Payer: Self-pay

## 2020-01-03 ENCOUNTER — Encounter: Payer: Self-pay | Admitting: Internal Medicine

## 2020-01-03 VITALS — BP 132/88 | HR 91 | Temp 98.0°F | Ht 62.2 in | Wt 180.0 lb

## 2020-01-03 DIAGNOSIS — I10 Essential (primary) hypertension: Secondary | ICD-10-CM

## 2020-01-03 DIAGNOSIS — Z79899 Other long term (current) drug therapy: Secondary | ICD-10-CM | POA: Diagnosis not present

## 2020-01-03 NOTE — Progress Notes (Signed)
Pt here for b/p check today b/p 132/88 she is currently on hydrochlorothiazide 12.5 she has not taken her b/p medication yet. She would normally take it in the mornings but came straight to her appt from work.   RS: what time does she normally take her meds? she needs to be consistent. I think we need to add another medication. is she willing to do this? something that will protect her kidneys  She hasn't been home yet she works 3rd shift but she takes it every morning. she does not want to add another medication  RS: ok

## 2020-01-04 ENCOUNTER — Other Ambulatory Visit: Payer: Self-pay | Admitting: Internal Medicine

## 2020-01-04 LAB — BMP8+EGFR
BUN/Creatinine Ratio: 12 (ref 9–23)
BUN: 11 mg/dL (ref 6–24)
CO2: 21 mmol/L (ref 20–29)
Calcium: 9.2 mg/dL (ref 8.7–10.2)
Chloride: 103 mmol/L (ref 96–106)
Creatinine, Ser: 0.93 mg/dL (ref 0.57–1.00)
GFR calc Af Amer: 84 mL/min/{1.73_m2} (ref >59)
GFR calc non Af Amer: 73 mL/min/{1.73_m2} (ref >59)
Glucose: 87 mg/dL (ref 65–99)
Potassium: 4.1 mmol/L (ref 3.5–5.2)
Sodium: 137 mmol/L (ref 134–144)

## 2020-01-04 MED ORDER — FLUCONAZOLE 100 MG PO TABS
100.0000 mg | ORAL_TABLET | Freq: Every day | ORAL | 0 refills | Status: AC
Start: 2020-01-04 — End: 2020-01-14

## 2020-01-31 ENCOUNTER — Other Ambulatory Visit: Payer: Self-pay | Admitting: Nurse Practitioner

## 2020-01-31 ENCOUNTER — Other Ambulatory Visit: Payer: Self-pay | Admitting: Internal Medicine

## 2020-01-31 DIAGNOSIS — E559 Vitamin D deficiency, unspecified: Secondary | ICD-10-CM

## 2020-02-06 DIAGNOSIS — L218 Other seborrheic dermatitis: Secondary | ICD-10-CM | POA: Diagnosis not present

## 2020-02-06 DIAGNOSIS — L650 Telogen effluvium: Secondary | ICD-10-CM | POA: Diagnosis not present

## 2020-02-15 ENCOUNTER — Encounter: Payer: Self-pay | Admitting: Internal Medicine

## 2020-02-15 ENCOUNTER — Other Ambulatory Visit: Payer: Self-pay

## 2020-02-15 ENCOUNTER — Ambulatory Visit (INDEPENDENT_AMBULATORY_CARE_PROVIDER_SITE_OTHER): Payer: Medicaid Other | Admitting: Internal Medicine

## 2020-02-15 VITALS — BP 156/82 | HR 86 | Temp 97.9°F | Ht 63.2 in | Wt 186.4 lb

## 2020-02-15 DIAGNOSIS — E6609 Other obesity due to excess calories: Secondary | ICD-10-CM

## 2020-02-15 DIAGNOSIS — Z0000 Encounter for general adult medical examination without abnormal findings: Secondary | ICD-10-CM

## 2020-02-15 DIAGNOSIS — Z6832 Body mass index (BMI) 32.0-32.9, adult: Secondary | ICD-10-CM

## 2020-02-15 DIAGNOSIS — I10 Essential (primary) hypertension: Secondary | ICD-10-CM | POA: Diagnosis not present

## 2020-02-15 DIAGNOSIS — J45909 Unspecified asthma, uncomplicated: Secondary | ICD-10-CM

## 2020-02-15 LAB — POCT URINALYSIS DIPSTICK
Bilirubin, UA: NEGATIVE
Blood, UA: NEGATIVE
Glucose, UA: NEGATIVE
Ketones, UA: NEGATIVE
Leukocytes, UA: NEGATIVE
Nitrite, UA: NEGATIVE
Protein, UA: NEGATIVE
Spec Grav, UA: >=1.03 — AB (ref 1.010–1.025)
Urobilinogen, UA: 0.2 E.U./dL
pH, UA: 6 (ref 5.0–8.0)

## 2020-02-15 LAB — POCT UA - MICROALBUMIN
Albumin/Creatinine Ratio, Urine, POC: <30
Creatinine, POC: 100 mg/dL
Microalbumin Ur, POC: 30 mg/L

## 2020-02-15 MED ORDER — MONTELUKAST SODIUM 10 MG PO TABS: ORAL_TABLET | ORAL | 3 refills | Status: DC

## 2020-02-15 MED ORDER — CETIRIZINE HCL 10 MG PO TABS
10.0000 mg | ORAL_TABLET | Freq: Every day | ORAL | 2 refills | Status: DC
Start: 2020-02-15 — End: 2021-06-19

## 2020-02-15 NOTE — Progress Notes (Signed)
I,[name],acting as a Education administrator for Lisa Greenland, MD.,have documented all relevant documentation on the behalf of Lisa Greenland, MD,as directed by  Lisa Greenland, MD while in the presence of Lisa Greenland, MD.  This visit occurred during the SARS-CoV-2 public health emergency.  Safety protocols were in place, including screening questions prior to the visit, additional usage of staff PPE, and extensive cleaning of exam room while observing appropriate contact time as indicated for disinfecting solutions.  Subjective:     Patient ID: Lisa Nash , female    DOB: 05/05/1971 , 49 y.o.   MRN: [id number]   Chief Complaint  Patient presents with  . Annual Exam  . Hypertension    HPI  The patient is here today for a physical examination.  She is followed by GYN at [facility] for her pelvic exams. She reports she is now scheduled for hysterectomy due to menorrhagia.  Hypertension This is a chronic problem. The current episode started more than 1 year ago. The problem has been gradually improving since onset. The problem is controlled. Pertinent negatives include no blurred vision, chest pain, palpitations or shortness of breath. The current treatment provides moderate improvement. Compliance problems include exercise.      Past Medical History:  Diagnosis Date  . Arthritis   . Asthma   . Eczema      Family History  Problem Relation Age of Onset  . Hypertension Mother   . Hypertension Father   . Arthritis Father      Current Outpatient Medications:  .  albuterol (PROVENTIL) (2.5 MG/3ML) 0.083% nebulizer solution, U 3 ML VIA NEB TID, Disp: , Rfl: 2 .  beclomethasone (QVAR) 80 MCG/ACT inhaler, Inhale 2 puffs into the lungs 2 (two) times daily., Disp: 1 Inhaler, Rfl: 5 .  halobetasol (ULTRAVATE) 0.05 % ointment, APPLY EXTERNALLY TO THE AFFECTED AREA TWICE DAILY AS NEEDED FOR ALLERGIC REACTION OR RASH, Disp: 50 g, Rfl: 0 .  hydrochlorothiazide (MICROZIDE) 12.5 MG  capsule, Take 1 capsule (12.5 mg total) by mouth daily., Disp: 90 capsule, Rfl: 1 .  megestrol (MEGACE) 40 MG tablet, Take by mouth., Disp: , Rfl:  .  MOBIC 15 MG tablet, Take 15 mg by mouth daily., Disp: , Rfl:  .  Vitamin D, Ergocalciferol, (DRISDOL) 1.25 MG (50000 UNIT) CAPS capsule, TAKE 1 CAPSULE BY MOUTH 2 TIMES A WEEK, Disp: 24 capsule, Rfl: 1 .  cetirizine (ZYRTEC) 10 MG tablet, Take 1 tablet (10 mg total) by mouth daily., Disp: 90 tablet, Rfl: 2 .  CHANTIX STARTING MONTH PAK 0.5 MG X 11 & 1 MG X 42 tablet, TK UTD (Patient not taking: Reported on 02/15/2020), Disp: , Rfl: 0 .  montelukast (SINGULAIR) 10 MG tablet, TAKE 1 TABLET BY MOUTH EVERY DAY IN THE EVENING, Disp: 90 tablet, Rfl: 3 .  sertraline (ZOLOFT) 50 MG tablet, Take 1 tablet (50 mg total) by mouth daily. (Patient not taking: Reported on 02/15/2020), Disp: 90 tablet, Rfl: 1   Allergies  Allergen Reactions  . Adhesive [Tape]   . Pollen Extract       The patient states she uses none for birth control. Last LMP was No LMP recorded (lmp unknown).. Negative for Dysmenorrhea. Negative for: breast discharge, breast lump(s), breast pain and breast self exam. Associated symptoms include abnormal vaginal bleeding. Pertinent negatives include abnormal bleeding (hematology), anxiety, decreased libido, depression, difficulty falling sleep, dyspareunia, history of infertility, nocturia, sexual dysfunction, sleep disturbances, urinary incontinence, urinary urgency, vaginal discharge and  vaginal itching. Diet regular.The patient states her exercise level is   intermittent.   . The patient's tobacco use is:  Social History   Tobacco Use  Smoking Status Current Some Day Smoker  . Packs/day: 0.50  . Years: 6.00  . Pack years: 3.00  . Types: Cigarettes  . Last attempt to quit: 07/09/2005  . Years since quitting: 14.6  Smokeless Tobacco Never Used  Tobacco Comment   decrease number of cigs smoked daily  . She has been exposed to passive  smoke. The patient's alcohol use is:  Social History   Substance and Sexual Activity  Alcohol Use Yes  . Alcohol/week: 3.0 - 4.0 standard drinks  . Types: 1 Glasses of wine, 2 - 3 Cans of beer per week   Comment: drinks beer 2-3 times per week, wine occassionally   Review of Systems  Constitutional: Negative.   HENT: Negative.   Eyes: Negative.  Negative for blurred vision.  Respiratory: Negative.  Negative for shortness of breath.   Cardiovascular: Negative.  Negative for chest pain and palpitations.  Gastrointestinal: Negative.   Endocrine: Negative.   Genitourinary: Negative.   Musculoskeletal: Negative.   Skin: Negative.   Allergic/Immunologic: Negative.   Hematological: Negative.   Psychiatric/Behavioral: Negative.      Today's Vitals   02/15/20 1432  BP: (!) 156/82  Pulse: 86  Temp: 97.9 F (36.6 C)  TempSrc: Oral  Weight: 186 lb 6.4 oz (84.6 kg)  Height: 5' 3.2" (1.605 m)   Body mass index is 32.81 kg/m.  Wt Readings from Last 3 Encounters:  02/15/20 186 lb 6.4 oz (84.6 kg)  01/03/20 180 lb (81.6 kg)  12/20/19 180 lb 9.6 oz (81.9 kg)   Objective:  Physical Exam Constitutional:      General: She is not in acute distress.    Appearance: Normal appearance. She is well-developed. She is obese.  HENT:     Head: Normocephalic and atraumatic.     Right Ear: Hearing, tympanic membrane, ear canal and external ear normal. There is no impacted cerumen.     Left Ear: Hearing, tympanic membrane, ear canal and external ear normal. There is no impacted cerumen.     Nose:     Comments: Deferred, masked    Mouth/Throat:     Comments: Deferred, masked Eyes:     General: Lids are normal.     Extraocular Movements: Extraocular movements intact.     Conjunctiva/sclera: Conjunctivae normal.     Pupils: Pupils are equal, round, and reactive to light.     Funduscopic exam:    Right eye: No papilledema.        Left eye: No papilledema.  Neck:     Thyroid: No thyroid  mass.     Vascular: No carotid bruit.  Cardiovascular:     Rate and Rhythm: Normal rate and regular rhythm.     Pulses: Normal pulses.     Heart sounds: Normal heart sounds. No murmur heard.   Pulmonary:     Effort: Pulmonary effort is normal.     Breath sounds: Normal breath sounds.  Chest:     Breasts: Tanner Score is 5.        Right: Normal.        Left: Normal.  Abdominal:     General: Bowel sounds are normal. There is no distension.     Palpations: Abdomen is soft.     Tenderness: There is no abdominal tenderness.     Comments: Rounded,  soft  Musculoskeletal:        General: No swelling. Normal range of motion.     Cervical back: Full passive range of motion without pain, normal range of motion and neck supple.     Right lower leg: No edema.     Left lower leg: No edema.  Skin:    General: Skin is warm and dry.     Capillary Refill: Capillary refill takes less than 2 seconds.  Neurological:     General: No focal deficit present.     Mental Status: She is alert and oriented to person, place, and time.     Cranial Nerves: No cranial nerve deficit.     Sensory: No sensory deficit.  Psychiatric:        Mood and Affect: Mood normal.        Behavior: Behavior normal.        Thought Content: Thought content normal.        Judgment: Judgment normal.         Assessment And Plan:     1. Health maintenance examination Comments: A full exam was performed. Importance of monthly self breast exams was discussed with the patient. PATIENT IS ADVISED TO GET 30-45 MINUTES REGULAR EXERCISE NO LESS THAN FOUR TO FIVE DAYS PER WEEK - BOTH WEIGHTBEARING EXERCISES AND AEROBIC ARE RECOMMENDED.  PATIENT IS ADVISED TO FOLLOW A HEALTHY DIET WITH AT LEAST SIX FRUITS/VEGGIES PER DAY, DECREASE INTAKE OF RED MEAT, AND TO INCREASE FISH INTAKE TO TWO DAYS PER WEEK.  MEATS/FISH SHOULD NOT BE FRIED, BAKED OR BROILED IS PREFERABLE.  I SUGGEST WEARING SPF 50 SUNSCREEN ON EXPOSED PARTS AND ESPECIALLY WHEN  IN THE DIRECT SUNLIGHT FOR AN EXTENDED PERIOD OF TIME.  PLEASE AVOID FAST FOOD RESTAURANTS AND INCREASE YOUR WATER INTAKE.   2. Essential hypertension, benign Comments: Chronic, uncontrolled. EKG performed, NSR w/o acute changes. Encouraged to avoid adding salt to her foods. Advised to take meds as prescribed. She will rto in six months for re-evaluation.   - POCT Urinalysis Dipstick (81002) - POCT UA - Microalbumin - EKG 12-Lead - Lipid panel  3. Class 1 obesity due to excess calories with serious comorbidity and body mass index (BMI) of 32.0 to 32.9 in adult Comments: Encouraged to strive for BMI less than 29 to decrease cardiac risk. Advised to aim for at least 150 minutes of exercise per week.  Wt Readings from Last 3 Encounters:  02/15/20 186 lb 6.4 oz (84.6 kg)  01/03/20 180 lb (81.6 kg)  12/20/19 180 lb 9.6 oz (81.9 kg)       Patient was given opportunity to ask questions. Patient verbalized understanding of the plan and was able to repeat key elements of the plan. All questions were answered to their satisfaction.   Lisa Greenland, MD   I, Lisa Greenland, MD, have reviewed all documentation for this visit. The documentation on 02/25/20 for the exam, diagnosis, procedures, and orders are all accurate and complete.  THE PATIENT IS ENCOURAGED TO PRACTICE SOCIAL DISTANCING DUE TO THE COVID-19 PANDEMIC.

## 2020-02-15 NOTE — Patient Instructions (Signed)
Health Maintenance, Female Adopting a healthy lifestyle and getting preventive care are important in promoting health and wellness. Ask your health care provider about:  The right schedule for you to have regular tests and exams.  Things you can do on your own to prevent diseases and keep yourself healthy. What should I know about diet, weight, and exercise? Eat a healthy diet   Eat a diet that includes plenty of vegetables, fruits, low-fat dairy products, and lean protein.  Do not eat a lot of foods that are high in solid fats, added sugars, or sodium. Maintain a healthy weight Body mass index (BMI) is used to identify weight problems. It estimates body fat based on height and weight. Your health care provider can help determine your BMI and help you achieve or maintain a healthy weight. Get regular exercise Get regular exercise. This is one of the most important things you can do for your health. Most adults should:  Exercise for at least 150 minutes each week. The exercise should increase your heart rate and make you sweat (moderate-intensity exercise).  Do strengthening exercises at least twice a week. This is in addition to the moderate-intensity exercise.  Spend less time sitting. Even light physical activity can be beneficial. Watch cholesterol and blood lipids Have your blood tested for lipids and cholesterol at 49 years of age, then have this test every 5 years. Have your cholesterol levels checked more often if:  Your lipid or cholesterol levels are high.  You are older than 49 years of age.  You are at high risk for heart disease. What should I know about cancer screening? Depending on your health history and family history, you may need to have cancer screening at various ages. This may include screening for:  Breast cancer.  Cervical cancer.  Colorectal cancer.  Skin cancer.  Lung cancer. What should I know about heart disease, diabetes, and high blood  pressure? Blood pressure and heart disease  High blood pressure causes heart disease and increases the risk of stroke. This is more likely to develop in people who have high blood pressure readings, are of African descent, or are overweight.  Have your blood pressure checked: ? Every 3-5 years if you are 18-39 years of age. ? Every year if you are 40 years old or older. Diabetes Have regular diabetes screenings. This checks your fasting blood sugar level. Have the screening done:  Once every three years after age 40 if you are at a normal weight and have a low risk for diabetes.  More often and at a younger age if you are overweight or have a high risk for diabetes. What should I know about preventing infection? Hepatitis B If you have a higher risk for hepatitis B, you should be screened for this virus. Talk with your health care provider to find out if you are at risk for hepatitis B infection. Hepatitis C Testing is recommended for:  Everyone born from 1945 through 1965.  Anyone with known risk factors for hepatitis C. Sexually transmitted infections (STIs)  Get screened for STIs, including gonorrhea and chlamydia, if: ? You are sexually active and are younger than 49 years of age. ? You are older than 49 years of age and your health care provider tells you that you are at risk for this type of infection. ? Your sexual activity has changed since you were last screened, and you are at increased risk for chlamydia or gonorrhea. Ask your health care provider if   you are at risk.  Ask your health care provider about whether you are at high risk for HIV. Your health care provider may recommend a prescription medicine to help prevent HIV infection. If you choose to take medicine to prevent HIV, you should first get tested for HIV. You should then be tested every 3 months for as long as you are taking the medicine. Pregnancy  If you are about to stop having your period (premenopausal) and  you may become pregnant, seek counseling before you get pregnant.  Take 400 to 800 micrograms (mcg) of folic acid every day if you become pregnant.  Ask for birth control (contraception) if you want to prevent pregnancy. Osteoporosis and menopause Osteoporosis is a disease in which the bones lose minerals and strength with aging. This can result in bone fractures. If you are 65 years old or older, or if you are at risk for osteoporosis and fractures, ask your health care provider if you should:  Be screened for bone loss.  Take a calcium or vitamin D supplement to lower your risk of fractures.  Be given hormone replacement therapy (HRT) to treat symptoms of menopause. Follow these instructions at home: Lifestyle  Do not use any products that contain nicotine or tobacco, such as cigarettes, e-cigarettes, and chewing tobacco. If you need help quitting, ask your health care provider.  Do not use street drugs.  Do not share needles.  Ask your health care provider for help if you need support or information about quitting drugs. Alcohol use  Do not drink alcohol if: ? Your health care provider tells you not to drink. ? You are pregnant, may be pregnant, or are planning to become pregnant.  If you drink alcohol: ? Limit how much you use to 0-1 drink a day. ? Limit intake if you are breastfeeding.  Be aware of how much alcohol is in your drink. In the [place] one drink equals one 12 oz bottle of beer (355 mL), one 5 oz glass of wine (148 mL), or one 1 oz glass of hard liquor (44 mL). General instructions  Schedule regular health, dental, and eye exams.  Stay current with your vaccines.  Tell your health care provider if: ? You often feel depressed. ? You have ever been abused or do not feel safe at home. Summary  Adopting a healthy lifestyle and getting preventive care are important in promoting health and wellness.  Follow your health care provider's instructions about healthy  diet, exercising, and getting tested or screened for diseases.  Follow your health care provider's instructions on monitoring your cholesterol and blood pressure. This information is not intended to replace advice given to you by your health care provider. Make sure you discuss any questions you have with your health care provider. Document Revised: 04/14/2018 Document Reviewed: 04/14/2018 Elsevier Patient Education  2020 [employer] Inc[employer]  

## 2020-02-16 LAB — LIPID PANEL
Chol/HDL Ratio: 4.4 ratio (ref 0.0–4.4)
Cholesterol, Total: 149 mg/dL (ref 100–199)
HDL: 34 mg/dL — ABNORMAL LOW
LDL Chol Calc (NIH): 96 mg/dL (ref 0–99)
Triglycerides: 99 mg/dL (ref 0–149)
VLDL Cholesterol Cal: 19 mg/dL (ref 5–40)

## 2020-02-21 DIAGNOSIS — D259 Leiomyoma of uterus, unspecified: Secondary | ICD-10-CM | POA: Diagnosis not present

## 2020-02-21 DIAGNOSIS — N939 Abnormal uterine and vaginal bleeding, unspecified: Secondary | ICD-10-CM | POA: Diagnosis not present

## 2020-02-21 DIAGNOSIS — N888 Other specified noninflammatory disorders of cervix uteri: Secondary | ICD-10-CM | POA: Diagnosis not present

## 2020-02-21 DIAGNOSIS — D250 Submucous leiomyoma of uterus: Secondary | ICD-10-CM | POA: Diagnosis not present

## 2020-02-21 DIAGNOSIS — D251 Intramural leiomyoma of uterus: Secondary | ICD-10-CM | POA: Diagnosis not present

## 2020-02-21 DIAGNOSIS — N393 Stress incontinence (female) (male): Secondary | ICD-10-CM | POA: Diagnosis not present

## 2020-02-21 HISTORY — PX: LAPAROSCOPIC HYSTERECTOMY: SHX1926

## 2020-02-25 ENCOUNTER — Encounter: Payer: Self-pay | Admitting: Internal Medicine

## 2020-03-12 ENCOUNTER — Other Ambulatory Visit: Payer: Self-pay

## 2020-03-12 ENCOUNTER — Other Ambulatory Visit: Payer: Self-pay | Admitting: Obstetrics

## 2020-03-12 DIAGNOSIS — N946 Dysmenorrhea, unspecified: Secondary | ICD-10-CM

## 2020-03-12 MED ORDER — IBUPROFEN 800 MG PO TABS
800.0000 mg | ORAL_TABLET | Freq: Four times a day (QID) | ORAL | 0 refills | Status: DC | PRN
Start: 2020-03-12 — End: 2021-04-18

## 2020-03-20 ENCOUNTER — Other Ambulatory Visit: Payer: Self-pay | Admitting: Internal Medicine

## 2020-03-21 ENCOUNTER — Other Ambulatory Visit: Payer: Self-pay

## 2020-03-21 ENCOUNTER — Ambulatory Visit: Payer: Medicaid Other | Admitting: Internal Medicine

## 2020-03-21 ENCOUNTER — Encounter: Payer: Self-pay | Admitting: Internal Medicine

## 2020-03-21 VITALS — BP 140/72 | HR 102 | Temp 98.1°F | Ht 63.2 in | Wt 188.0 lb

## 2020-03-21 DIAGNOSIS — E6609 Other obesity due to excess calories: Secondary | ICD-10-CM

## 2020-03-21 DIAGNOSIS — I10 Essential (primary) hypertension: Secondary | ICD-10-CM

## 2020-03-21 DIAGNOSIS — Z6833 Body mass index (BMI) 33.0-33.9, adult: Secondary | ICD-10-CM | POA: Diagnosis not present

## 2020-03-21 NOTE — Patient Instructions (Signed)

## 2020-03-21 NOTE — Progress Notes (Signed)
Durenda Hurt Wiggins,acting as a scribe for [name], MD.,have documented all relevant documentation on the behalf of [name], MD,as directed by  [name], MD while in the presence of [name], MD.  This visit occurred during the SARS-CoV-2 public health emergency.  Safety protocols were in place, including screening questions prior to the visit, additional usage of staff PPE, and extensive cleaning of exam room while observing appropriate contact time as indicated for disinfecting solutions.  Subjective:     Patient ID: Lisa Nash , female    DOB: July 16, 1970 , 49 y.o.   MRN: [id number]   Chief Complaint  Patient presents with   Hypertension    HPI  She is here today for blood pressure check. She admits that she sometimes misses her dose and she took meds just prior to appt today. She recently underwent a robot-assisted total laparoscopic hysterectomy, bilateral salpingectomy, and mid-urethral sling with cystoscopy ([facility]) on 02/21/2020 by Dr. Zigmund Daniel [facility]) for management of abnormal uterine bleeding, uterine fibroids, and stress urinary incontinence. She denies having any post-operative complications and is doing well.   Hypertension This is a chronic problem. The current episode started more than 1 year ago. The problem has been gradually improving since onset. The problem is controlled. Pertinent negatives include no blurred vision, chest pain, palpitations or shortness of breath. The current treatment provides moderate improvement. Compliance problems include exercise.      Past Medical History:  Diagnosis Date   Arthritis    Asthma    Eczema      Family History  Problem Relation Age of Onset   Hypertension Mother    Hypertension Father    Arthritis Father      Current Outpatient Medications:    albuterol (PROVENTIL) (2.5 MG/3ML) 0.083% nebulizer solution, U 3 ML VIA NEB TID, Disp: , Rfl: 2   beclomethasone (QVAR) 80  MCG/ACT inhaler, Inhale 2 puffs into the lungs 2 (two) times daily., Disp: 1 Inhaler, Rfl: 5   cetirizine (ZYRTEC) 10 MG tablet, Take 1 tablet (10 mg total) by mouth daily., Disp: 90 tablet, Rfl: 2   halobetasol (ULTRAVATE) 0.05 % ointment, APPLY EXTERNALLY TO THE AFFECTED AREA TWICE DAILY AS NEEDED FOR ALLERGIC REACTION OR RASH, Disp: 50 g, Rfl: 0   hydrochlorothiazide (MICROZIDE) 12.5 MG capsule, Take 1 capsule (12.5 mg total) by mouth daily., Disp: 90 capsule, Rfl: 1   ibuprofen (ADVIL) 800 MG tablet, Take 1 tablet (800 mg total) by mouth every 6 (six) hours as needed., Disp: 30 tablet, Rfl: 0   MOBIC 15 MG tablet, Take 15 mg by mouth daily., Disp: , Rfl:    montelukast (SINGULAIR) 10 MG tablet, TAKE 1 TABLET BY MOUTH EVERY DAY IN THE EVENING, Disp: 90 tablet, Rfl: 3   Vitamin D, Ergocalciferol, (DRISDOL) 1.25 MG (50000 UNIT) CAPS capsule, TAKE 1 CAPSULE BY MOUTH 2 TIMES A WEEK, Disp: 24 capsule, Rfl: 1   megestrol (MEGACE) 40 MG tablet, Take by mouth. (Patient not taking: Reported on 03/21/2020), Disp: , Rfl:    Allergies  Allergen Reactions   Adhesive [Tape]    Pollen Extract      Review of Systems  Constitutional: Negative.   Eyes: Negative for blurred vision.  Respiratory: Negative.  Negative for shortness of breath.   Cardiovascular: Negative.  Negative for chest pain and palpitations.  Gastrointestinal: Negative.   Neurological: Negative.   Psychiatric/Behavioral: Negative.      Today's Vitals   03/21/20 1411  BP: 140/72  Pulse: (!) 102  Temp: 98.1 F (36.7 C)  TempSrc: Oral  Weight: 188 lb (85.3 kg)  Height: 5' 3.2" (1.605 m)   Body mass index is 33.09 kg/m.  Wt Readings from Last 3 Encounters:  03/21/20 188 lb (85.3 kg)  02/15/20 186 lb 6.4 oz (84.6 kg)  01/03/20 180 lb (81.6 kg)   Objective:  Physical Exam Vitals and nursing note reviewed.  Constitutional:      Appearance: Normal appearance. She is obese.  HENT:     Head: Normocephalic and  atraumatic.  Cardiovascular:     Rate and Rhythm: Normal rate and regular rhythm.     Heart sounds: Normal heart sounds.  Pulmonary:     Effort: Pulmonary effort is normal.     Breath sounds: Normal breath sounds.  Skin:    General: Skin is warm.  Neurological:     General: No focal deficit present.     Mental Status: She is alert.  Psychiatric:        Mood and Affect: Mood normal.        Behavior: Behavior normal.         Assessment And Plan:     1. Essential hypertension, benign Comments: I would like to add ARB to her current regimen. She refuses, states she does not want to take any add'l meds. Risks associated w/ uncontrolled BP and smoking were discussed in full detail. She will rto as scheduled in April 2022.   2. Class 1 obesity due to excess calories with serious comorbidity and body mass index (BMI) of 33.0 to 33.9 in adult  She is encouraged to strive for BMI less than 30 to decrease cardiac risk. Advised to aim for at least 150 minutes of exercise per week. She is reminded that her weight loss efforts will also benefit her blood pressure.    Patient was given opportunity to ask questions. Patient verbalized understanding of the plan and was able to repeat key elements of the plan. All questions were answered to their satisfaction.  [name], MD   I, [name], MD, have reviewed all documentation for this visit. The documentation on 03/21/20 for the exam, diagnosis, procedures, and orders are all accurate and complete.  THE PATIENT IS ENCOURAGED TO PRACTICE SOCIAL DISTANCING DUE TO THE COVID-19 PANDEMIC.

## 2020-03-28 DIAGNOSIS — R809 Proteinuria, unspecified: Secondary | ICD-10-CM | POA: Diagnosis not present

## 2020-05-25 DIAGNOSIS — F321 Major depressive disorder, single episode, moderate: Secondary | ICD-10-CM | POA: Diagnosis not present

## 2020-05-30 DIAGNOSIS — F4312 Post-traumatic stress disorder, chronic: Secondary | ICD-10-CM | POA: Diagnosis not present

## 2020-06-06 DIAGNOSIS — F4312 Post-traumatic stress disorder, chronic: Secondary | ICD-10-CM | POA: Diagnosis not present

## 2020-06-19 DIAGNOSIS — F4312 Post-traumatic stress disorder, chronic: Secondary | ICD-10-CM | POA: Diagnosis not present

## 2020-06-27 DIAGNOSIS — F4312 Post-traumatic stress disorder, chronic: Secondary | ICD-10-CM | POA: Diagnosis not present

## 2020-07-04 DIAGNOSIS — F4312 Post-traumatic stress disorder, chronic: Secondary | ICD-10-CM | POA: Diagnosis not present

## 2020-07-09 ENCOUNTER — Other Ambulatory Visit: Payer: Self-pay | Admitting: Internal Medicine

## 2020-07-11 DIAGNOSIS — F4312 Post-traumatic stress disorder, chronic: Secondary | ICD-10-CM | POA: Diagnosis not present

## 2020-07-18 DIAGNOSIS — F4312 Post-traumatic stress disorder, chronic: Secondary | ICD-10-CM | POA: Diagnosis not present

## 2020-07-23 ENCOUNTER — Other Ambulatory Visit: Payer: Self-pay | Admitting: Internal Medicine

## 2020-07-25 DIAGNOSIS — F4312 Post-traumatic stress disorder, chronic: Secondary | ICD-10-CM | POA: Diagnosis not present

## 2020-07-31 ENCOUNTER — Other Ambulatory Visit: Payer: Self-pay | Admitting: Internal Medicine

## 2020-07-31 DIAGNOSIS — Z1231 Encounter for screening mammogram for malignant neoplasm of breast: Secondary | ICD-10-CM

## 2020-08-01 ENCOUNTER — Ambulatory Visit
Admission: RE | Admit: 2020-08-01 | Discharge: 2020-08-01 | Disposition: A | Discharge status: Home / Self Care | Payer: Medicaid Other | Source: Ambulatory Visit | Attending: Internal Medicine | Admitting: Internal Medicine

## 2020-08-01 ENCOUNTER — Other Ambulatory Visit: Payer: Self-pay

## 2020-08-01 DIAGNOSIS — Z1231 Encounter for screening mammogram for malignant neoplasm of breast: Secondary | ICD-10-CM | POA: Diagnosis not present

## 2020-08-01 DIAGNOSIS — F4312 Post-traumatic stress disorder, chronic: Secondary | ICD-10-CM | POA: Diagnosis not present

## 2020-08-08 DIAGNOSIS — F4312 Post-traumatic stress disorder, chronic: Secondary | ICD-10-CM | POA: Diagnosis not present

## 2020-08-14 ENCOUNTER — Telehealth: Payer: Self-pay

## 2020-08-14 NOTE — Telephone Encounter (Signed)
Left pt a voicemail to get back to [place] at her earliest convenience, to see if she is taking her medication as directed.

## 2020-08-15 ENCOUNTER — Encounter: Payer: Self-pay | Admitting: Internal Medicine

## 2020-08-15 ENCOUNTER — Ambulatory Visit: Payer: Medicaid Other | Admitting: Internal Medicine

## 2020-08-15 ENCOUNTER — Other Ambulatory Visit: Payer: Self-pay

## 2020-08-15 VITALS — BP 128/86 | HR 95 | Temp 98.4°F | Ht 63.2 in | Wt 191.4 lb

## 2020-08-15 DIAGNOSIS — N898 Other specified noninflammatory disorders of vagina: Secondary | ICD-10-CM | POA: Diagnosis not present

## 2020-08-15 DIAGNOSIS — J301 Allergic rhinitis due to pollen: Secondary | ICD-10-CM | POA: Diagnosis not present

## 2020-08-15 DIAGNOSIS — E6609 Other obesity due to excess calories: Secondary | ICD-10-CM

## 2020-08-15 DIAGNOSIS — Z6833 Body mass index (BMI) 33.0-33.9, adult: Secondary | ICD-10-CM | POA: Diagnosis not present

## 2020-08-15 DIAGNOSIS — I10 Essential (primary) hypertension: Secondary | ICD-10-CM | POA: Diagnosis not present

## 2020-08-15 DIAGNOSIS — Z111 Encounter for screening for respiratory tuberculosis: Secondary | ICD-10-CM

## 2020-08-15 NOTE — Patient Instructions (Signed)

## 2020-08-15 NOTE — Progress Notes (Signed)
I,[name],acting as a Education administrator for [name], MD.,have documented all relevant documentation on the behalf of [name], MD,as directed by  [name], MD while in the presence of [name], MD.  This visit occurred during the SARS-CoV-2 public health emergency.  Safety protocols were in place, including screening questions prior to the visit, additional usage of staff PPE, and extensive cleaning of exam room while observing appropriate contact time as indicated for disinfecting solutions.  Subjective:     Patient ID: Lisa Nash , female    DOB: 1970-10-12 , 50 y.o.   MRN: [id number]   Chief Complaint  Patient presents with  . Hypertension  . tb test    HPI  She is here today for blood pressure check. She reports compliance with meds. She denies headaches, chest pain and shortness of breath. She would also like to get TB test for work.  Hypertension This is a chronic problem. The current episode started more than 1 year ago. The problem has been gradually improving since onset. The problem is controlled. Pertinent negatives include no blurred vision, chest pain, palpitations or shortness of breath. The current treatment provides moderate improvement. Compliance problems include exercise.      Past Medical History:  Diagnosis Date  . Arthritis   . Asthma   . Eczema      Family History  Problem Relation Age of Onset  . Hypertension Mother   . Hypertension Father   . Arthritis Father      Current Outpatient Medications:  .  albuterol (PROVENTIL) (2.5 MG/3ML) 0.083% nebulizer solution, U 3 ML VIA NEB TID, Disp: , Rfl: 2 .  beclomethasone (QVAR) 80 MCG/ACT inhaler, Inhale 2 puffs into the lungs 2 (two) times daily., Disp: 1 Inhaler, Rfl: 5 .  cetirizine (ZYRTEC) 10 MG tablet, Take 1 tablet (10 mg total) by mouth daily., Disp: 90 tablet, Rfl: 2 .  halobetasol (ULTRAVATE) 0.05 % ointment, APPLY EXTERNALLY TO THE AFFECTED AREA TWICE DAILY AS  NEEDED FOR ALLERGIC REACTION OR RASH, Disp: 50 g, Rfl: 0 .  hydrochlorothiazide (MICROZIDE) 12.5 MG capsule, TAKE 1 CAPSULE(12.5 MG) BY MOUTH DAILY, Disp: 90 capsule, Rfl: 1 .  ibuprofen (ADVIL) 800 MG tablet, Take 1 tablet (800 mg total) by mouth every 6 (six) hours as needed., Disp: 30 tablet, Rfl: 0 .  MOBIC 15 MG tablet, Take 15 mg by mouth daily., Disp: , Rfl:  .  montelukast (SINGULAIR) 10 MG tablet, TAKE 1 TABLET BY MOUTH EVERY DAY IN THE EVENING, Disp: 90 tablet, Rfl: 3 .  Vitamin D, Ergocalciferol, (DRISDOL) 1.25 MG (50000 UNIT) CAPS capsule, TAKE 1 CAPSULE BY MOUTH 2 TIMES A WEEK, Disp: 24 capsule, Rfl: 1 .  megestrol (MEGACE) 40 MG tablet, Take by mouth. (Patient not taking: Reported on 08/15/2020), Disp: , Rfl:    Allergies  Allergen Reactions  . Adhesive [Tape]   . Pollen Extract      Review of Systems  Constitutional: Negative.   HENT: Positive for postnasal drip and rhinorrhea.   Eyes: Negative for blurred vision.  Respiratory: Negative.  Negative for shortness of breath.   Cardiovascular: Negative.  Negative for chest pain and palpitations.  Gastrointestinal: Negative.   Psychiatric/Behavioral: Negative.   All other systems reviewed and are negative.    Today's Vitals   08/15/20 1155  BP: 128/86  Pulse: 95  Temp: 98.4 F (36.9 C)  TempSrc: Oral  Weight: 191 lb 6.4 oz (86.8 kg)  Height: 5'  3.2" (1.605 m)   Body mass index is 33.69 kg/m.  Wt Readings from Last 3 Encounters:  08/15/20 191 lb 6.4 oz (86.8 kg)  03/21/20 188 lb (85.3 kg)  02/15/20 186 lb 6.4 oz (84.6 kg)   Objective:  Physical Exam Vitals and nursing note reviewed.  Constitutional:      Appearance: Normal appearance.  HENT:     Head: Normocephalic and atraumatic.     Nose:     Comments: Masked     Mouth/Throat:     Comments: Masked  Cardiovascular:     Rate and Rhythm: Normal rate and regular rhythm.     Heart sounds: Normal heart sounds.  Pulmonary:     Effort: Pulmonary effort is  normal.     Breath sounds: Normal breath sounds.  Musculoskeletal:     Cervical back: Normal range of motion.  Skin:    General: Skin is warm.  Neurological:     General: No focal deficit present.     Mental Status: She is alert.  Psychiatric:        Mood and Affect: Mood normal.        Behavior: Behavior normal.         Assessment And Plan:     1. Essential hypertension, benign Comments: Chronic, controlled. She will c/w current meds, encouraged to avoid adding salt to her foods. I will check renal function today.  - CMP14+EGFR  2. Screening-pulmonary TB Comments: I will check TB-quantiferon today as requested.  - QuantiFERON-TB Gold Plus  3. Seasonal allergic rhinitis due to pollen Comments: She will c/w montelukast and cetirizine.   4. Class 1 obesity due to excess calories with serious comorbidity and body mass index (BMI) of 33.0 to 33.9 in adult  She is encouraged to strive for BMI less than 30 to decrease cardiac risk. Advised to aim for at least 150 minutes of exercise per week.   Patient was given opportunity to ask questions. Patient verbalized understanding of the plan and was able to repeat key elements of the plan. All questions were answered to their satisfaction.    I, [name], MD, have reviewed all documentation for this visit. The documentation on 08/15/20 for the exam, diagnosis, procedures, and orders are all accurate and complete.   IF YOU HAVE BEEN REFERRED TO A SPECIALIST, IT MAY TAKE 1-2 WEEKS TO SCHEDULE/PROCESS THE REFERRAL. IF YOU HAVE NOT HEARD FROM US/SPECIALIST IN TWO WEEKS, PLEASE GIVE [place] A CALL AT [phone].   THE PATIENT IS ENCOURAGED TO PRACTICE SOCIAL DISTANCING DUE TO THE COVID-19 PANDEMIC.

## 2020-08-16 LAB — CMP14+EGFR
ALT: 24 IU/L (ref 0–32)
AST: 22 IU/L (ref 0–40)
Albumin/Globulin Ratio: 1.5 (ref 1.2–2.2)
Albumin: 4.6 g/dL (ref 3.8–4.8)
Alkaline Phosphatase: 78 IU/L (ref 44–121)
BUN/Creatinine Ratio: 14 (ref 9–23)
BUN: 12 mg/dL (ref 6–24)
Bilirubin Total: 0.3 mg/dL (ref 0.0–1.2)
CO2: 20 mmol/L (ref 20–29)
Calcium: 9.9 mg/dL (ref 8.7–10.2)
Chloride: 101 mmol/L (ref 96–106)
Creatinine, Ser: 0.83 mg/dL (ref 0.57–1.00)
Globulin, Total: 3 g/dL (ref 1.5–4.5)
Glucose: 110 mg/dL — ABNORMAL HIGH (ref 65–99)
Potassium: 3.9 mmol/L (ref 3.5–5.2)
Sodium: 138 mmol/L (ref 134–144)
Total Protein: 7.6 g/dL (ref 6.0–8.5)
eGFR: 86 mL/min/{1.73_m2} (ref >59)

## 2020-08-18 LAB — QUANTIFERON-TB GOLD PLUS
QuantiFERON Mitogen Value: >10 IU/mL
QuantiFERON Nil Value: 0.06 IU/mL
QuantiFERON TB1 Ag Value: 0.08 IU/mL
QuantiFERON TB2 Ag Value: 0.09 IU/mL
QuantiFERON-TB Gold Plus: NEGATIVE

## 2020-08-22 DIAGNOSIS — F4312 Post-traumatic stress disorder, chronic: Secondary | ICD-10-CM | POA: Diagnosis not present

## 2020-08-29 DIAGNOSIS — F4312 Post-traumatic stress disorder, chronic: Secondary | ICD-10-CM | POA: Diagnosis not present

## 2020-09-05 DIAGNOSIS — F4312 Post-traumatic stress disorder, chronic: Secondary | ICD-10-CM | POA: Diagnosis not present

## 2020-09-12 DIAGNOSIS — F4312 Post-traumatic stress disorder, chronic: Secondary | ICD-10-CM | POA: Diagnosis not present

## 2020-10-04 ENCOUNTER — Other Ambulatory Visit: Payer: Self-pay | Admitting: Internal Medicine

## 2020-10-04 DIAGNOSIS — E559 Vitamin D deficiency, unspecified: Secondary | ICD-10-CM

## 2020-10-21 ENCOUNTER — Ambulatory Visit (HOSPITAL_COMMUNITY)
Admission: EM | Admit: 2020-10-21 | Discharge: 2020-10-21 | Disposition: A | Discharge status: Home / Self Care | Payer: Medicaid Other

## 2020-10-21 ENCOUNTER — Other Ambulatory Visit: Payer: Self-pay

## 2020-10-21 ENCOUNTER — Encounter (HOSPITAL_COMMUNITY): Payer: Self-pay

## 2020-10-21 DIAGNOSIS — R21 Rash and other nonspecific skin eruption: Secondary | ICD-10-CM

## 2020-10-21 DIAGNOSIS — T7840XA Allergy, unspecified, initial encounter: Secondary | ICD-10-CM | POA: Diagnosis not present

## 2020-10-21 DIAGNOSIS — L509 Urticaria, unspecified: Secondary | ICD-10-CM | POA: Diagnosis not present

## 2020-10-21 MED ORDER — EPINEPHRINE 0.3 MG/0.3ML IJ SOAJ: 0.3000 mg | INTRAMUSCULAR | 0 refills | Status: AC | PRN

## 2020-10-21 MED ORDER — METHYLPREDNISOLONE ACETATE 80 MG/ML IJ SUSP
INTRAMUSCULAR | Status: AC
  Filled 2020-10-21: qty 1

## 2020-10-21 MED ORDER — METHYLPREDNISOLONE ACETATE 80 MG/ML IJ SUSP
60.0000 mg | Freq: Once | INTRAMUSCULAR | Status: AC
  Administered 2020-10-21: 60 mg via INTRAMUSCULAR

## 2020-10-21 MED ORDER — DIPHENHYDRAMINE HCL 25 MG PO CAPS
ORAL_CAPSULE | ORAL | Status: AC
  Filled 2020-10-21: qty 2

## 2020-10-21 MED ORDER — PREDNISONE 10 MG (21) PO TBPK: ORAL_TABLET | ORAL | 0 refills | Status: DC

## 2020-10-21 MED ORDER — FAMOTIDINE 40 MG PO TABS: 40.0000 mg | ORAL_TABLET | Freq: Every day | ORAL | 0 refills | Status: DC

## 2020-10-21 MED ORDER — DIPHENHYDRAMINE HCL 25 MG PO CAPS
50.0000 mg | ORAL_CAPSULE | Freq: Once | ORAL | Status: AC
  Administered 2020-10-21: 50 mg via ORAL

## 2020-10-21 NOTE — ED Notes (Signed)
Pt was told to wait in the lobby and let the front desk staff know if any swollen lips, difficulty breathing, swallowing, throat closing/tightness.   Pt was todl she needs to wait in the lobby not ion her car, as we can not keep checking on her if she is in her car.

## 2020-10-21 NOTE — ED Triage Notes (Signed)
Pt reports she woke up this morning at 10 am with a  rash and itchy in the face. Denies trouble breathing, swallowing.

## 2020-10-21 NOTE — Discharge Instructions (Signed)
You are given a steroid injection and Benadryl today.  Please continue your antihistamines as prescribed.  Add famotidine (Pepcid) at night.  Start prednisone tomorrow.  While you are taking prednisone no ibuprofen, aspirin, Aleve as it can cause stomach bleeding.  You can take Tylenol.  I have called in an EpiPen.  You only need to use this if you develop any shortness of breath or swelling of your throat.  If you do need to use this you need to go to the emergency room.  If you have any worsening symptoms including widespread rash, shortness of breath, throat/tongue swelling, trouble swallowing you need to go to the ER.

## 2020-10-21 NOTE — ED Provider Notes (Signed)
[facility]    CSN: [id number] Arrival date & time: 10/21/20  1326      History   Chief Complaint Chief Complaint  Patient presents with   Allergic Reaction   Rash    HPI Lisa Nash is a 50 y.o. female.   Patient presents today with a several hour history of pruritic rash.  She reports waking up this morning with pruritic rash involving her face and neck.  She does have a history of sensitive skin and often has hives and urticaria when exposed to new things.  She denies any new exposures including new medications, new cosmetics, new personal hygiene products including soaps or detergents.  She denies eating any new foods.  She denies any recent outdoor activities where she would have been exposed to insects or poison ivy.  She has seen an allergist in the past and had allergy testing.  She is currently taking Allegra and Zyrtec and took this earlier today.  She did not take any medication when symptoms began but was given 50 mg of Benadryl in clinic with improvement of symptoms.  She denies any difficulty breathing, swallowing, voice changes.  She denies history of anaphylaxis and does not have an EpiPen.   Past Medical History:  Diagnosis Date   Arthritis    Asthma    Eczema     Patient Active Problem List   Diagnosis Date Noted   Grief at loss of child 11/16/2019   Adjustment disorder with depressed mood 11/16/2019   Hair loss 11/16/2019   Essential hypertension, benign 11/16/2019   Bruising 03/25/2014   Eczema     Past Surgical History:  Procedure Laterality Date   HAND SURGERY Left 11/02/2018   LAPAROSCOPIC HYSTERECTOMY  02/21/2020    Robot-assisted total laparoscopic hysterectomy, bilateral salpingectomy, mid-urethral sling with cystoscopy ([facility]) on 02/21/2020 by Dr. Zigmund Daniel     OB History     Gravida  4   Para      Term      Preterm      AB  2   Living  2      SAB  2   IAB      Ectopic      Multiple      Live Births   2            Home Medications    Prior to Admission medications   Medication Sig Start Date End Date Taking? Authorizing Provider  EPINEPHrine 0.3 mg/0.3 mL IJ SOAJ injection Inject 0.3 mg into the muscle as needed for anaphylaxis. 10/21/20  Yes Khari Mally K, PA-C  estradiol (VIVELLE-DOT) 0.025 MG/24HR Place onto the skin. 04/12/20  Yes [provider]  famotidine (PEPCID) 40 MG tablet Take 1 tablet (40 mg total) by mouth at bedtime. 10/21/20  Yes Jamaia Brum K, PA-C  fexofenadine (ALLEGRA) 60 MG tablet Take 60 mg by mouth 2 (two) times daily.   Yes [provider]  predniSONE (STERAPRED UNI-PAK 21 TAB) 10 MG (21) TBPK tablet As directed 10/21/20  Yes Abdurrahman Petersheim K, PA-C  sertraline (ZOLOFT) 25 MG tablet Take by mouth. 08/16/19  Yes [provider]  albuterol (PROVENTIL) (2.5 MG/3ML) 0.083% nebulizer solution U 3 ML VIA NEB TID 08/24/17   [provider]  beclomethasone (QVAR) 80 MCG/ACT inhaler Inhale 2 puffs into the lungs 2 (two) times daily. 11/02/19   Glendale Chard, MD  cetirizine (ZYRTEC) 10 MG tablet Take 1 tablet (10 mg total) by mouth  daily. 02/15/20   Glendale Chard, MD  halobetasol (ULTRAVATE) 0.05 % ointment APPLY EXTERNALLY TO THE AFFECTED AREA TWICE DAILY AS NEEDED FOR ALLERGIC REACTION OR RASH 07/10/20   Glendale Chard, MD  hydrochlorothiazide (MICROZIDE) 12.5 MG capsule TAKE 1 CAPSULE(12.5 MG) BY MOUTH DAILY 07/23/20   Glendale Chard, MD  ibuprofen (ADVIL) 800 MG tablet Take 1 tablet (800 mg total) by mouth every 6 (six) hours as needed. 03/12/20   Glendale Chard, MD  megestrol (MEGACE) 40 MG tablet Take by mouth. 12/21/19   [provider]  MOBIC 15 MG tablet Take 15 mg by mouth daily. 08/27/19   [provider]  montelukast (SINGULAIR) 10 MG tablet TAKE 1 TABLET BY MOUTH EVERY DAY IN THE EVENING 02/15/20   Glendale Chard, MD  Vitamin D, Ergocalciferol, (DRISDOL) 1.25 MG (50000 UNIT) CAPS capsule TAKE 1 CAPSULE BY MOUTH 2  TIMES A WEEK 10/04/20   Glendale Chard, MD    Family History Family History  Problem Relation Age of Onset   Hypertension Mother    Hypertension Father    Arthritis Father     Social History Social History   Tobacco Use   Smoking status: Some Days    Packs/day: 0.50    Years: 6.00    Pack years: 3.00    Types: Cigarettes    Last attempt to quit: 07/09/2005    Years since quitting: 15.2   Smokeless tobacco: Never   Tobacco comments:    decrease number of cigs smoked daily  Vaping Use   Vaping Use: Never used  Substance Use Topics   Alcohol use: Yes    Alcohol/week: 3.0 - 4.0 standard drinks    Types: 1 Glasses of wine, 2 - 3 Cans of beer per week    Comment: drinks beer 2-3 times per week, wine occassionally   Drug use: No     Allergies   Adhesive [tape] and Pollen extract   Review of Systems Review of Systems  Constitutional:  Negative for activity change, appetite change, fatigue and fever.  HENT:  Negative for trouble swallowing and voice change.   Respiratory:  Negative for cough, chest tightness, shortness of breath and wheezing.   Cardiovascular:  Negative for chest pain.  Gastrointestinal:  Negative for abdominal pain, diarrhea, nausea and vomiting.  Skin:  Positive for rash.  Neurological:  Negative for dizziness, light-headedness and headaches.    Physical Exam Triage Vital Signs ED Triage Vitals  Enc Vitals Group     BP 10/21/20 1331 (!) 149/93     Pulse Rate 10/21/20 1331 95     Resp 10/21/20 1331 18     Temp 10/21/20 1331 98.5 F (36.9 C)     Temp Source 10/21/20 1331 Oral     SpO2 10/21/20 1331 100 %     Weight --      Height --      Head Circumference --      Peak Flow --      Pain Score 10/21/20 1333 0     Pain Loc --      Pain Edu? --      Excl. in [place]? --    No data found.  Updated Vital Signs BP (!) 149/93 (BP Location: Right Arm)   Pulse 95   Temp 98.5 F (36.9 C) (Oral)   Resp 18   LMP  (LMP Unknown) Comment: partial  hysterectomy  SpO2 100%   Visual Acuity Right Eye Distance:   Left Eye Distance:  Bilateral Distance:    Right Eye Near:   Left Eye Near:    Bilateral Near:     Physical Exam Vitals reviewed.  Constitutional:      General: She is awake. She is not in acute distress.    Appearance: Normal appearance. She is normal weight. She is not ill-appearing.     Comments: Very pleasant female appears stated age no acute distress sitting comfortably in exam room  HENT:     Head: Normocephalic and atraumatic.     Mouth/Throat:     Mouth: No angioedema.     Pharynx: Uvula midline. No oropharyngeal exudate or posterior oropharyngeal erythema.     Comments: Normal posterior oropharynx. Cardiovascular:     Rate and Rhythm: Normal rate and regular rhythm.     Heart sounds: Normal heart sounds, S1 normal and S2 normal. No murmur heard. Pulmonary:     Effort: Pulmonary effort is normal.     Breath sounds: Normal breath sounds. No stridor. No wheezing, rhonchi or rales.     Comments: Clear to auscultation bilaterally Abdominal:     Palpations: Abdomen is soft.     Tenderness: There is no abdominal tenderness.  Skin:    Findings: Rash present. Rash is urticarial.     Comments: Urticarial rash noted on face and neck.  No streaking or evidence of lymphangitis.  No lesions noted.  Psychiatric:        Behavior: Behavior is cooperative.     UC Treatments / Results  Labs (all labs ordered are listed, but only abnormal results are displayed) Labs Reviewed - No data to display  EKG   Radiology No results found.  Procedures Procedures (including critical care time)  Medications Ordered in UC Medications  methylPREDNISolone acetate (DEPO-MEDROL) injection 60 mg (has no administration in time range)  diphenhydrAMINE (BENADRYL) capsule 50 mg (50 mg Oral Given 10/21/20 1341)    Initial Impression / Assessment and Plan / UC Course  I have reviewed the triage vital signs and the nursing  notes.  Pertinent labs & imaging results that were available during my care of the patient were reviewed by me and considered in my medical decision making (see chart for details).      Vital signs and physical exam reassuring today; no indication for emergent evaluation or imaging.  Patient had improvement with Benadryl in clinic.  She was instructed to continue H1 antihistamines as prescribed by allergist and will add Pepcid for H2.  She was given a Depo-Medrol in clinic and instructed to begin prednisone tomorrow.  She was instructed not to take NSAIDs with prednisone due to risk of GI bleeding.  No involvement of tongue or throat at this time patient was provided a prescription for EpiPen we discussed when and how to use this medication.  If she has any severe reaction and EpiPen is required she will need to go to the emergency room.  Encouraged her to use hypoallergenic soaps and detergents.  Discussed alarm symptoms that warrant emergent evaluation.  Strict return precautions given to which patient expressed understanding.  Final Clinical Impressions(s) / UC Diagnoses   Final diagnoses:  Allergic reaction, initial encounter  Urticaria  Rash     Discharge Instructions      You are given a steroid injection and Benadryl today.  Please continue your antihistamines as prescribed.  Add famotidine (Pepcid) at night.  Start prednisone tomorrow.  While you are taking prednisone no ibuprofen, aspirin, Aleve as it can cause stomach  bleeding.  You can take Tylenol.  I have called in an EpiPen.  You only need to use this if you develop any shortness of breath or swelling of your throat.  If you do need to use this you need to go to the emergency room.  If you have any worsening symptoms including widespread rash, shortness of breath, throat/tongue swelling, trouble swallowing you need to go to the ER.     ED Prescriptions     Medication Sig Dispense Auth. Provider   EPINEPHrine 0.3 mg/0.3 mL  IJ SOAJ injection Inject 0.3 mg into the muscle as needed for anaphylaxis. 1 each Zaia Carre K, PA-C   predniSONE (STERAPRED UNI-PAK 21 TAB) 10 MG (21) TBPK tablet As directed 21 tablet Lee Kalt K, PA-C   famotidine (PEPCID) 40 MG tablet Take 1 tablet (40 mg total) by mouth at bedtime. 10 tablet Lavayah Vita, Derry Skill, PA-C      PDMP not reviewed this encounter.   Terrilee Croak, PA-C 10/21/20 1611

## 2020-10-24 ENCOUNTER — Telehealth: Payer: Medicaid Other | Admitting: Physician Assistant

## 2020-10-24 DIAGNOSIS — T7840XD Allergy, unspecified, subsequent encounter: Secondary | ICD-10-CM | POA: Diagnosis not present

## 2020-10-24 MED ORDER — HYDROXYZINE PAMOATE 25 MG PO CAPS: 25.0000 mg | ORAL_CAPSULE | Freq: Three times a day (TID) | ORAL | 0 refills | Status: DC | PRN

## 2020-10-24 NOTE — Patient Instructions (Signed)
Lisa Nash, thank you for joining [name], PA-C for today's virtual visit.  While this provider is not your primary care provider (PCP), if your PCP is located in our provider database this encounter information will be shared with them immediately following your visit.  Consent: (Patient) [place] provided verbal consent for this virtual visit at the beginning of the encounter.  Current Medications:  Current Outpatient Medications:    hydrOXYzine (VISTARIL) 25 MG capsule, Take 1 capsule (25 mg total) by mouth every 8 (eight) hours as needed., Disp: 30 capsule, Rfl: 0   albuterol (PROVENTIL) (2.5 MG/3ML) 0.083% nebulizer solution, U 3 ML VIA NEB TID, Disp: , Rfl: 2   beclomethasone (QVAR) 80 MCG/ACT inhaler, Inhale 2 puffs into the lungs 2 (two) times daily., Disp: 1 Inhaler, Rfl: 5   cetirizine (ZYRTEC) 10 MG tablet, Take 1 tablet (10 mg total) by mouth daily., Disp: 90 tablet, Rfl: 2   EPINEPHrine 0.3 mg/0.3 mL IJ SOAJ injection, Inject 0.3 mg into the muscle as needed for anaphylaxis., Disp: 1 each, Rfl: 0   estradiol (VIVELLE-DOT) 0.025 MG/24HR, Place onto the skin., Disp: , Rfl:    famotidine (PEPCID) 40 MG tablet, Take 1 tablet (40 mg total) by mouth at bedtime., Disp: 10 tablet, Rfl: 0   fexofenadine (ALLEGRA) 60 MG tablet, Take 60 mg by mouth 2 (two) times daily., Disp: , Rfl:    halobetasol (ULTRAVATE) 0.05 % ointment, APPLY EXTERNALLY TO THE AFFECTED AREA TWICE DAILY AS NEEDED FOR ALLERGIC REACTION OR RASH, Disp: 50 g, Rfl: 0   hydrochlorothiazide (MICROZIDE) 12.5 MG capsule, TAKE 1 CAPSULE(12.5 MG) BY MOUTH DAILY, Disp: 90 capsule, Rfl: 1   ibuprofen (ADVIL) 800 MG tablet, Take 1 tablet (800 mg total) by mouth every 6 (six) hours as needed., Disp: 30 tablet, Rfl: 0   megestrol (MEGACE) 40 MG tablet, Take by mouth., Disp: , Rfl:    MOBIC 15 MG tablet, Take 15 mg by mouth daily., Disp: , Rfl:    montelukast (SINGULAIR) 10 MG tablet, TAKE 1 TABLET BY  MOUTH EVERY DAY IN THE EVENING, Disp: 90 tablet, Rfl: 3   predniSONE (STERAPRED UNI-PAK 21 TAB) 10 MG (21) TBPK tablet, As directed, Disp: 21 tablet, Rfl: 0   sertraline (ZOLOFT) 25 MG tablet, Take by mouth., Disp: , Rfl:    Vitamin D, Ergocalciferol, (DRISDOL) 1.25 MG (50000 UNIT) CAPS capsule, TAKE 1 CAPSULE BY MOUTH 2 TIMES A WEEK, Disp: 24 capsule, Rfl: 1   Medications ordered in this encounter:  Meds ordered this encounter  Medications   hydrOXYzine (VISTARIL) 25 MG capsule    Sig: Take 1 capsule (25 mg total) by mouth every 8 (eight) hours as needed.    Dispense:  30 capsule    Refill:  0    Order Specific Question:   Supervising Provider    Answer:   Sabra Heck, [place]     *If you need refills on other medications prior to your next appointment, please contact your pharmacy*  Follow-Up: Call back or seek an in-person evaluation if the symptoms worsen or if the condition fails to improve as anticipated.  Other Instructions Please complete entire course of your steroid as directed by the urgent care provider. Okay to continue the famotidine at night. I have sent in a prescription for hydroxyzine to take up to 3 times daily as needed for itch. Do not drive while taking this medication. Continue cold compresses to the face. You can consider over-the-counter hydrocortisone  to more problematic areas on face and neck, but do not use around the eyes, eyelids or the lips. Can also consider an over-the-counter hypoallergenic moisturizing lotion like Cetaphil or CeraVe  As discussed, you need to set up a follow-up with your primary care provider for referral to allergist.  If you note any new or worsening symptoms, especially if you notice any difficulty swallowing   If you have been instructed to have an in-person evaluation today at a local Urgent Care facility, please use the link below. It will take you to a list of all of our available [facility], including  address, phone number and hours of operation. Please do not delay care.  [facility]  If you or a family member do not have a primary care provider, use the link below to schedule a visit and establish care. When you choose a [facility] primary care physician or advanced practice provider, you gain a long-term partner in health. Find a Primary Care Provider  Learn more about [facility]'s in-office and virtual care options: [facility]

## 2020-10-24 NOTE — Progress Notes (Signed)
Virtual Visit Consent   [name], you are scheduled for a virtual visit with a Lisa [facility] provider today.     Just as with appointments in the office, your consent must be obtained to participate.  Your consent will be active for this visit and any virtual visit you may have with one of our providers in the next 365 days.     If you have a MyChart account, a copy of this consent can be sent to you electronically.  All virtual visits are billed to your insurance company just like a traditional visit in the office.    As this is a virtual visit, video technology does not allow for your provider to perform a traditional examination.  This may limit your provider's ability to fully assess your condition.  If your provider identifies any concerns that need to be evaluated in person or the need to arrange testing (such as labs, EKG, etc.), we will make arrangements to do so.     Although advances in technology are sophisticated, we cannot ensure that it will always work on either your end or our end.  If the connection with a video visit is poor, the visit may have to be switched to a telephone visit.  With either a video or telephone visit, we are not always able to ensure that we have a secure connection.     I need to obtain your verbal consent now.   Are you willing to proceed with your visit today?    Lisa Nash has provided verbal consent on 10/24/2020 for a virtual visit (video or telephone).   Lisa Nash, [place]   Date: 10/24/2020 1:59 PM  Virtual Visit via Video Note   I, Lisa Nash, connected with [place] ([id number], Nov 18, 1970) on 10/24/20 at  1:45 PM EDT by a video-enabled telemedicine application and verified that I am speaking with the correct person using two identifiers.  Location: Patient: Virtual Visit Location Patient: Home Provider: Virtual Visit Location Provider: Home Office   I discussed the limitations of evaluation and  management by telemedicine and the availability of in person appointments. The patient expressed understanding and agreed to proceed.    History of Present Illness: Lisa Nash is a 50 y.o. who identifies as a female who was assigned female at birth, and is being seen today for residual itching and dryness of skin after recent evaluation/treatment for suspected allergic reaction.  Patient woke up with itching, rash and swelling of her face and neck on 10/21/2020.  Noted some difficulty swallowing.  As such was evaluated at one of our urgent cares and diagnosed with allergic reaction.  Was given Benadryl in office with substantial improvement in symptoms and resolution of swallowing issues.  Was also given IM steroid.  Once provider monitored her and noted no worsening symptoms, she was felt stable for discharge home.  Was started on famotidine 40 mg nightly and a prednisone taper.  Patient endorses taking medications as directed and has noted substantial improvement in her symptoms.  Notes that symptoms are not completely resolved yet as she still having some itching of her face without true rash or any swelling.  Notes after starting steroids and famotidine, her skin seems to be slightly dry although she has been avoiding applying any lotions to the area.  Denies any new or worsening symptoms.  Denies fever, chills or aches.  States she is trying to avoid direct sunlight as this seems to  make her symptoms slightly worse.  She does not have any follow-up with her primary care provider.   HPI: HPI  Problems:  Patient Active Problem List   Diagnosis Date Noted   Grief at loss of child 11/16/2019   Adjustment disorder with depressed mood 11/16/2019   Hair loss 11/16/2019   Essential hypertension, benign 11/16/2019   Bruising 03/25/2014   Eczema     Allergies:  Allergies  Allergen Reactions   Adhesive [Tape]    Pollen Extract    Medications:  Current Outpatient Medications:     hydrOXYzine (VISTARIL) 25 MG capsule, Take 1 capsule (25 mg total) by mouth every 8 (eight) hours as needed., Disp: 30 capsule, Rfl: 0   albuterol (PROVENTIL) (2.5 MG/3ML) 0.083% nebulizer solution, U 3 ML VIA NEB TID, Disp: , Rfl: 2   beclomethasone (QVAR) 80 MCG/ACT inhaler, Inhale 2 puffs into the lungs 2 (two) times daily., Disp: 1 Inhaler, Rfl: 5   cetirizine (ZYRTEC) 10 MG tablet, Take 1 tablet (10 mg total) by mouth daily., Disp: 90 tablet, Rfl: 2   EPINEPHrine 0.3 mg/0.3 mL IJ SOAJ injection, Inject 0.3 mg into the muscle as needed for anaphylaxis., Disp: 1 each, Rfl: 0   estradiol (VIVELLE-DOT) 0.025 MG/24HR, Place onto the skin., Disp: , Rfl:    famotidine (PEPCID) 40 MG tablet, Take 1 tablet (40 mg total) by mouth at bedtime., Disp: 10 tablet, Rfl: 0   fexofenadine (ALLEGRA) 60 MG tablet, Take 60 mg by mouth 2 (two) times daily., Disp: , Rfl:    halobetasol (ULTRAVATE) 0.05 % ointment, APPLY EXTERNALLY TO THE AFFECTED AREA TWICE DAILY AS NEEDED FOR ALLERGIC REACTION OR RASH, Disp: 50 g, Rfl: 0   hydrochlorothiazide (MICROZIDE) 12.5 MG capsule, TAKE 1 CAPSULE(12.5 MG) BY MOUTH DAILY, Disp: 90 capsule, Rfl: 1   ibuprofen (ADVIL) 800 MG tablet, Take 1 tablet (800 mg total) by mouth every 6 (six) hours as needed., Disp: 30 tablet, Rfl: 0   megestrol (MEGACE) 40 MG tablet, Take by mouth., Disp: , Rfl:    MOBIC 15 MG tablet, Take 15 mg by mouth daily., Disp: , Rfl:    montelukast (SINGULAIR) 10 MG tablet, TAKE 1 TABLET BY MOUTH EVERY DAY IN THE EVENING, Disp: 90 tablet, Rfl: 3   predniSONE (STERAPRED UNI-PAK 21 TAB) 10 MG (21) TBPK tablet, As directed, Disp: 21 tablet, Rfl: 0   sertraline (ZOLOFT) 25 MG tablet, Take by mouth., Disp: , Rfl:    Vitamin D, Ergocalciferol, (DRISDOL) 1.25 MG (50000 UNIT) CAPS capsule, TAKE 1 CAPSULE BY MOUTH 2 TIMES A WEEK, Disp: 24 capsule, Rfl: 1  Observations/Objective: Patient is well-developed, well-nourished in no acute distress.  Resting comfortably at  home.  Head is normocephalic, atraumatic.  No labored breathing.  Speech is clear and coherent with logical content.  Patient is alert and oriented at baseline.  No noted soft tissue swelling of face or neck.  Mildly xerotic skin noted without scaling.  Lips without visible lesion or any swelling.  Assessment and Plan: 1. Allergic reaction, subsequent encounter - hydrOXYzine (VISTARIL) 25 MG capsule; Take 1 capsule (25 mg total) by mouth every 8 (eight) hours as needed.  Dispense: 30 capsule; Refill: 0 Patient much improved compared to her initial evaluation on 10/21/2020.  Still having moderate itching but swelling and rash have resolved.  Some mild xerosis of skin.  We will have her complete entire course of prednisone taper as directed by the urgent care physician.  Okay to continue famotidine 40  mg nightly.  Can add on hydroxyzine up to 3 times daily daily as needed itch.  Rx sent.  Patient advised this can make her sleepy so she is not to take and then drive or operate heavy machinery.  Discussed she can use over-the-counter strength hydrocortisone to very itchy areas on her chin.  Do not apply to eyelids or lips.  Cold compresses recommended.  Can consider a hypoallergenic lotion like Cetaphil or CeraVe if needed.  Strict ER precautions reviewed with patient who voiced understanding and agreement.  Encouraged her to schedule routine follow-up with her primary care provider as giving there is no known trigger for her significant allergic reaction, she needs referral to an allergist for further evaluation.  Follow Up Instructions: I discussed the assessment and treatment plan with the patient. The patient was provided an opportunity to ask questions and all were answered. The patient agreed with the plan and demonstrated an understanding of the instructions.  A copy of instructions were sent to the patient via MyChart.  The patient was advised to call back or seek an in-person evaluation if the  symptoms worsen or if the condition fails to improve as anticipated.  Time:  I spent 12 minutes with the patient via telehealth technology discussing the above problems/concerns.    Lisa Rio, PA-C

## 2020-11-15 ENCOUNTER — Encounter: Payer: Self-pay | Admitting: Internal Medicine

## 2020-11-21 DIAGNOSIS — F4312 Post-traumatic stress disorder, chronic: Secondary | ICD-10-CM | POA: Diagnosis not present

## 2020-11-25 ENCOUNTER — Telehealth: Payer: Medicaid Other | Admitting: Family

## 2020-11-25 ENCOUNTER — Encounter: Payer: Self-pay | Admitting: Family

## 2020-11-25 DIAGNOSIS — J45909 Unspecified asthma, uncomplicated: Secondary | ICD-10-CM | POA: Diagnosis not present

## 2020-11-25 DIAGNOSIS — U071 COVID-19: Secondary | ICD-10-CM | POA: Diagnosis not present

## 2020-11-25 MED ORDER — MOLNUPIRAVIR EUA 200MG CAPSULE: 4.0000 | ORAL_CAPSULE | Freq: Two times a day (BID) | ORAL | 0 refills | Status: AC

## 2020-11-25 MED ORDER — ALBUTEROL SULFATE HFA 108 (90 BASE) MCG/ACT IN AERS
2.0000 | INHALATION_SPRAY | Freq: Four times a day (QID) | RESPIRATORY_TRACT | 0 refills | Status: DC | PRN
Start: 2020-11-25 — End: 2021-06-19

## 2020-11-25 MED ORDER — BENZONATATE 100 MG PO CAPS: 100.0000 mg | ORAL_CAPSULE | Freq: Three times a day (TID) | ORAL | 0 refills | Status: DC | PRN

## 2020-11-25 NOTE — Progress Notes (Signed)
Virtual Visit Consent   [place], you are scheduled for a virtual visit with a [facility] provider today.     Just as with appointments in the office, your consent must be obtained to participate.  Your consent will be active for this visit and any virtual visit you may have with one of our providers in the next 365 days.     If you have a MyChart account, a copy of this consent can be sent to you electronically.  All virtual visits are billed to your insurance company just like a traditional visit in the office.    As this is a virtual visit, video technology does not allow for your provider to perform a traditional examination.  This may limit your provider's ability to fully assess your condition.  If your provider identifies any concerns that need to be evaluated in person or the need to arrange testing (such as labs, EKG, etc.), we will make arrangements to do so.     Although advances in technology are sophisticated, we cannot ensure that it will always work on either your end or our end.  If the connection with a video visit is poor, the visit may have to be switched to a telephone visit.  With either a video or telephone visit, we are not always able to ensure that we have a secure connection.     I need to obtain your verbal consent now.   Are you willing to proceed with your visit today?    Lisa Nash has provided verbal consent on 11/25/2020 for a virtual visit (video or telephone).   [name], FNP   Date: 11/25/2020 1:00 PM   Virtual Visit via Video Note   I, [name], connected with  Lisa Nash  ([id number], 1970-11-26) on 11/25/20 at  1:00 PM EDT by a video-enabled telemedicine application and verified that I am speaking with the correct person using two identifiers.  Location: Patient: Virtual Visit Location Patient: Home Provider: Virtual Visit Location Provider: Home   I discussed the limitations of evaluation and management by  telemedicine and the availability of in person appointments. The patient expressed understanding and agreed to proceed.    History of Present Illness: Lisa Nash is a 50 y.o. who identifies as a female who was assigned female at birth, and is being seen today for COVID. She reports she was diagnosed 11/24/20 and her symptoms started on 11/22/20.  HPI: Cough This is a new problem. The current episode started in the past 7 days. The problem has been waxing and waning. The problem occurs every few minutes. The cough is Non-productive. Associated symptoms include chills, ear congestion, ear pain (left), headaches, myalgias, nasal congestion, postnasal drip, a sore throat and wheezing. Pertinent negatives include no fever or shortness of breath. She has tried rest (tylenol) for the symptoms. Her past medical history is significant for asthma.   Problems:  Patient Active Problem List   Diagnosis Date Noted   Grief at loss of child 11/16/2019   Adjustment disorder with depressed mood 11/16/2019   Hair loss 11/16/2019   Essential hypertension, benign 11/16/2019   Bruising 03/25/2014   Eczema     Allergies:  Allergies  Allergen Reactions   Adhesive [Tape]    Pollen Extract    Medications:  Current Outpatient Medications:    albuterol (VENTOLIN HFA) 108 (90 Base) MCG/ACT inhaler, Inhale 2 puffs into the lungs every 6 (six) hours as needed for wheezing  or shortness of breath., Disp: 8 g, Rfl: 0   benzonatate (TESSALON PERLES) 100 MG capsule, Take 1 capsule (100 mg total) by mouth 3 (three) times daily as needed., Disp: 20 capsule, Rfl: 0   molnupiravir EUA 200 mg CAPS, Take 4 capsules (800 mg total) by mouth 2 (two) times daily for 5 days., Disp: 40 capsule, Rfl: 0   beclomethasone (QVAR) 80 MCG/ACT inhaler, Inhale 2 puffs into the lungs 2 (two) times daily., Disp: 1 Inhaler, Rfl: 5   cetirizine (ZYRTEC) 10 MG tablet, Take 1 tablet (10 mg total) by mouth daily., Disp: 90 tablet, Rfl: 2    EPINEPHrine 0.3 mg/0.3 mL IJ SOAJ injection, Inject 0.3 mg into the muscle as needed for anaphylaxis., Disp: 1 each, Rfl: 0   estradiol (VIVELLE-DOT) 0.025 MG/24HR, Place onto the skin., Disp: , Rfl:    famotidine (PEPCID) 40 MG tablet, Take 1 tablet (40 mg total) by mouth at bedtime., Disp: 10 tablet, Rfl: 0   fexofenadine (ALLEGRA) 60 MG tablet, Take 60 mg by mouth 2 (two) times daily., Disp: , Rfl:    halobetasol (ULTRAVATE) 0.05 % ointment, APPLY EXTERNALLY TO THE AFFECTED AREA TWICE DAILY AS NEEDED FOR ALLERGIC REACTION OR RASH, Disp: 50 g, Rfl: 0   hydrochlorothiazide (MICROZIDE) 12.5 MG capsule, TAKE 1 CAPSULE(12.5 MG) BY MOUTH DAILY, Disp: 90 capsule, Rfl: 1   hydrOXYzine (VISTARIL) 25 MG capsule, Take 1 capsule (25 mg total) by mouth every 8 (eight) hours as needed., Disp: 30 capsule, Rfl: 0   ibuprofen (ADVIL) 800 MG tablet, Take 1 tablet (800 mg total) by mouth every 6 (six) hours as needed., Disp: 30 tablet, Rfl: 0   megestrol (MEGACE) 40 MG tablet, Take by mouth., Disp: , Rfl:    MOBIC 15 MG tablet, Take 15 mg by mouth daily., Disp: , Rfl:    montelukast (SINGULAIR) 10 MG tablet, TAKE 1 TABLET BY MOUTH EVERY DAY IN THE EVENING, Disp: 90 tablet, Rfl: 3   predniSONE (STERAPRED UNI-PAK 21 TAB) 10 MG (21) TBPK tablet, As directed, Disp: 21 tablet, Rfl: 0   sertraline (ZOLOFT) 25 MG tablet, Take by mouth., Disp: , Rfl:    Vitamin D, Ergocalciferol, (DRISDOL) 1.25 MG (50000 UNIT) CAPS capsule, TAKE 1 CAPSULE BY MOUTH 2 TIMES A WEEK, Disp: 24 capsule, Rfl: 1  Observations/Objective: Patient is well-developed, well-nourished in no acute distress.  Resting comfortably  at home.  Head is normocephalic, atraumatic.  No labored breathing. Constant productive cough, mild SOB while coughing.  Speech is clear and coherent with logical content.  Patient is alert and oriented at baseline.    Assessment and Plan: 1. COVID-19 virus detected - molnupiravir EUA 200 mg CAPS; Take 4 capsules (800 mg  total) by mouth 2 (two) times daily for 5 days.  Dispense: 40 capsule; Refill: 0 - albuterol (VENTOLIN HFA) 108 (90 Base) MCG/ACT inhaler; Inhale 2 puffs into the lungs every 6 (six) hours as needed for wheezing or shortness of breath.  Dispense: 8 g; Refill: 0 - benzonatate (TESSALON PERLES) 100 MG capsule; Take 1 capsule (100 mg total) by mouth 3 (three) times daily as needed.  Dispense: 20 capsule; Refill: 0  2. Uncomplicated asthma, unspecified asthma severity, unspecified whether persistent - molnupiravir EUA 200 mg CAPS; Take 4 capsules (800 mg total) by mouth 2 (two) times daily for 5 days.  Dispense: 40 capsule; Refill: 0 - albuterol (VENTOLIN HFA) 108 (90 Base) MCG/ACT inhaler; Inhale 2 puffs into the lungs every 6 (six) hours as  needed for wheezing or shortness of breath.  Dispense: 8 g; Refill: 0 - benzonatate (TESSALON PERLES) 100 MG capsule; Take 1 capsule (100 mg total) by mouth 3 (three) times daily as needed.  Dispense: 20 capsule; Refill: 0 COVID positive, rest, force fluids, tylenol as needed, Quarantine for at least 5 days and fever free, report any worsening symptoms such as increased shortness of breath, swelling, or continued high fevers.  Possible Adverse effects discussed with molnupiravir  Follow Up Instructions: I discussed the assessment and treatment plan with the patient. The patient was provided an opportunity to ask questions and all were answered. The patient agreed with the plan and demonstrated an understanding of the instructions.  A copy of instructions were sent to the patient via MyChart.  The patient was advised to call back or seek an in-person evaluation if the symptoms worsen or if the condition fails to improve as anticipated.  Time:  I spent 7 minutes with the patient via telehealth technology discussing the above problems/concerns.    [name], FNP

## 2020-12-19 DIAGNOSIS — F4312 Post-traumatic stress disorder, chronic: Secondary | ICD-10-CM | POA: Diagnosis not present

## 2020-12-24 ENCOUNTER — Telehealth: Payer: Self-pay

## 2020-12-24 NOTE — Telephone Encounter (Signed)
Patient agrees to hold montelukast for 2 weeks to see if it helps with mood. Pt also states that she has been taking her hctz as directed.

## 2021-01-09 DIAGNOSIS — M7660 Achilles tendinitis, unspecified leg: Secondary | ICD-10-CM | POA: Diagnosis not present

## 2021-01-28 DIAGNOSIS — L659 Nonscarring hair loss, unspecified: Secondary | ICD-10-CM | POA: Diagnosis not present

## 2021-01-28 DIAGNOSIS — L668 Other cicatricial alopecia: Secondary | ICD-10-CM | POA: Diagnosis not present

## 2021-01-28 DIAGNOSIS — L658 Other specified nonscarring hair loss: Secondary | ICD-10-CM | POA: Diagnosis not present

## 2021-01-28 DIAGNOSIS — L669 Cicatricial alopecia, unspecified: Secondary | ICD-10-CM | POA: Diagnosis not present

## 2021-02-07 DIAGNOSIS — L658 Other specified nonscarring hair loss: Secondary | ICD-10-CM | POA: Diagnosis not present

## 2021-02-07 DIAGNOSIS — L669 Cicatricial alopecia, unspecified: Secondary | ICD-10-CM | POA: Diagnosis not present

## 2021-03-04 ENCOUNTER — Other Ambulatory Visit: Payer: Self-pay

## 2021-03-04 ENCOUNTER — Ambulatory Visit (INDEPENDENT_AMBULATORY_CARE_PROVIDER_SITE_OTHER): Payer: Medicaid Other | Admitting: Internal Medicine

## 2021-03-04 ENCOUNTER — Encounter: Payer: Self-pay | Admitting: Internal Medicine

## 2021-03-04 VITALS — BP 138/72 | HR 80 | Temp 98.2°F | Ht 63.2 in | Wt 198.6 lb

## 2021-03-04 DIAGNOSIS — Z1211 Encounter for screening for malignant neoplasm of colon: Secondary | ICD-10-CM

## 2021-03-04 DIAGNOSIS — Z716 Tobacco abuse counseling: Secondary | ICD-10-CM

## 2021-03-04 DIAGNOSIS — I10 Essential (primary) hypertension: Secondary | ICD-10-CM

## 2021-03-04 DIAGNOSIS — E559 Vitamin D deficiency, unspecified: Secondary | ICD-10-CM

## 2021-03-04 DIAGNOSIS — Z0000 Encounter for general adult medical examination without abnormal findings: Secondary | ICD-10-CM

## 2021-03-04 DIAGNOSIS — E6609 Other obesity due to excess calories: Secondary | ICD-10-CM

## 2021-03-04 DIAGNOSIS — Z6834 Body mass index (BMI) 34.0-34.9, adult: Secondary | ICD-10-CM

## 2021-03-04 DIAGNOSIS — R9431 Abnormal electrocardiogram [ECG] [EKG]: Secondary | ICD-10-CM

## 2021-03-04 LAB — POCT URINALYSIS DIPSTICK
Bilirubin, UA: NEGATIVE
Blood, UA: NEGATIVE
Glucose, UA: NEGATIVE
Ketones, UA: NEGATIVE
Leukocytes, UA: NEGATIVE
Nitrite, UA: NEGATIVE
Protein, UA: NEGATIVE
Spec Grav, UA: 1.01 (ref 1.010–1.025)
Urobilinogen, UA: 0.2 E.U./dL
pH, UA: 5 (ref 5.0–8.0)

## 2021-03-04 LAB — POCT UA - MICROALBUMIN
Albumin/Creatinine Ratio, Urine, POC: 30
Creatinine, POC: 50 mg/dL
Microalbumin Ur, POC: 10 mg/L

## 2021-03-04 NOTE — Patient Instructions (Signed)
Health Maintenance, Female Adopting a healthy lifestyle and getting preventive care are important in promoting health and wellness. Ask your health care provider about: The right schedule for you to have regular tests and exams. Things you can do on your own to prevent diseases and keep yourself healthy. What should I know about diet, weight, and exercise? Eat a healthy diet  Eat a diet that includes plenty of vegetables, fruits, low-fat dairy products, and lean protein. Do not eat a lot of foods that are high in solid fats, added sugars, or sodium. Maintain a healthy weight Body mass index (BMI) is used to identify weight problems. It estimates body fat based on height and weight. Your health care provider can help determine your BMI and help you achieve or maintain a healthy weight. Get regular exercise Get regular exercise. This is one of the most important things you can do for your health. Most adults should: Exercise for at least 150 minutes each week. The exercise should increase your heart rate and make you sweat (moderate-intensity exercise). Do strengthening exercises at least twice a week. This is in addition to the moderate-intensity exercise. Spend less time sitting. Even light physical activity can be beneficial. Watch cholesterol and blood lipids Have your blood tested for lipids and cholesterol at 50 years of age, then have this test every 5 years. Have your cholesterol levels checked more often if: Your lipid or cholesterol levels are high. You are older than 50 years of age. You are at high risk for heart disease. What should I know about cancer screening? Depending on your health history and family history, you may need to have cancer screening at various ages. This may include screening for: Breast cancer. Cervical cancer. Colorectal cancer. Skin cancer. Lung cancer. What should I know about heart disease, diabetes, and high blood pressure? Blood pressure and heart  disease High blood pressure causes heart disease and increases the risk of stroke. This is more likely to develop in people who have high blood pressure readings, are of African descent, or are overweight. Have your blood pressure checked: Every 3-5 years if you are 18-39 years of age. Every year if you are 40 years old or older. Diabetes Have regular diabetes screenings. This checks your fasting blood sugar level. Have the screening done: Once every three years after age 40 if you are at a normal weight and have a low risk for diabetes. More often and at a younger age if you are overweight or have a high risk for diabetes. What should I know about preventing infection? Hepatitis B If you have a higher risk for hepatitis B, you should be screened for this virus. Talk with your health care provider to find out if you are at risk for hepatitis B infection. Hepatitis C Testing is recommended for: Everyone born from 1945 through 1965. Anyone with known risk factors for hepatitis C. Sexually transmitted infections (STIs) Get screened for STIs, including gonorrhea and chlamydia, if: You are sexually active and are younger than 50 years of age. You are older than 50 years of age and your health care provider tells you that you are at risk for this type of infection. Your sexual activity has changed since you were last screened, and you are at increased risk for chlamydia or gonorrhea. Ask your health care provider if you are at risk. Ask your health care provider about whether you are at high risk for HIV. Your health care provider may recommend a prescription medicine   to help prevent HIV infection. If you choose to take medicine to prevent HIV, you should first get tested for HIV. You should then be tested every 3 months for as long as you are taking the medicine. Pregnancy If you are about to stop having your period (premenopausal) and you may become pregnant, seek counseling before you get  pregnant. Take 400 to 800 micrograms (mcg) of folic acid every day if you become pregnant. Ask for birth control (contraception) if you want to prevent pregnancy. Osteoporosis and menopause Osteoporosis is a disease in which the bones lose minerals and strength with aging. This can result in bone fractures. If you are 65 years old or older, or if you are at risk for osteoporosis and fractures, ask your health care provider if you should: Be screened for bone loss. Take a calcium or vitamin D supplement to lower your risk of fractures. Be given hormone replacement therapy (HRT) to treat symptoms of menopause. Follow these instructions at home: Lifestyle Do not use any products that contain nicotine or tobacco, such as cigarettes, e-cigarettes, and chewing tobacco. If you need help quitting, ask your health care provider. Do not use street drugs. Do not share needles. Ask your health care provider for help if you need support or information about quitting drugs. Alcohol use Do not drink alcohol if: Your health care provider tells you not to drink. You are pregnant, may be pregnant, or are planning to become pregnant. If you drink alcohol: Limit how much you use to 0-1 drink a day. Limit intake if you are breastfeeding. Be aware of how much alcohol is in your drink. In the [place] one drink equals one 12 oz bottle of beer (355 mL), one 5 oz glass of wine (148 mL), or one 1 oz glass of hard liquor (44 mL). General instructions Schedule regular health, dental, and eye exams. Stay current with your vaccines. Tell your health care provider if: You often feel depressed. You have ever been abused or do not feel safe at home. Summary Adopting a healthy lifestyle and getting preventive care are important in promoting health and wellness. Follow your health care provider's instructions about healthy diet, exercising, and getting tested or screened for diseases. Follow your health care provider's  instructions on monitoring your cholesterol and blood pressure. This information is not intended to replace advice given to you by your health care provider. Make sure you discuss any questions you have with your health care provider. Document Revised: 06/29/2020 Document Reviewed: 04/14/2018 Elsevier Patient Education  2022 [employer]  

## 2021-03-04 NOTE — Progress Notes (Signed)
I,Tianna Badgett,acting as a Education administrator for [name], MD.,have documented all relevant documentation on the behalf of [name], MD,as directed by  [name], MD while in the presence of [name], MD.  This visit occurred during the SARS-CoV-2 public health emergency.  Safety protocols were in place, including screening questions prior to the visit, additional usage of staff PPE, and extensive cleaning of exam room while observing appropriate contact time as indicated for disinfecting solutions.  Subjective:     Patient ID: Lisa Nash , female    DOB: 1971/03/30 , 50 y.o.   MRN: [id number]   Chief Complaint  Patient presents with   Annual Exam   Hypertension    HPI  The patient is here today for a physical examination.  She is followed by GYN at [facility] for her pelvic exams. She reports compliance with meds. She denies headaches, chest pain and shortness of breath.   Hypertension This is a chronic problem. The current episode started more than 1 year ago. The problem has been gradually improving since onset. The problem is controlled. Pertinent negatives include no blurred vision, chest pain, palpitations or shortness of breath. The current treatment provides moderate improvement. Compliance problems include exercise.     Past Medical History:  Diagnosis Date   Arthritis    Asthma    Eczema      Family History  Problem Relation Age of Onset   Hypertension Mother    Hypertension Father    Arthritis Father      Current Outpatient Medications:    albuterol (VENTOLIN HFA) 108 (90 Base) MCG/ACT inhaler, Inhale 2 puffs into the lungs every 6 (six) hours as needed for wheezing or shortness of breath., Disp: 8 g, Rfl: 0   beclomethasone (QVAR) 80 MCG/ACT inhaler, Inhale 2 puffs into the lungs 2 (two) times daily., Disp: 1 Inhaler, Rfl: 5   cetirizine (ZYRTEC) 10 MG tablet, Take 1 tablet (10 mg total) by mouth daily., Disp: 90 tablet, Rfl: 2    EPINEPHrine 0.3 mg/0.3 mL IJ SOAJ injection, Inject 0.3 mg into the muscle as needed for anaphylaxis., Disp: 1 each, Rfl: 0   famotidine (PEPCID) 40 MG tablet, Take 1 tablet (40 mg total) by mouth at bedtime., Disp: 10 tablet, Rfl: 0   fexofenadine (ALLEGRA) 60 MG tablet, Take 60 mg by mouth 2 (two) times daily., Disp: , Rfl:    halobetasol (ULTRAVATE) 0.05 % ointment, APPLY EXTERNALLY TO THE AFFECTED AREA TWICE DAILY AS NEEDED FOR ALLERGIC REACTION OR RASH, Disp: 50 g, Rfl: 0   hydrochlorothiazide (MICROZIDE) 12.5 MG capsule, TAKE 1 CAPSULE(12.5 MG) BY MOUTH DAILY, Disp: 90 capsule, Rfl: 1   ibuprofen (ADVIL) 800 MG tablet, Take 1 tablet (800 mg total) by mouth every 6 (six) hours as needed., Disp: 30 tablet, Rfl: 0   MOBIC 15 MG tablet, Take 15 mg by mouth daily., Disp: , Rfl:    montelukast (SINGULAIR) 10 MG tablet, TAKE 1 TABLET BY MOUTH EVERY DAY IN THE EVENING, Disp: 90 tablet, Rfl: 3   sertraline (ZOLOFT) 25 MG tablet, Take by mouth., Disp: , Rfl:    Vitamin D, Ergocalciferol, (DRISDOL) 1.25 MG (50000 UNIT) CAPS capsule, TAKE 1 CAPSULE BY MOUTH 2 TIMES A WEEK, Disp: 24 capsule, Rfl: 1   Allergies  Allergen Reactions   Adhesive [Tape]    Pollen Extract       The patient states she uses status post hysterectomy for birth control. Last LMP was No  LMP recorded (lmp unknown). Patient has had a hysterectomy.. Negative for Dysmenorrhea. Negative for: breast discharge, breast lump(s), breast pain and breast self exam. Associated symptoms include abnormal vaginal bleeding. Pertinent negatives include abnormal bleeding (hematology), anxiety, decreased libido, depression, difficulty falling sleep, dyspareunia, history of infertility, nocturia, sexual dysfunction, sleep disturbances, urinary incontinence, urinary urgency, vaginal discharge and vaginal itching. Diet regular.The patient states her exercise level is  interimttent.   . The patient's tobacco use is:  Social History   Tobacco Use   Smoking Status Some Days   Packs/day: 0.50   Years: 6.00   Pack years: 3.00   Types: Cigarettes   Start date: 49   Last attempt to quit: 07/09/2005   Years since quitting: 15.6  Smokeless Tobacco Never  Tobacco Comments   Started smoking 1992 1ppd x 7 years, 1/2 ppd x 8 years prior to that. Quit in 2007, started back in 2014. 1/2ppd since 2014  . She has been exposed to passive smoke. The patient's alcohol use is:  Social History   Substance and Sexual Activity  Alcohol Use Yes   Alcohol/week: 3.0 - 4.0 standard drinks   Types: 1 Glasses of wine, 2 - 3 Cans of beer per week   Comment: drinks beer 2-3 times per week, wine occassionally   Review of Systems  Constitutional: Negative.   HENT: Negative.    Eyes: Negative.  Negative for blurred vision.  Respiratory: Negative.  Negative for shortness of breath.   Cardiovascular: Negative.  Negative for chest pain and palpitations.  Gastrointestinal: Negative.   Endocrine: Negative.   Genitourinary: Negative.   Musculoskeletal: Negative.   Skin: Negative.   Allergic/Immunologic: Negative.   Neurological: Negative.   Hematological: Negative.   Psychiatric/Behavioral: Negative.      Today's Vitals   03/04/21 1422  BP: 138/72  Pulse: 80  Temp: 98.2 F (36.8 C)  TempSrc: Oral  Weight: 198 lb 9.6 oz (90.1 kg)  Height: 5' 3.2" (1.605 m)   Body mass index is 34.96 kg/m.  Wt Readings from Last 3 Encounters:  03/04/21 198 lb 9.6 oz (90.1 kg)  08/15/20 191 lb 6.4 oz (86.8 kg)  03/21/20 188 lb (85.3 kg)    Objective:  Physical Exam Vitals and nursing note reviewed.  Constitutional:      Appearance: Normal appearance.  HENT:     Head: Normocephalic and atraumatic.     Right Ear: Tympanic membrane, ear canal and external ear normal.     Left Ear: Tympanic membrane, ear canal and external ear normal.     Nose:     Comments: Masked     Mouth/Throat:     Comments: Masked  Eyes:     Extraocular Movements: Extraocular  movements intact.     Conjunctiva/sclera: Conjunctivae normal.     Pupils: Pupils are equal, round, and reactive to light.  Cardiovascular:     Rate and Rhythm: Normal rate and regular rhythm.     Pulses: Normal pulses.     Heart sounds: Normal heart sounds.  Pulmonary:     Effort: Pulmonary effort is normal.     Breath sounds: Normal breath sounds. No wheezing or rhonchi.  Chest:  Breasts:    Tanner Score is 5.     Right: Normal.     Left: Normal.  Abdominal:     General: Abdomen is flat. Bowel sounds are normal.     Palpations: Abdomen is soft.  Genitourinary:    Comments: deferred Musculoskeletal:  General: Normal range of motion.     Cervical back: Normal range of motion and neck supple.  Skin:    General: Skin is warm and dry.  Neurological:     General: No focal deficit present.     Mental Status: She is alert and oriented to person, place, and time.  Psychiatric:        Mood and Affect: Mood normal.        Behavior: Behavior normal.        Assessment And Plan:     1. Health maintenance examination Comments: A full exam was performed. Importance of monthly self breast exams was discussed with the patient. I will also refer her to GI for CRC screening. PATIENT IS ADVISED TO GET 30-45 MINUTES REGULAR EXERCISE NO LESS THAN FOUR TO FIVE DAYS PER WEEK - BOTH WEIGHTBEARING EXERCISES AND AEROBIC ARE RECOMMENDED.  PATIENT IS ADVISED TO FOLLOW A HEALTHY DIET WITH AT LEAST SIX FRUITS/VEGGIES PER DAY, DECREASE INTAKE OF RED MEAT, AND TO INCREASE FISH INTAKE TO TWO DAYS PER WEEK.  MEATS/FISH SHOULD NOT BE FRIED, BAKED OR BROILED IS PREFERABLE.  IT IS ALSO IMPORTANT TO CUT BACK ON YOUR SUGAR INTAKE. PLEASE AVOID ANYTHING WITH ADDED SUGAR, CORN SYRUP OR OTHER SWEETENERS. IF YOU MUST USE A SWEETENER, YOU CAN TRY STEVIA. IT IS ALSO IMPORTANT TO AVOID ARTIFICIALLY SWEETENERS AND DIET BEVERAGES. LASTLY, I SUGGEST WEARING SPF 50 SUNSCREEN ON EXPOSED PARTS AND ESPECIALLY WHEN IN THE  DIRECT SUNLIGHT FOR AN EXTENDED PERIOD OF TIME.  PLEASE AVOID FAST FOOD RESTAURANTS AND INCREASE YOUR WATER INTAKE.  2. Essential hypertension, benign Comments: Chronic, fair control. Goal BP is less than 130/80. She admits to smoking a cig just prior to appt. EKG performed, Atrial flutter w/ HR. However, I do not think this is truly atrial flutter. I did send communicate w/ Cardiology who agreed. Encouraged to take meds as directed and to follow a low sodium diet. She is reminded that smoking will also contribute to uncontrolled HTN.  - POCT Urinalysis Dipstick (81002) - POCT UA - Microalbumin - EKG 12-Lead - CBC - CMP14+EGFR - Lipid panel - Insulin, random(561) - TSH  3. Abnormal EKG Comments: EKG shows atrial flutter, repeat shows the same. I will refer her to Cardiology for formal evaluation/discussion of cardiac risk; although, I do not think her EKG is truly atrial flutter.  I will check electrolytes today and thyroid function. Encouraged to stop smoking. Agrees to Cardiology eval.  - TSH  4. Vitamin D deficiency disease Comments: I will check vitamin D level today and supplement as needed.  - VITAMIN D 25 Hydroxy (Vit-D Deficiency, Fractures)  5. Class 1 obesity due to excess calories with serious comorbidity and body mass index (BMI) of 34.0 to 34.9 in adult Comments:  She is encouraged to strive for BMI less than 30 to decrease cardiac risk. Advised to aim for at least 150 minutes of exercise per week.  6. Screen for colon cancer Comments: She agrees to GI referral for CRC screening.  - Ambulatory referral to Gastroenterology  7. Encounter for tobacco use cessation counseling Smoking cessation instruction/counseling given:  counseled patient on the dangers of tobacco use, advised patient to stop smoking, and reviewed strategies to maximize success.   Patient was given opportunity to ask questions. Patient verbalized understanding of the plan and was able to repeat key  elements of the plan. All questions were answered to their satisfaction.   I, [name], MD, have reviewed  all documentation for this visit. The documentation on 03/04/21 for the exam, diagnosis, procedures, and orders are all accurate and complete.   THE PATIENT IS ENCOURAGED TO PRACTICE SOCIAL DISTANCING DUE TO THE COVID-19 PANDEMIC.

## 2021-03-05 LAB — CMP14+EGFR
ALT: 26 IU/L (ref 0–32)
AST: 19 IU/L (ref 0–40)
Albumin/Globulin Ratio: 1.5 (ref 1.2–2.2)
Albumin: 4.6 g/dL (ref 3.8–4.8)
Alkaline Phosphatase: 93 IU/L (ref 44–121)
BUN/Creatinine Ratio: 11 (ref 9–23)
BUN: 8 mg/dL (ref 6–24)
Bilirubin Total: <0.2 mg/dL (ref 0.0–1.2)
CO2: 23 mmol/L (ref 20–29)
Calcium: 9.9 mg/dL (ref 8.7–10.2)
Chloride: 101 mmol/L (ref 96–106)
Creatinine, Ser: 0.75 mg/dL (ref 0.57–1.00)
Globulin, Total: 3 g/dL (ref 1.5–4.5)
Glucose: 85 mg/dL (ref 70–99)
Potassium: 4.6 mmol/L (ref 3.5–5.2)
Sodium: 139 mmol/L (ref 134–144)
Total Protein: 7.6 g/dL (ref 6.0–8.5)
eGFR: 97 mL/min/{1.73_m2} (ref >59)

## 2021-03-05 LAB — CBC
Hematocrit: 42.8 % (ref 34.0–46.6)
Hemoglobin: 13.8 g/dL (ref 11.1–15.9)
MCH: 29.4 pg (ref 26.6–33.0)
MCHC: 32.2 g/dL (ref 31.5–35.7)
MCV: 91 fL (ref 79–97)
Platelets: 354 10*3/uL (ref 150–450)
RBC: 4.69 x10E6/uL (ref 3.77–5.28)
RDW: 13.1 % (ref 11.7–15.4)
WBC: 9 10*3/uL (ref 3.4–10.8)

## 2021-03-05 LAB — LIPID PANEL
Chol/HDL Ratio: 4.4 ratio (ref 0.0–4.4)
Cholesterol, Total: 193 mg/dL (ref 100–199)
HDL: 44 mg/dL (ref >39)
LDL Chol Calc (NIH): 120 mg/dL — ABNORMAL HIGH (ref 0–99)
Triglycerides: 162 mg/dL — ABNORMAL HIGH (ref 0–149)
VLDL Cholesterol Cal: 29 mg/dL (ref 5–40)

## 2021-03-05 LAB — VITAMIN D 25 HYDROXY (VIT D DEFICIENCY, FRACTURES): Vit D, 25-Hydroxy: 19.1 ng/mL — ABNORMAL LOW (ref 30.0–100.0)

## 2021-03-05 LAB — TSH: TSH: 0.721 u[IU]/mL (ref 0.450–4.500)

## 2021-03-05 LAB — INSULIN, RANDOM: INSULIN: 25.7 u[IU]/mL — ABNORMAL HIGH (ref 2.6–24.9)

## 2021-04-03 ENCOUNTER — Encounter: Payer: Self-pay | Admitting: Internal Medicine

## 2021-04-03 DIAGNOSIS — G4730 Sleep apnea, unspecified: Secondary | ICD-10-CM | POA: Diagnosis not present

## 2021-04-03 DIAGNOSIS — J45909 Unspecified asthma, uncomplicated: Secondary | ICD-10-CM | POA: Diagnosis not present

## 2021-04-03 DIAGNOSIS — K219 Gastro-esophageal reflux disease without esophagitis: Secondary | ICD-10-CM | POA: Diagnosis not present

## 2021-04-03 DIAGNOSIS — Z1211 Encounter for screening for malignant neoplasm of colon: Secondary | ICD-10-CM | POA: Diagnosis not present

## 2021-04-10 ENCOUNTER — Telehealth: Payer: Self-pay

## 2021-04-10 ENCOUNTER — Other Ambulatory Visit: Payer: Self-pay | Admitting: Internal Medicine

## 2021-04-10 DIAGNOSIS — J45909 Unspecified asthma, uncomplicated: Secondary | ICD-10-CM

## 2021-04-10 NOTE — Telephone Encounter (Signed)
The pt wanted to know if her insurance will cover the zoster if she gets it here because the pharmacy said her insurance doesn't cover it. The pt was notified that the office manager said she doesn't see why the pt's vaccination wouldn't be covered but if it's not then the pt would have a balance.

## 2021-04-16 ENCOUNTER — Ambulatory Visit (INDEPENDENT_AMBULATORY_CARE_PROVIDER_SITE_OTHER): Payer: Medicaid Other

## 2021-04-16 ENCOUNTER — Other Ambulatory Visit: Payer: Self-pay

## 2021-04-16 VITALS — BP 124/78 | HR 99 | Temp 97.7°F | Ht 63.0 in | Wt 199.0 lb

## 2021-04-16 DIAGNOSIS — Z23 Encounter for immunization: Secondary | ICD-10-CM | POA: Diagnosis not present

## 2021-04-16 NOTE — Progress Notes (Signed)
The patient is here for a Shingrix vaccination.

## 2021-04-16 NOTE — Patient Instructions (Signed)
Zoster Vaccine, Recombinant injection What is this medication? ZOSTER VACCINE (ZOS ter vak SEEN) is a vaccine used to reduce the risk of getting shingles. This vaccine is not used to treat shingles or nerve pain from shingles. This medicine may be used for other purposes; ask your health care provider or pharmacist if you have questions. COMMON BRAND NAME(S): [facility] What should I tell my care team before I take this medication? They need to know if you have any of these conditions: cancer immune system problems an unusual or allergic reaction to Zoster vaccine, other medications, foods, dyes, or preservatives pregnant or trying to get pregnant breast-feeding How should I use this medication? This vaccine is injected into a muscle. It is given by a health care provider. A copy of Vaccine Information Statements will be given before each vaccination. Be sure to read this information carefully each time. This sheet may change often. Talk to your health care provider about the use of this vaccine in children. This vaccine is not approved for use in children. Overdosage: If you think you have taken too much of this medicine contact a poison control center or emergency room at once. NOTE: This medicine is only for you. Do not share this medicine with others. What if I miss a dose? Keep appointments for follow-up (booster) doses. It is important not to miss your dose. Call your health care provider if you are unable to keep an appointment. What may interact with this medication? medicines that suppress your immune system medicines to treat cancer steroid medicines like prednisone or cortisone This list may not describe all possible interactions. Give your health care provider a list of all the medicines, herbs, non-prescription drugs, or dietary supplements you use. Also tell them if you smoke, drink alcohol, or use illegal drugs. Some items may interact with your medicine. What should I watch for  while using this medication? Visit your health care provider regularly. This vaccine, like all vaccines, may not fully protect everyone. What side effects may I notice from receiving this medication? Side effects that you should report to your doctor or health care professional as soon as possible: allergic reactions (skin rash, itching or hives; swelling of the face, lips, or tongue) trouble breathing Side effects that usually do not require medical attention (report these to your doctor or health care professional if they continue or are bothersome): chills headache fever nausea pain, redness, or irritation at site where injected tiredness vomiting This list may not describe all possible side effects. Call your doctor for medical advice about side effects. You may report side effects to FDA at 1-800-FDA-1088. Where should I keep my medication? This vaccine is only given by a health care provider. It will not be stored at home. NOTE: This sheet is a summary. It may not cover all possible information. If you have questions about this medicine, talk to your doctor, pharmacist, or health care provider.  2022 Elsevier/Gold Standard (2021-01-08 00:00:00)

## 2021-04-17 ENCOUNTER — Other Ambulatory Visit: Payer: Self-pay | Admitting: Internal Medicine

## 2021-04-18 ENCOUNTER — Other Ambulatory Visit: Payer: Self-pay

## 2021-04-18 MED ORDER — IBUPROFEN 800 MG PO TABS: 800.0000 mg | ORAL_TABLET | Freq: Four times a day (QID) | ORAL | 0 refills | Status: DC | PRN

## 2021-04-22 ENCOUNTER — Encounter: Payer: Self-pay | Admitting: Internal Medicine

## 2021-04-23 ENCOUNTER — Other Ambulatory Visit: Payer: Self-pay

## 2021-04-23 NOTE — Telephone Encounter (Signed)
The patient said she doesn't take sertraline  she only took it one time.  She will stop the montelukast for 2 wks as directed because it has been show to be associated with a depressed mood.

## 2021-05-02 ENCOUNTER — Encounter: Payer: Self-pay | Admitting: Internal Medicine

## 2021-05-02 DIAGNOSIS — Z1211 Encounter for screening for malignant neoplasm of colon: Secondary | ICD-10-CM | POA: Diagnosis not present

## 2021-05-02 LAB — HM COLONOSCOPY

## 2021-05-22 ENCOUNTER — Ambulatory Visit (INDEPENDENT_AMBULATORY_CARE_PROVIDER_SITE_OTHER): Payer: Medicaid Other

## 2021-05-22 ENCOUNTER — Ambulatory Visit: Payer: Medicaid Other | Admitting: Podiatry

## 2021-05-22 ENCOUNTER — Other Ambulatory Visit: Payer: Self-pay

## 2021-05-22 DIAGNOSIS — M7662 Achilles tendinitis, left leg: Secondary | ICD-10-CM | POA: Diagnosis not present

## 2021-05-22 DIAGNOSIS — M722 Plantar fascial fibromatosis: Secondary | ICD-10-CM | POA: Diagnosis not present

## 2021-05-22 MED ORDER — MELOXICAM 15 MG PO TABS: 15.0000 mg | ORAL_TABLET | Freq: Every day | ORAL | 0 refills | Status: DC

## 2021-05-22 MED ORDER — DEXAMETHASONE SODIUM PHOSPHATE 120 MG/30ML IJ SOLN
8.0000 mg | Freq: Once | INTRAMUSCULAR | Status: AC
  Administered 2021-05-22: 8 mg via INTRA_ARTICULAR

## 2021-05-22 NOTE — Progress Notes (Signed)
°  Subjective:  Patient ID: Lisa Nash, female    DOB: Sep 03, 1970,   MRN: [id number]  Chief Complaint  Patient presents with   Foot Problem    Pt is here due to BIL foot problem since 2021.    51 y.o. female presents for bilateral foot pain that has been present for a couple years. Reports that in 2014 she injured her left ankle and was being treated by Dr. Percell Miller. She also relates she was diagnosed with plantar fasciitis at that time. Has not tried any treatments. Has had this problem in the past and was placed in a boot and given meloxicam and resolved.  Denies any other pedal complaints. Denies n/v/f/c.   Past Medical History:  Diagnosis Date   Arthritis    Asthma    Eczema     Objective:  Physical Exam: Vascular: DP/PT pulses 2/4 bilateral. CFT <3 seconds. Normal hair growth on digits. No edema.  Skin. No lacerations or abrasions bilateral feet.  Musculoskeletal: MMT 5/5 bilateral lower extremities in DF, PF, Inversion and Eversion. Deceased ROM in DF of ankle joint.  Tender to medial calcaneal tubercle bilateral. No pain to PT bilateral Left ahcilles tender to watershed area. No pain at the insertion Pain with PF and DF.  Neurological: Sensation intact to light touch.   Assessment:   1. Plantar fasciitis of left foot   2. Plantar fasciitis of right foot   3. Tendonitis, Achilles, left      Plan:  Patient was evaluated and treated and all questions answered. Discussed plantar fasciitis with patient.  X-rays reviewed and discussed with patient. No acute fractures or dislocations noted. Mild spurring noted at inferior calcaneus.  Discussed treatment options including, ice, NSAIDS, supportive shoes, bracing, and stretching. Stretching exercises provided to be done on a daily basis.   -Xrays reviewed -Discussed Achilles insertional tendonitis and treatment options with patient.  -Discussed stretching exercises. -Heel lifts provided and discussed proper shoewear.   Prescription for meloxicam provided and sent to pharmacy. Patient requesting injection today. Procedure note below.   Follow-up 6 weeks or sooner if any problems arise. In the meantime, encouraged to call the office with any questions, concerns, change in symptoms.   Procedure:  Discussed etiology, pathology, conservative vs. surgical therapies. At this time a plantar fascial injection was recommended.  The patient agreed and a sterile skin prep was applied.  An injection consisting of  dexamethasone and marcaine mixture was infiltrated at the point of maximal tenderness on the bilateral Heel.  Bandaid applied. The patient tolerated this well and was given instructions for aftercare.    Lorenda Peck, DPM

## 2021-05-22 NOTE — Patient Instructions (Signed)

## 2021-05-23 DIAGNOSIS — L658 Other specified nonscarring hair loss: Secondary | ICD-10-CM | POA: Diagnosis not present

## 2021-05-23 DIAGNOSIS — L669 Cicatricial alopecia, unspecified: Secondary | ICD-10-CM | POA: Diagnosis not present

## 2021-06-19 ENCOUNTER — Other Ambulatory Visit: Payer: Self-pay

## 2021-06-19 ENCOUNTER — Ambulatory Visit: Payer: Medicaid Other | Admitting: Internal Medicine

## 2021-06-19 ENCOUNTER — Encounter: Payer: Self-pay | Admitting: Internal Medicine

## 2021-06-19 VITALS — BP 116/80 | HR 89 | Temp 97.8°F | Ht 63.0 in | Wt 194.4 lb

## 2021-06-19 DIAGNOSIS — E6609 Other obesity due to excess calories: Secondary | ICD-10-CM

## 2021-06-19 DIAGNOSIS — E049 Nontoxic goiter, unspecified: Secondary | ICD-10-CM | POA: Diagnosis not present

## 2021-06-19 DIAGNOSIS — Z111 Encounter for screening for respiratory tuberculosis: Secondary | ICD-10-CM

## 2021-06-19 DIAGNOSIS — I10 Essential (primary) hypertension: Secondary | ICD-10-CM

## 2021-06-19 DIAGNOSIS — Z6834 Body mass index (BMI) 34.0-34.9, adult: Secondary | ICD-10-CM

## 2021-06-19 DIAGNOSIS — E559 Vitamin D deficiency, unspecified: Secondary | ICD-10-CM | POA: Diagnosis not present

## 2021-06-19 NOTE — Progress Notes (Signed)
Rich Brave [place]ittleton,acting as a Education administrator for [name], MD.,have documented all relevant documentation on the behalf of [name], MD,as directed by  [name], MD while in the presence of [name], MD.  This visit occurred during the SARS-CoV-2 public health emergency.  Safety protocols were in place, including screening questions prior to the visit, additional usage of staff PPE, and extensive cleaning of exam room while observing appropriate contact time as indicated for disinfecting solutions.  Subjective:     Patient ID: Lisa Nash , female    DOB: 04-13-1971 , 51 y.o.   MRN: [id number]   Chief Complaint  Patient presents with   Hypertension    HPI  She is here today for blood pressure check. She reports compliance with meds. Patient does not have any questions or concerns at this time. She denies headaches, chest pain and shortness of breath.   Hypertension This is a chronic problem. The current episode started more than 1 year ago. The problem has been gradually improving since onset. The problem is controlled. Pertinent negatives include no blurred vision, chest pain, palpitations or shortness of breath. The current treatment provides moderate improvement. Compliance problems include exercise.     Past Medical History:  Diagnosis Date   Arthritis    Asthma    Eczema      Family History  Problem Relation Age of Onset   Hypertension Mother    Hypertension Father    Arthritis Father      Current Outpatient Medications:    clobetasol (TEMOVATE) 0.05 % external solution, Apply topically., Disp: , Rfl:    clobetasol ointment (TEMOVATE) 0.05 %, Apply to scalp 3-4 times per week. Not to face., Disp: , Rfl:    EPINEPHrine 0.3 mg/0.3 mL IJ SOAJ injection, Inject 0.3 mg into the muscle as needed for anaphylaxis., Disp: 1 each, Rfl: 0   hydrochlorothiazide (MICROZIDE) 12.5 MG capsule, TAKE 1 CAPSULE(12.5 MG) BY MOUTH DAILY, Disp: 90 capsule, Rfl:  1   meloxicam (MOBIC) 15 MG tablet, Take 1 tablet (15 mg total) by mouth daily., Disp: 30 tablet, Rfl: 0   minoxidil (LONITEN) 2.5 MG tablet, Take 1/2 tab by mouth daily, Disp: , Rfl:    montelukast (SINGULAIR) 10 MG tablet, TAKE 1 TABLET BY MOUTH EVERY DAY IN THE EVENING, Disp: 90 tablet, Rfl: 3   Allergies  Allergen Reactions   Bee Pollen Anaphylaxis   Adhesive [Tape]    Pollen Extract      Review of Systems  Constitutional: Negative.   Eyes:  Negative for blurred vision.  Respiratory: Negative.  Negative for shortness of breath.   Cardiovascular: Negative.  Negative for chest pain and palpitations.  Neurological: Negative.   Psychiatric/Behavioral: Negative.      Today's Vitals   06/19/21 1014  BP: 116/80  Pulse: 89  Temp: 97.8 F (36.6 C)  Weight: 194 lb 6.4 oz (88.2 kg)  Height: 5\' 3"  (1.6 m)  PainSc: 0-No pain   Body mass index is 34.44 kg/m.  Wt Readings from Last 3 Encounters:  06/19/21 194 lb 6.4 oz (88.2 kg)  04/16/21 199 lb (90.3 kg)  03/04/21 198 lb 9.6 oz (90.1 kg)     Objective:  Physical Exam Vitals and nursing note reviewed.  Constitutional:      Appearance: Normal appearance.  HENT:     Head: Normocephalic and atraumatic.     Nose:     Comments: Masked     Mouth/Throat:  Comments: Masked  Eyes:     Extraocular Movements: Extraocular movements intact.  Neck:     Thyroid: Thyromegaly present.  Cardiovascular:     Rate and Rhythm: Normal rate and regular rhythm.     Heart sounds: Normal heart sounds.  Pulmonary:     Effort: Pulmonary effort is normal.     Breath sounds: Normal breath sounds.  Musculoskeletal:     Cervical back: Normal range of motion.  Skin:    General: Skin is warm.  Neurological:     General: No focal deficit present.     Mental Status: She is alert.  Psychiatric:        Mood and Affect: Mood normal.        Behavior: Behavior normal.        Assessment And Plan:     1. Essential hypertension,  benign Comments: Chronic, well controlled. No med changes. She is encouraged to follow low sodium diet.  she will f/u in 4 months for re-evaluation.   2. Goiter Comments: I will schedule her for thyroid u/s.  - TSH + free T4 - US THYROID; Future  3. Vitamin D deficiency disease Comments: I will check vitamin D level and supplement as needed.   4. Class 1 obesity due to excess calories with serious comorbidity and body mass index (BMI) of 34.0 to 34.9 in adult Comments: She is encouraged to strive for BMI <30 to decrease cardiac risk and to aim for at least 150 minutes per week.   5. Screening-pulmonary TB Comments: I will check quantiferon TB today.  She needs this testing for employment.  - QuantiFERON-TB Gold Plus   Patient was given opportunity to ask questions. Patient verbalized understanding of the plan and was able to repeat key elements of the plan. All questions were answered to their satisfaction.   I, [name], MD, have reviewed all documentation for this visit. The documentation on 06/19/21 for the exam, diagnosis, procedures, and orders are all accurate and complete.   IF YOU HAVE BEEN REFERRED TO A SPECIALIST, IT MAY TAKE 1-2 WEEKS TO SCHEDULE/PROCESS THE REFERRAL. IF YOU HAVE NOT HEARD FROM US/SPECIALIST IN TWO WEEKS, PLEASE GIVE [place] A CALL AT [phone] X [phone].   THE PATIENT IS ENCOURAGED TO PRACTICE SOCIAL DISTANCING DUE TO THE COVID-19 PANDEMIC.

## 2021-06-19 NOTE — Patient Instructions (Signed)

## 2021-06-21 ENCOUNTER — Encounter: Payer: Self-pay | Admitting: Internal Medicine

## 2021-06-21 ENCOUNTER — Ambulatory Visit
Admission: RE | Admit: 2021-06-21 | Discharge: 2021-06-21 | Disposition: A | Discharge status: Home / Self Care | Payer: Medicaid Other | Source: Ambulatory Visit | Attending: Internal Medicine | Admitting: Internal Medicine

## 2021-06-21 DIAGNOSIS — E049 Nontoxic goiter, unspecified: Secondary | ICD-10-CM

## 2021-06-21 DIAGNOSIS — E041 Nontoxic single thyroid nodule: Secondary | ICD-10-CM | POA: Diagnosis not present

## 2021-06-22 LAB — TSH+FREE T4
Free T4: 1.01 ng/dL (ref 0.82–1.77)
TSH: 0.927 u[IU]/mL (ref 0.450–4.500)

## 2021-06-22 LAB — QUANTIFERON-TB GOLD PLUS
QuantiFERON Mitogen Value: >10 IU/mL
QuantiFERON Nil Value: 4.92 IU/mL
QuantiFERON TB1 Ag Value: 0 IU/mL
QuantiFERON TB2 Ag Value: 0 IU/mL
QuantiFERON-TB Gold Plus: NEGATIVE

## 2021-06-24 ENCOUNTER — Encounter: Payer: Self-pay | Admitting: Internal Medicine

## 2021-06-24 ENCOUNTER — Other Ambulatory Visit: Payer: Self-pay | Admitting: Podiatry

## 2021-07-01 ENCOUNTER — Encounter: Payer: Self-pay | Admitting: Internal Medicine

## 2021-07-03 ENCOUNTER — Encounter: Payer: Self-pay | Admitting: Podiatry

## 2021-07-03 ENCOUNTER — Ambulatory Visit: Payer: Medicaid Other | Admitting: Podiatry

## 2021-07-03 ENCOUNTER — Ambulatory Visit (INDEPENDENT_AMBULATORY_CARE_PROVIDER_SITE_OTHER): Payer: Medicaid Other

## 2021-07-03 ENCOUNTER — Other Ambulatory Visit: Payer: Self-pay

## 2021-07-03 DIAGNOSIS — M7662 Achilles tendinitis, left leg: Secondary | ICD-10-CM

## 2021-07-03 DIAGNOSIS — M722 Plantar fascial fibromatosis: Secondary | ICD-10-CM | POA: Diagnosis not present

## 2021-07-03 MED ORDER — MELOXICAM 15 MG PO TABS: ORAL_TABLET | ORAL | 0 refills | Status: DC

## 2021-07-03 NOTE — Progress Notes (Signed)
?  Subjective:  ?Patient ID: Lisa Nash, female    DOB: 13-Feb-1971,   MRN: [id number] ? ?Chief Complaint  ?Patient presents with  ? Follow-up  ?   6wk fu PF/achillies pain;left foot/right foot pain underneath  ? ? ?51 y.o. female presents for follow-up of bilateral plantar fasciitis and left achilles tendonitis. Relates she is doing about the same. Has not been stretching because she never received the stretching exercises. She relates she has been wearing the boot which helps and taking meloxicam helps. The injections helped her plantar fasciitis.  Denies any other pedal complaints. Denies n/v/f/c.  ? ?Past Medical History:  ?Diagnosis Date  ? Arthritis   ? Asthma   ? Eczema   ? ? ?Objective:  ?Physical Exam: ?Vascular: DP/PT pulses 2/4 bilateral. CFT <3 seconds. Normal hair growth on digits. No edema.  ?Skin. No lacerations or abrasions bilateral feet.  ?Musculoskeletal: MMT 5/5 bilateral lower extremities in DF, PF, Inversion and Eversion. Deceased ROM in DF of ankle joint.  Mildly tender to medial calcaneal tubercle bilateral. No pain to PT bilateral Left ahcilles tender to watershed area. No pain at the insertion Pain with PF and DF.  ?Neurological: Sensation intact to light touch.  ? ?Assessment:  ? ?1. Tendonitis, Achilles, left   ?2. Plantar fasciitis of left foot   ?3. Plantar fasciitis of right foot   ? ? ? ? ?Plan:  ?Patient was evaluated and treated and all questions answered. ?Discussed plantar fasciitis with patient.  ?X-rays reviewed and discussed with patient. No acute fractures or dislocations noted. Mild spurring noted at inferior calcaneus.  ?Discussed treatment options including, ice, NSAIDS, supportive shoes, bracing, and stretching. Stretching exercises provided to be done on a daily basis.   ?-Xrays reviewed ?-Discussed Achilles insertional tendonitis and treatment options with patient.  ?-Discussed stretching exercises. ?Prescription for meloxicam provided and sent to pharmacy. ?No  injection needed today.    ?-Fitted for orthotics today.  ?Discussed if still has problems in 6 weeks than will try formal PT.  ?Follow-up 6 weeks or sooner if any problems arise. In the meantime, encouraged to call the office with any questions, concerns, change in symptoms.  ? ? ? ? ?Lorenda Peck, DPM  ? ? ?

## 2021-07-03 NOTE — Patient Instructions (Signed)
Achilles Tendinitis  with Rehab Achilles tendinitis is a disorder of the Achilles tendon. The Achilles tendon connects the large calf muscles (Gastrocnemius and Soleus) to the heel bone (calcaneus). This tendon is sometimes called the heel cord. It is important for pushing-off and standing on your toes and is important for walking, running, or jumping. Tendinitis is often caused by overuse and repetitive microtrauma. SYMPTOMS Pain, tenderness, swelling, warmth, and redness may occur over the Achilles tendon even at rest. Pain with pushing off, or flexing or extending the ankle. Pain that is worsened after or during activity. CAUSES  Overuse sometimes seen with rapid increase in exercise programs or in sports requiring running and jumping. Poor physical conditioning (strength and flexibility or endurance). Running sports, especially training running down hills. Inadequate warm-up before practice or play or failure to stretch before participation. Injury to the tendon. PREVENTION  Warm up and stretch before practice or competition. Allow time for adequate rest and recovery between practices and competition. Keep up conditioning. Keep up ankle and leg flexibility. Improve or keep muscle strength and endurance. Improve cardiovascular fitness. Use proper technique. Use proper equipment (shoes, skates). To help prevent recurrence, taping, protective strapping, or an adhesive bandage may be recommended for several weeks after healing is complete. PROGNOSIS  Recovery may take weeks to several months to heal. Longer recovery is expected if symptoms have been prolonged. Recovery is usually quicker if the inflammation is due to a direct blow as compared with overuse or sudden strain. RELATED COMPLICATIONS  Healing time will be prolonged if the condition is not correctly treated. The injury must be given plenty of time to heal. Symptoms can reoccur if activity is resumed too soon. Untreated,  tendinitis may increase the risk of tendon rupture requiring additional time for recovery and possibly surgery. TREATMENT  The first treatment consists of rest anti-inflammatory medication, and ice to relieve the pain. Stretching and strengthening exercises after resolution of pain will likely help reduce the risk of recurrence. Referral to a physical therapist or athletic trainer for further evaluation and treatment may be helpful. A walking boot or cast may be recommended to rest the Achilles tendon. This can help break the cycle of inflammation and microtrauma. Arch supports (orthotics) may be prescribed or recommended by your caregiver as an adjunct to therapy and rest. Surgery to remove the inflamed tendon lining or degenerated tendon tissue is rarely necessary and has shown less than predictable results. MEDICATION  Nonsteroidal anti-inflammatory medications, such as aspirin and ibuprofen, may be used for pain and inflammation relief. Do not take within 7 days before surgery. Take these as directed by your caregiver. Contact your caregiver immediately if any bleeding, stomach upset, or signs of allergic reaction occur. Other minor pain relievers, such as acetaminophen, may also be used. Pain relievers may be prescribed as necessary by your caregiver. Do not take prescription pain medication for longer than 4 to 7 days. Use only as directed and only as much as you need. Cortisone injections are rarely indicated. Cortisone injections may weaken tendons and predispose to rupture. It is better to give the condition more time to heal than to use them. HEAT AND COLD Cold is used to relieve pain and reduce inflammation for acute and chronic Achilles tendinitis. Cold should be applied for 10 to 15 minutes every 2 to 3 hours for inflammation and pain and immediately after any activity that aggravates your symptoms. Use ice packs or an ice massage. Heat may be used before performing stretching  and  strengthening activities prescribed by your caregiver. Use a heat pack or a warm soak. SEEK MEDICAL CARE IF: Symptoms get worse or do not improve in 2 weeks despite treatment. New, unexplained symptoms develop. Drugs used in treatment may produce side effects.  EXERCISES:  RANGE OF MOTION (ROM) AND STRETCHING EXERCISES - Achilles Tendinitis  These exercises may help you when beginning to rehabilitate your injury. Your symptoms may resolve with or without further involvement from your physician, physical therapist or athletic trainer. While completing these exercises, remember:  Restoring tissue flexibility helps normal motion to return to the joints. This allows healthier, less painful movement and activity. An effective stretch should be held for at least 30 seconds. A stretch should never be painful. You should only feel a gentle lengthening or release in the stretched tissue.  STRETCH  Gastroc, Standing  Place hands on wall. Extend right / left leg, keeping the front knee somewhat bent. Slightly point your toes inward on your back foot. Keeping your right / left heel on the floor and your knee straight, shift your weight toward the wall, not allowing your back to arch. You should feel a gentle stretch in the right / left calf. Hold this position for 10 seconds. Repeat 3 times. Complete this stretch 2 times per day.  STRETCH  Soleus, Standing  Place hands on wall. Extend right / left leg, keeping the other knee somewhat bent. Slightly point your toes inward on your back foot. Keep your right / left heel on the floor, bend your back knee, and slightly shift your weight over the back leg so that you feel a gentle stretch deep in your back calf. Hold this position for 10 seconds. Repeat 3 times. Complete this stretch 2 times per day.  STRETCH  Gastrocsoleus, Standing  Note: This exercise can place a lot of stress on your foot and ankle. Please complete this exercise only if specifically  instructed by your caregiver.  Place the ball of your right / left foot on a step, keeping your other foot firmly on the same step. Hold on to the wall or a rail for balance. Slowly lift your other foot, allowing your body weight to press your heel down over the edge of the step. You should feel a stretch in your right / left calf. Hold this position for 10 seconds. Repeat this exercise with a slight bend in your knee. Repeat 3 times. Complete this stretch 2 times per day.   STRENGTHENING EXERCISES - Achilles Tendinitis These exercises may help you when beginning to rehabilitate your injury. They may resolve your symptoms with or without further involvement from your physician, physical therapist or athletic trainer. While completing these exercises, remember:  Muscles can gain both the endurance and the strength needed for everyday activities through controlled exercises. Complete these exercises as instructed by your physician, physical therapist or athletic trainer. Progress the resistance and repetitions only as guided. You may experience muscle soreness or fatigue, but the pain or discomfort you are trying to eliminate should never worsen during these exercises. If this pain does worsen, stop and make certain you are following the directions exactly. If the pain is still present after adjustments, discontinue the exercise until you can discuss the trouble with your clinician.  STRENGTH - Plantar-flexors  Sit with your right / left leg extended. Holding onto both ends of a rubber exercise band/tubing, loop it around the ball of your foot. Keep a slight tension in the band. Slowly  push your toes away from you, pointing them downward. Hold this position for 10 seconds. Return slowly, controlling the tension in the band/tubing. Repeat 3 times. Complete this exercise 2 times per day.   STRENGTH - Plantar-flexors  Stand with your feet shoulder width apart. Steady yourself with a wall or table  using as little support as needed. Keeping your weight evenly spread over the width of your feet, rise up on your toes.* Hold this position for 10 seconds. Repeat 3 times. Complete this exercise 2 times per day.  *If this is too easy, shift your weight toward your right / left leg until you feel challenged. Ultimately, you may be asked to do this exercise with your right / left foot only.  STRENGTH  Plantar-flexors, Eccentric  Note: This exercise can place a lot of stress on your foot and ankle. Please complete this exercise only if specifically instructed by your caregiver.  Place the balls of your feet on a step. With your hands, use only enough support from a wall or rail to keep your balance. Keep your knees straight and rise up on your toes. Slowly shift your weight entirely to your right / left toes and pick up your opposite foot. Gently and with controlled movement, lower your weight through your right / left foot so that your heel drops below the level of the step. You will feel a slight stretch in the back of your calf at the end position. Use the healthy leg to help rise up onto the balls of both feet, then lower weight only on the right / left leg again. Build up to 15 repetitions. Then progress to 3 consecutive sets of 15 repetitions.* After completing the above exercise, complete the same exercise with a slight knee bend (about 30 degrees). Again, build up to 15 repetitions. Then progress to 3 consecutive sets of 15 repetitions.* Perform this exercise 2 times per day.  *When you easily complete 3 sets of 15, your physician, physical therapist or athletic trainer may advise you to add resistance by wearing a backpack filled with additional weight.  STRENGTH - Plantar Flexors, Seated  Sit on a chair that allows your feet to rest flat on the ground. If necessary, sit at the edge of the chair. Keeping your toes firmly on the ground, lift your right / left heel as far as you can without  increasing any discomfort in your ankle. Repeat 3 times. Complete this exercise 2 times a day.   Plantar Fasciitis (Heel Spur Syndrome) with Rehab The plantar fascia is a fibrous, ligament-like, soft-tissue structure that spans the bottom of the foot. Plantar fasciitis is a condition that causes pain in the foot due to inflammation of the tissue. SYMPTOMS  Pain and tenderness on the underneath side of the foot. Pain that worsens with standing or walking. CAUSES  Plantar fasciitis is caused by irritation and injury to the plantar fascia on the underneath side of the foot. Common mechanisms of injury include: Direct trauma to bottom of the foot. Damage to a small nerve that runs under the foot where the main fascia attaches to the heel bone. Stress placed on the plantar fascia due to bone spurs. RISK INCREASES WITH:  Activities that place stress on the plantar fascia (running, jumping, pivoting, or cutting). Poor strength and flexibility. Improperly fitted shoes. Tight calf muscles. Flat feet. Failure to warm-up properly before activity. Obesity. PREVENTION Warm up and stretch properly before activity. Allow for adequate recovery between workouts. Maintain  physical fitness: Strength, flexibility, and endurance. Cardiovascular fitness. Maintain a health body weight. Avoid stress on the plantar fascia. Wear properly fitted shoes, including arch supports for individuals who have flat feet.  PROGNOSIS  If treated properly, then the symptoms of plantar fasciitis usually resolve without surgery. However, occasionally surgery is necessary.  RELATED COMPLICATIONS  Recurrent symptoms that may result in a chronic condition. Problems of the lower back that are caused by compensating for the injury, such as limping. Pain or weakness of the foot during push-off following surgery. Chronic inflammation, scarring, and partial or complete fascia tear, occurring more often from repeated  injections.  TREATMENT  Treatment initially involves the use of ice and medication to help reduce pain and inflammation. The use of strengthening and stretching exercises may help reduce pain with activity, especially stretches of the Achilles tendon. These exercises may be performed at home or with a therapist. Your caregiver may recommend that you use heel cups of arch supports to help reduce stress on the plantar fascia. Occasionally, corticosteroid injections are given to reduce inflammation. If symptoms persist for greater than 6 months despite non-surgical (conservative), then surgery may be recommended.   MEDICATION  If pain medication is necessary, then nonsteroidal anti-inflammatory medications, such as aspirin and ibuprofen, or other minor pain relievers, such as acetaminophen, are often recommended. Do not take pain medication within 7 days before surgery. Prescription pain relievers may be given if deemed necessary by your caregiver. Use only as directed and only as much as you need. Corticosteroid injections may be given by your caregiver. These injections should be reserved for the most serious cases, because they may only be given a certain number of times.  HEAT AND COLD Cold treatment (icing) relieves pain and reduces inflammation. Cold treatment should be applied for 10 to 15 minutes every 2 to 3 hours for inflammation and pain and immediately after any activity that aggravates your symptoms. Use ice packs or massage the area with a piece of ice (ice massage). Heat treatment may be used prior to performing the stretching and strengthening activities prescribed by your caregiver, physical therapist, or athletic trainer. Use a heat pack or soak the injury in warm water.  SEEK IMMEDIATE MEDICAL CARE IF: Treatment seems to offer no benefit, or the condition worsens. Any medications produce adverse side effects.  EXERCISES- RANGE OF MOTION (ROM) AND STRETCHING EXERCISES - Plantar  Fasciitis (Heel Spur Syndrome) These exercises may help you when beginning to rehabilitate your injury. Your symptoms may resolve with or without further involvement from your physician, physical therapist or athletic trainer. While completing these exercises, remember:  Restoring tissue flexibility helps normal motion to return to the joints. This allows healthier, less painful movement and activity. An effective stretch should be held for at least 30 seconds. A stretch should never be painful. You should only feel a gentle lengthening or release in the stretched tissue.  RANGE OF MOTION - Toe Extension, Flexion Sit with your right / left leg crossed over your opposite knee. Grasp your toes and gently pull them back toward the top of your foot. You should feel a stretch on the bottom of your toes and/or foot. Hold this stretch for 10 seconds. Now, gently pull your toes toward the bottom of your foot. You should feel a stretch on the top of your toes and or foot. Hold this stretch for 10 seconds. Repeat  times. Complete this stretch 3 times per day.   RANGE OF MOTION - Ankle  Dorsiflexion, Active Assisted Remove shoes and sit on a chair that is preferably not on a carpeted surface. Place right / left foot under knee. Extend your opposite leg for support. Keeping your heel down, slide your right / left foot back toward the chair until you feel a stretch at your ankle or calf. If you do not feel a stretch, slide your bottom forward to the edge of the chair, while still keeping your heel down. Hold this stretch for 10 seconds. Repeat 3 times. Complete this stretch 2 times per day.   STRETCH  Gastroc, Standing Place hands on wall. Extend right / left leg, keeping the front knee somewhat bent. Slightly point your toes inward on your back foot. Keeping your right / left heel on the floor and your knee straight, shift your weight toward the wall, not allowing your back to arch. You should feel a  gentle stretch in the right / left calf. Hold this position for 10 seconds. Repeat 3 times. Complete this stretch 2 times per day.  STRETCH  Soleus, Standing Place hands on wall. Extend right / left leg, keeping the other knee somewhat bent. Slightly point your toes inward on your back foot. Keep your right / left heel on the floor, bend your back knee, and slightly shift your weight over the back leg so that you feel a gentle stretch deep in your back calf. Hold this position for 10 seconds. Repeat 3 times. Complete this stretch 2 times per day.  STRETCH  Gastrocsoleus, Standing  Note: This exercise can place a lot of stress on your foot and ankle. Please complete this exercise only if specifically instructed by your caregiver.  Place the ball of your right / left foot on a step, keeping your other foot firmly on the same step. Hold on to the wall or a rail for balance. Slowly lift your other foot, allowing your body weight to press your heel down over the edge of the step. You should feel a stretch in your right / left calf. Hold this position for 10 seconds. Repeat this exercise with a slight bend in your right / left knee. Repeat 3 times. Complete this stretch 2 times per day.   STRENGTHENING EXERCISES - Plantar Fasciitis (Heel Spur Syndrome)  These exercises may help you when beginning to rehabilitate your injury. They may resolve your symptoms with or without further involvement from your physician, physical therapist or athletic trainer. While completing these exercises, remember:  Muscles can gain both the endurance and the strength needed for everyday activities through controlled exercises. Complete these exercises as instructed by your physician, physical therapist or athletic trainer. Progress the resistance and repetitions only as guided.  STRENGTH - Towel Curls Sit in a chair positioned on a non-carpeted surface. Place your foot on a towel, keeping your heel on the  floor. Pull the towel toward your heel by only curling your toes. Keep your heel on the floor. Repeat 3 times. Complete this exercise 2 times per day.  STRENGTH - Ankle Inversion Secure one end of a rubber exercise band/tubing to a fixed object (table, pole). Loop the other end around your foot just before your toes. Place your fists between your knees. This will focus your strengthening at your ankle. Slowly, pull your big toe up and in, making sure the band/tubing is positioned to resist the entire motion. Hold this position for 10 seconds. Have your muscles resist the band/tubing as it slowly pulls your foot back to  the starting position. Repeat 3 times. Complete this exercises 2 times per day.  Document Released: 04/21/2005 Document Revised: 07/14/2011 Document Reviewed: 08/03/2008 [facility] Patient Information 2014 [place], [place].

## 2021-07-03 NOTE — Progress Notes (Signed)
SITUATION ?Reason for Consult: Evaluation for Bilateral Custom Foot Orthoses ?Patient / Caregiver Report: Patient is ready for foot orthotics ? ?OBJECTIVE DATA: ?Patient History / Diagnosis:  ?  ICD-10-CM   ?1. Plantar fasciitis of left foot  M72.2   ?  ?2. Plantar fasciitis of right foot  M72.2   ?  ?3. Tendonitis, Achilles, left  M76.62   ?  ? ? ?Current or Previous Devices: None and no history ? ?Foot Examination: ?Skin presentation:   Intact ?Ulcers & Callousing:   None and no history ?Toe / Foot Deformities:  None ?Weight Bearing Presentation:  Rectus ?Sensation:    Intact ? ?Shoe Size 8 ? ?ORTHOTIC RECOMMENDATION ?Recommended Device: 1x pair of custom functional foot orthotics ? ?GOALS OF ORTHOSES ?- Reduce Pain ?- Prevent Foot Deformity ?- Prevent Progression of Further Foot Deformity ?- Relieve Pressure ?- Improve the Overall Biomechanical Function of the Foot and Lower Extremity. ? ?ACTIONS PERFORMED ?Patient was casted for Foot Orthoses via crush box. Procedure was explained and patient tolerated procedure well. All questions were answered and concerns addressed. ? ?PLAN ?Potential out of pocket cost was communicated to patient. Casts are to be sent to [facility] for fabrication. Patient is to be called for fitting when devices are ready.  ? ? ?

## 2021-07-04 ENCOUNTER — Telehealth: Payer: Self-pay

## 2021-07-04 NOTE — Telephone Encounter (Signed)
Casts sent to central fabrication °

## 2021-07-10 ENCOUNTER — Encounter: Payer: Self-pay | Admitting: Internal Medicine

## 2021-07-22 ENCOUNTER — Telehealth: Payer: Self-pay

## 2021-07-22 NOTE — Telephone Encounter (Signed)
The pt was notified that her Health Statement Form is ready for pickup. ?

## 2021-08-14 ENCOUNTER — Ambulatory Visit: Payer: Medicaid Other | Admitting: Podiatry

## 2021-08-14 ENCOUNTER — Encounter: Payer: Self-pay | Admitting: Podiatry

## 2021-08-14 ENCOUNTER — Ambulatory Visit: Payer: Medicaid Other

## 2021-08-14 DIAGNOSIS — M7662 Achilles tendinitis, left leg: Secondary | ICD-10-CM

## 2021-08-14 DIAGNOSIS — M722 Plantar fascial fibromatosis: Secondary | ICD-10-CM | POA: Diagnosis not present

## 2021-08-14 MED ORDER — MELOXICAM 15 MG PO TABS: ORAL_TABLET | ORAL | 0 refills | Status: DC

## 2021-08-14 NOTE — Progress Notes (Signed)
?  Subjective:  ?Patient ID: Lisa Nash, female    DOB: 1971/03/28,   MRN: [id number] ? ?Chief Complaint  ?Patient presents with  ? Follow-up  ?  Tendonitis, Achilles, left   ?Plantar fasciitis of left foot  ?Plantar fasciitis of right foot  ? ?  ? ? ?51 y.o. female presents for follow-up of bilateral plantar fasciitis and left achilles tendonitis. Relates she is doing better. Relates she has been stretching and the meloxicam has been helping. Relates she is about 70% better.  Denies any other pedal complaints. Denies n/v/f/c.  ? ?Past Medical History:  ?Diagnosis Date  ? Arthritis   ? Asthma   ? Eczema   ? ? ?Objective:  ?Physical Exam: ?Vascular: DP/PT pulses 2/4 bilateral. CFT <3 seconds. Normal hair growth on digits. No edema.  ?Skin. No lacerations or abrasions bilateral feet.  ?Musculoskeletal: MMT 5/5 bilateral lower extremities in DF, PF, Inversion and Eversion. Deceased ROM in DF of ankle joint.  Mildly tender to medial calcaneal tubercle bilateral. No pain to PT bilateral Left ahcilles tender to watershed area but improved.  No pain at the insertion. No pain with PF and DF.  ?Neurological: Sensation intact to light touch.  ? ?Assessment:  ? ?1. Plantar fasciitis of left foot   ?2. Tendonitis, Achilles, left   ?3. Plantar fasciitis of right foot   ? ? ? ? ? ?Plan:  ?Patient was evaluated and treated and all questions answered. ?Discussed plantar fasciitis with patient.  ?X-rays reviewed and discussed with patient. No acute fractures or dislocations noted. Mild spurring noted at inferior calcaneus.  ?Discussed treatment options including, ice, NSAIDS, supportive shoes, bracing, and stretching. Stretching exercises provided to be done on a daily basis.   ?-Xrays reviewed ?-Discussed Achilles insertional tendonitis and treatment options with patient.  ?Continue stretching.  ?Prescription for meloxicam refilled.  ?No injection needed today.    ?-Patient will be notified when orthotics are in.  ?Discussed  if still has problems  than will try formal PT.  ?Follow-up as needed ? ? ? ? ?Lorenda Peck, DPM  ? ? ?

## 2021-08-14 NOTE — Progress Notes (Signed)
SITUATION: ?Reason for Visit: Fitting and Delivery of Custom Fabricated Foot Orthoses ?Patient Report: Patient reports comfort and is satisfied with device. ? ?OBJECTIVE DATA: ?Patient History / Diagnosis:   ?  ICD-10-CM   ?1. Plantar fasciitis of left foot  M72.2   ?  ?2. Tendonitis, Achilles, left  M76.62   ?  ?3. Plantar fasciitis of right foot  M72.2   ?  ? ? ?Provided Device:  Custom Functional Foot Orthotics ?    [employer]: [id number] ? ?GOAL OF ORTHOSIS ?- Improve gait ?- Decrease energy expenditure ?- Improve Balance ?- Provide Triplanar stability of foot complex ?- Facilitate motion ? ?ACTIONS PERFORMED ?Patient was fit with foot orthotics trimmed to shoe last. Patient tolerated fittign procedure.  ? ?Patient was provided with verbal and written instruction and demonstration regarding donning, doffing, wear, care, proper fit, function, purpose, cleaning, and use of the orthosis and in all related precautions and risks and benefits regarding the orthosis. ? ?Patient was also provided with verbal instruction regarding how to report any failures or malfunctions of the orthosis and necessary follow up care. Patient was also instructed to contact our office regarding any change in status that may affect the function of the orthosis. ? ?Patient demonstrated independence with proper donning, doffing, and fit and verbalized understanding of all instructions. ? ?PLAN: ?Patient is to follow up in one week or as necessary (PRN). All questions were answered and concerns addressed. Plan of care was discussed with and agreed upon by the patient. ? ?

## 2021-09-05 ENCOUNTER — Other Ambulatory Visit: Payer: Self-pay | Admitting: Internal Medicine

## 2021-09-05 DIAGNOSIS — Z1231 Encounter for screening mammogram for malignant neoplasm of breast: Secondary | ICD-10-CM

## 2021-09-09 ENCOUNTER — Ambulatory Visit: Payer: Medicaid Other

## 2021-09-10 ENCOUNTER — Ambulatory Visit
Admission: RE | Admit: 2021-09-10 | Discharge: 2021-09-10 | Disposition: A | Discharge status: Home / Self Care | Payer: Medicaid Other | Source: Ambulatory Visit | Attending: Internal Medicine | Admitting: Internal Medicine

## 2021-09-10 DIAGNOSIS — Z1231 Encounter for screening mammogram for malignant neoplasm of breast: Secondary | ICD-10-CM | POA: Diagnosis not present

## 2021-10-04 ENCOUNTER — Telehealth: Payer: Self-pay

## 2021-10-04 NOTE — Telephone Encounter (Signed)
Spoke with pt and she stated that she is having issues with her orthotics and she would like to come in to be seen. Pt does not have her work schedule for next week yet so she will be calling back and we will get her worked in wherever is most convenient for her to accommodate her schedule

## 2021-10-08 ENCOUNTER — Ambulatory Visit: Payer: Medicaid Other

## 2021-10-08 DIAGNOSIS — M722 Plantar fascial fibromatosis: Secondary | ICD-10-CM

## 2021-10-08 NOTE — Progress Notes (Signed)
SITUATION Reason for Consult: Follow-up with foot orthotics Patient / Caregiver Report: Patient reports pain after the 2 hr mark of wear time  OBJECTIVE DATA History / Diagnosis:    ICD-10-CM   1. Plantar fasciitis of left foot  M72.2       Change in Pathology: None  ACTIONS PERFORMED Patient's equipment was checked for structural stability and fit. Adjusted heel cant to reduce pressure in arch Device(s) intact and fit is excellent. All questions answered and concerns addressed.  PLAN Follow-up as needed (PRN). Plan of care discussed with and agreed upon by patient / caregiver.

## 2021-10-22 ENCOUNTER — Encounter: Payer: Self-pay | Admitting: Internal Medicine

## 2021-10-22 ENCOUNTER — Ambulatory Visit (INDEPENDENT_AMBULATORY_CARE_PROVIDER_SITE_OTHER): Payer: Medicaid Other | Admitting: Internal Medicine

## 2021-10-22 VITALS — BP 122/78 | HR 83 | Temp 97.7°F | Ht 63.0 in | Wt 196.8 lb

## 2021-10-22 DIAGNOSIS — Z716 Tobacco abuse counseling: Secondary | ICD-10-CM

## 2021-10-22 DIAGNOSIS — Z6834 Body mass index (BMI) 34.0-34.9, adult: Secondary | ICD-10-CM

## 2021-10-22 DIAGNOSIS — J4520 Mild intermittent asthma, uncomplicated: Secondary | ICD-10-CM | POA: Diagnosis not present

## 2021-10-22 DIAGNOSIS — E6609 Other obesity due to excess calories: Secondary | ICD-10-CM

## 2021-10-22 DIAGNOSIS — I10 Essential (primary) hypertension: Secondary | ICD-10-CM

## 2021-10-22 MED ORDER — HYDROCHLOROTHIAZIDE 12.5 MG PO CAPS: ORAL_CAPSULE | ORAL | 1 refills | Status: DC

## 2021-10-22 MED ORDER — MONTELUKAST SODIUM 10 MG PO TABS: ORAL_TABLET | ORAL | 3 refills | Status: DC

## 2021-10-22 NOTE — Progress Notes (Signed)
Rich Brave [place],acting as a Education administrator for [name], MD.,have documented all relevant documentation on the behalf of [name], MD,as directed by  [name], MD while in the presence of [name], MD.  This visit occurred during the SARS-CoV-2 public health emergency.  Safety protocols were in place, including screening questions prior to the visit, additional usage of staff PPE, and extensive cleaning of exam room while observing appropriate contact time as indicated for disinfecting solutions.  Subjective:     Patient ID: Lisa Nash , female    DOB: [birth year] , 51 y.o.   MRN: [id number]   Chief Complaint  Patient presents with   Hypertension    HPI  She is here today for blood pressure check. She reports compliance with meds. Patient does not have any questions or concerns at this time. She denies headaches, chest pain and shortness of breath.   Hypertension This is a chronic problem. The current episode started more than 1 year ago. The problem has been gradually improving since onset. The problem is controlled. Pertinent negatives include no blurred vision, chest pain, palpitations or shortness of breath. The current treatment provides moderate improvement. Compliance problems include exercise.      Past Medical History:  Diagnosis Date   Arthritis    Asthma    Eczema      Family History  Problem Relation Age of Onset   Hypertension Mother    Hypertension Father    Arthritis Father    Breast cancer Neg Hx      Current Outpatient Medications:    clobetasol (TEMOVATE) 0.05 % external solution, Apply topically., Disp: , Rfl:    clobetasol ointment (TEMOVATE) 0.05 %, Apply to scalp 3-4 times per week. Not to face., Disp: , Rfl:    EPINEPHrine 0.3 mg/0.3 mL IJ SOAJ injection, Inject 0.3 mg into the muscle as needed for anaphylaxis., Disp: 1 each, Rfl: 0   meloxicam (MOBIC) 15 MG tablet, TAKE 1 TABLET(15 MG) BY MOUTH DAILY, Disp: 30 tablet,  Rfl: 0   minoxidil (LONITEN) 2.5 MG tablet, Take 1/2 tab by mouth daily, Disp: , Rfl:    albuterol (VENTOLIN HFA) 108 (90 Base) MCG/ACT inhaler, Inhale 2 puffs into the lungs every 6 (six) hours as needed for wheezing or shortness of breath., Disp: 18 g, Rfl: 1   hydrochlorothiazide (MICROZIDE) 12.5 MG capsule, TAKE 1 CAPSULE(12.5 MG) BY MOUTH DAILY, Disp: 90 capsule, Rfl: 1   montelukast (SINGULAIR) 10 MG tablet, TAKE 1 TABLET BY MOUTH EVERY DAY IN THE EVENING, Disp: 90 tablet, Rfl: 3   Allergies  Allergen Reactions   Bee Pollen Anaphylaxis   Adhesive [Tape]    Pollen Extract      Review of Systems  Constitutional: Negative.   Eyes:  Negative for blurred vision.  Respiratory: Negative.  Negative for shortness of breath.   Cardiovascular: Negative.  Negative for chest pain and palpitations.  Neurological: Negative.   Psychiatric/Behavioral: Negative.       Today's Vitals   10/22/21 1143  BP: 122/78  Pulse: 83  Temp: 97.7 F (36.5 C)  Weight: 196 lb 12.8 oz (89.3 kg)  Height: _0  (1.6 m)  PainSc: 0-No pain   Body mass index is 34.86 kg/m.  Wt Readings from Last 3 Encounters:  10/22/21 196 lb 12.8 oz (89.3 kg)  06/19/21 194 lb 6.4 oz (88.2 kg)  04/16/21 199 lb (90.3 kg)     Objective:  Physical Exam Vitals and nursing  note reviewed.  Constitutional:      Appearance: Normal appearance.  HENT:     Head: Normocephalic and atraumatic.  Eyes:     Extraocular Movements: Extraocular movements intact.  Cardiovascular:     Rate and Rhythm: Normal rate and regular rhythm.     Heart sounds: Normal heart sounds.  Pulmonary:     Effort: Pulmonary effort is normal.     Breath sounds: Normal breath sounds.  Musculoskeletal:     Cervical back: Normal range of motion.  Skin:    General: Skin is warm.  Neurological:     General: No focal deficit present.     Mental Status: She is alert.  Psychiatric:        Mood and Affect: Mood normal.        Behavior: Behavior normal.       Assessment And Plan:     1. Essential hypertension, benign Comments: Chronic, well controlled. No med changes. She is encouraged to follow low sodium diet.  I will check renal function today. She will f/u 6 months.  - CMP14+EGFR  2. Mild intermittent asthma without complication Comments: Chronic, I will send rx montelukast 72m daily.  - montelukast (SINGULAIR) 10 MG tablet; TAKE 1 TABLET BY MOUTH EVERY DAY IN THE EVENING  Dispense: 90 tablet; Refill: 3  3. Class 1 obesity due to excess calories with serious comorbidity and body mass index (BMI) of 34.0 to 34.9 in adult Comments: She is encouraged to strive for BMI<30 while aiming for at least 150 minutes of exercise per week.   4. Encounter for tobacco use cessation counseling Smoking cessation instruction/counseling given:  counseled patient on the dangers of tobacco use, advised patient to stop smoking, and reviewed strategies to maximize success.   Patient was given opportunity to ask questions. Patient verbalized understanding of the plan and was able to repeat key elements of the plan. All questions were answered to their satisfaction.   I, RMaximino Greenland MD, have reviewed all documentation for this visit. The documentation on 10/30/21 for the exam, diagnosis, procedures, and orders are all accurate and complete.   IF YOU HAVE BEEN REFERRED TO A SPECIALIST, IT MAY TAKE 1-2 WEEKS TO SCHEDULE/PROCESS THE REFERRAL. IF YOU HAVE NOT HEARD FROM US/SPECIALIST IN TWO WEEKS, PLEASE GIVE [facility] CALL AT [phone].   THE PATIENT IS ENCOURAGED TO PRACTICE SOCIAL DISTANCING DUE TO THE COVID-19 PANDEMIC.

## 2021-10-22 NOTE — Patient Instructions (Signed)
Hypertension, Adult ?Hypertension is another name for high blood pressure. High blood pressure forces your heart to work harder to pump blood. This can cause problems over time. ?There are two numbers in a blood pressure reading. There is a top number (systolic) over a bottom number (diastolic). It is best to have a blood pressure that is below 120/80. ?What are the causes? ?The cause of this condition is not known. Some other conditions can lead to high blood pressure. ?What increases the risk? ?Some lifestyle factors can make you more likely to develop high blood pressure: ?Smoking. ?Not getting enough exercise or physical activity. ?Being overweight. ?Having too much fat, sugar, calories, or salt (sodium) in your diet. ?Drinking too much alcohol. ?Other risk factors include: ?Having any of these conditions: ?Heart disease. ?Diabetes. ?High cholesterol. ?Kidney disease. ?Obstructive sleep apnea. ?Having a family history of high blood pressure and high cholesterol. ?Age. The risk increases with age. ?Stress. ?What are the signs or symptoms? ?High blood pressure may not cause symptoms. Very high blood pressure (hypertensive crisis) may cause: ?Headache. ?Fast or uneven heartbeats (palpitations). ?Shortness of breath. ?Nosebleed. ?Vomiting or feeling like you may vomit (nauseous). ?Changes in how you see. ?Very bad chest pain. ?Feeling dizzy. ?Seizures. ?How is this treated? ?This condition is treated by making healthy lifestyle changes, such as: ?Eating healthy foods. ?Exercising more. ?Drinking less alcohol. ?Your doctor may prescribe medicine if lifestyle changes do not help enough and if: ?Your top number is above 130. ?Your bottom number is above 80. ?Your personal target blood pressure may vary. ?Follow these instructions at home: ?Eating and drinking ? ?If told, follow the DASH eating plan. To follow this plan: ?Fill one half of your plate at each meal with fruits and vegetables. ?Fill one fourth of your plate  at each meal with whole grains. Whole grains include whole-wheat pasta, brown rice, and whole-grain bread. ?Eat or drink low-fat dairy products, such as skim milk or low-fat yogurt. ?Fill one fourth of your plate at each meal with low-fat (lean) proteins. Low-fat proteins include fish, chicken without skin, eggs, beans, and tofu. ?Avoid fatty meat, cured and processed meat, or chicken with skin. ?Avoid pre-made or processed food. ?Limit the amount of salt in your diet to less than 1,500 mg each day. ?Do not drink alcohol if: ?Your doctor tells you not to drink. ?You are pregnant, may be pregnant, or are planning to become pregnant. ?If you drink alcohol: ?Limit how much you have to: ?0-1 drink a day for women. ?0-2 drinks a day for men. ?Know how much alcohol is in your drink. In the [place] one drink equals one 12 oz bottle of beer (355 mL), one 5 oz glass of wine (148 mL), or one 1? oz glass of hard liquor (44 mL). ?Lifestyle ? ?Work with your doctor to stay at a healthy weight or to lose weight. Ask your doctor what the best weight is for you. ?Get at least 30 minutes of exercise that causes your heart to beat faster (aerobic exercise) most days of the week. This may include walking, swimming, or biking. ?Get at least 30 minutes of exercise that strengthens your muscles (resistance exercise) at least 3 days a week. This may include lifting weights or doing Pilates. ?Do not smoke or use any products that contain nicotine or tobacco. If you need help quitting, ask your doctor. ?Check your blood pressure at home as told by your doctor. ?Keep all follow-up visits. ?Medicines ?Take over-the-counter and prescription medicines   only as told by your doctor. Follow directions carefully. ?Do not skip doses of blood pressure medicine. The medicine does not work as well if you skip doses. Skipping doses also puts you at risk for problems. ?Ask your doctor about side effects or reactions to medicines that you should watch  for. ?Contact a doctor if: ?You think you are having a reaction to the medicine you are taking. ?You have headaches that keep coming back. ?You feel dizzy. ?You have swelling in your ankles. ?You have trouble with your vision. ?Get help right away if: ?You get a very bad headache. ?You start to feel mixed up (confused). ?You feel weak or numb. ?You feel faint. ?You have very bad pain in your: ?Chest. ?Belly (abdomen). ?You vomit more than once. ?You have trouble breathing. ?These symptoms may be an emergency. Get help right away. Call 911. ?Do not wait to see if the symptoms will go away. ?Do not drive yourself to the hospital. ?Summary ?Hypertension is another name for high blood pressure. ?High blood pressure forces your heart to work harder to pump blood. ?For most people, a normal blood pressure is less than 120/80. ?Making healthy choices can help lower blood pressure. If your blood pressure does not get lower with healthy choices, you may need to take medicine. ?This information is not intended to replace advice given to you by your health care provider. Make sure you discuss any questions you have with your health care provider. ?Document Revised: 02/07/2021 Document Reviewed: 02/07/2021 ?Elsevier Patient Education ? 2023 Elsevier Inc. ? ?

## 2021-10-23 LAB — CMP14+EGFR
ALT: 17 IU/L (ref 0–32)
AST: 18 IU/L (ref 0–40)
Albumin/Globulin Ratio: 1.3 (ref 1.2–2.2)
Albumin: 4.3 g/dL (ref 3.8–4.8)
Alkaline Phosphatase: 85 IU/L (ref 44–121)
BUN/Creatinine Ratio: 20 (ref 9–23)
BUN: 15 mg/dL (ref 6–24)
Bilirubin Total: 0.4 mg/dL (ref 0.0–1.2)
CO2: 25 mmol/L (ref 20–29)
Calcium: 9.6 mg/dL (ref 8.7–10.2)
Chloride: 103 mmol/L (ref 96–106)
Creatinine, Ser: 0.74 mg/dL (ref 0.57–1.00)
Globulin, Total: 3.2 g/dL (ref 1.5–4.5)
Glucose: 81 mg/dL (ref 70–99)
Potassium: 4.1 mmol/L (ref 3.5–5.2)
Sodium: 144 mmol/L (ref 134–144)
Total Protein: 7.5 g/dL (ref 6.0–8.5)
eGFR: 99 mL/min/{1.73_m2} (ref >59)

## 2021-10-24 ENCOUNTER — Other Ambulatory Visit: Payer: Self-pay

## 2021-10-24 MED ORDER — ALBUTEROL SULFATE HFA 108 (90 BASE) MCG/ACT IN AERS: 2.0000 | INHALATION_SPRAY | Freq: Four times a day (QID) | RESPIRATORY_TRACT | 1 refills | Status: DC | PRN

## 2021-11-07 ENCOUNTER — Telehealth: Payer: Medicaid Other | Admitting: Family Medicine

## 2021-11-07 DIAGNOSIS — J4521 Mild intermittent asthma with (acute) exacerbation: Secondary | ICD-10-CM

## 2021-11-07 DIAGNOSIS — J208 Acute bronchitis due to other specified organisms: Secondary | ICD-10-CM

## 2021-11-07 MED ORDER — PREDNISONE 20 MG PO TABS: 40.0000 mg | ORAL_TABLET | Freq: Every day | ORAL | 0 refills | Status: AC

## 2021-11-07 MED ORDER — PROMETHAZINE-DM 6.25-15 MG/5ML PO SYRP: 5.0000 mL | ORAL_SOLUTION | Freq: Three times a day (TID) | ORAL | 0 refills | Status: DC | PRN

## 2021-11-07 MED ORDER — BENZONATATE 100 MG PO CAPS: 100.0000 mg | ORAL_CAPSULE | Freq: Two times a day (BID) | ORAL | 0 refills | Status: DC | PRN

## 2021-11-07 NOTE — Progress Notes (Signed)
Virtual Visit Consent   [place], you are scheduled for a virtual visit with a [facility] provider today. Just as with appointments in the office, your consent must be obtained to participate. Your consent will be active for this visit and any virtual visit you may have with one of our providers in the next 365 days. If you have a MyChart account, a copy of this consent can be sent to you electronically.  As this is a virtual visit, video technology does not allow for your provider to perform a traditional examination. This may limit your provider's ability to fully assess your condition. If your provider identifies any concerns that need to be evaluated in person or the need to arrange testing (such as labs, EKG, etc.), we will make arrangements to do so. Although advances in technology are sophisticated, we cannot ensure that it will always work on either your end or our end. If the connection with a video visit is poor, the visit may have to be switched to a telephone visit. With either a video or telephone visit, we are not always able to ensure that we have a secure connection.  By engaging in this virtual visit, you consent to the provision of healthcare and authorize for your insurance to be billed (if applicable) for the services provided during this visit. Depending on your insurance coverage, you may receive a charge related to this service.  I need to obtain your verbal consent now. Are you willing to proceed with your visit today? Lisa Nash has provided verbal consent on 11/07/2021 for a virtual visit (video or telephone). Perlie Mayo, NP  Date: 11/07/2021 8:30 AM  Virtual Visit via Video Note   I, Perlie Mayo, connected with  [name]  ([id number], 1970/11/12) on 11/07/21 at  8:30 AM EDT by a video-enabled telemedicine application and verified that I am speaking with the correct person using two identifiers.  Location: Patient: Virtual Visit Location  Patient: Home Provider: Virtual Visit Location Provider: Home Office   I discussed the limitations of evaluation and management by telemedicine and the availability of in person appointments. The patient expressed understanding and agreed to proceed.    History of Present Illness: Lisa Nash is a 50 y.o. who identifies as a female who was assigned female at birth, and is being seen today for cough   HPI: Cough This is a new (does have asthma history) problem. The current episode started 1 to 4 weeks ago. The problem has been rapidly worsening. The problem occurs constantly. The cough is Non-productive. Associated symptoms include postnasal drip, rhinorrhea and shortness of breath. Pertinent negatives include no chills, ear congestion, ear pain, fever, headaches, heartburn, hemoptysis, myalgias, nasal congestion, rash, sore throat, sweats, weight loss or wheezing. Associated symptoms comments: Chest pain due to coughing, not with breathing of resting. Exacerbated by: allergies. She has tried a beta-agonist inhaler and rest for the symptoms. The treatment provided mild relief. Her past medical history is significant for asthma.    Problems:  Patient Active Problem List   Diagnosis Date Noted   Traction alopecia 02/07/2021   Grief at loss of child 11/16/2019   Adjustment disorder with depressed mood 11/16/2019   Hair loss 11/16/2019   Essential hypertension, benign 11/16/2019   Fibroids 05/24/2019   Hypertension 05/24/2019   Obesity, Class I, BMI 30-34.9 05/24/2019   SUI (stress urinary incontinence, female) 05/24/2019   Closed displaced fracture of proximal phalanx of left little  finger 10/27/2018   Bruising 03/25/2014   Eczema     Allergies:  Allergies  Allergen Reactions   Bee Pollen Anaphylaxis   Adhesive [Tape]    Pollen Extract    Medications:  Current Outpatient Medications:    albuterol (VENTOLIN HFA) 108 (90 Base) MCG/ACT inhaler, Inhale 2 puffs into the lungs  every 6 (six) hours as needed for wheezing or shortness of breath., Disp: 18 g, Rfl: 1   clobetasol (TEMOVATE) 0.05 % external solution, Apply topically., Disp: , Rfl:    clobetasol ointment (TEMOVATE) 0.05 %, Apply to scalp 3-4 times per week. Not to face., Disp: , Rfl:    EPINEPHrine 0.3 mg/0.3 mL IJ SOAJ injection, Inject 0.3 mg into the muscle as needed for anaphylaxis., Disp: 1 each, Rfl: 0   hydrochlorothiazide (MICROZIDE) 12.5 MG capsule, TAKE 1 CAPSULE(12.5 MG) BY MOUTH DAILY, Disp: 90 capsule, Rfl: 1   meloxicam (MOBIC) 15 MG tablet, TAKE 1 TABLET(15 MG) BY MOUTH DAILY, Disp: 30 tablet, Rfl: 0   minoxidil (LONITEN) 2.5 MG tablet, Take 1/2 tab by mouth daily, Disp: , Rfl:    montelukast (SINGULAIR) 10 MG tablet, TAKE 1 TABLET BY MOUTH EVERY DAY IN THE EVENING, Disp: 90 tablet, Rfl: 3  Observations/Objective: Patient is well-developed, well-nourished in no acute distress.  Resting comfortably  at home.  Head is normocephalic, atraumatic.  Cough present, mild shortness of breath post coughing spell Speech is clear and coherent with logical content.  Patient is alert and oriented at baseline.    Assessment and Plan: 1. Mild intermittent asthma with acute exacerbation  - predniSONE (DELTASONE) 20 MG tablet; Take 2 tablets (40 mg total) by mouth daily with breakfast for 5 days.  Dispense: 10 tablet; Refill: 0 - promethazine-dextromethorphan (PROMETHAZINE-DM) 6.25-15 MG/5ML syrup; Take 5 mLs by mouth 3 (three) times daily as needed for cough.  Dispense: 118 mL; Refill: 0 - benzonatate (TESSALON) 100 MG capsule; Take 1 capsule (100 mg total) by mouth 2 (two) times daily as needed for cough.  Dispense: 20 capsule; Refill: 0  2. Viral bronchitis  - predniSONE (DELTASONE) 20 MG tablet; Take 2 tablets (40 mg total) by mouth daily with breakfast for 5 days.  Dispense: 10 tablet; Refill: 0 - promethazine-dextromethorphan (PROMETHAZINE-DM) 6.25-15 MG/5ML syrup; Take 5 mLs by mouth 3 (three)  times daily as needed for cough.  Dispense: 118 mL; Refill: 0 - benzonatate (TESSALON) 100 MG capsule; Take 1 capsule (100 mg total) by mouth 2 (two) times daily as needed for cough.  Dispense: 20 capsule; Refill: 0  -asthma started giving trouble a few weeks back- was seen at PCP -inhaler and singular provided taking as directed -reports cough is worsening- dry- viral related flare -will treat with above meds  -strict in person precautions reviewed -mucinex encouraged if mucus starts to pick up (advised no DM since she has cough med with that in it) -rest -hydrate -work note for today provided    Reviewed side effects, risks and benefits of medication.    Patient acknowledged agreement and understanding of the plan.   Past Medical, Surgical, Social History, Allergies, and Medications have been Reviewed.   Follow Up Instructions: I discussed the assessment and treatment plan with the patient. The patient was provided an opportunity to ask questions and all were answered. The patient agreed with the plan and demonstrated an understanding of the instructions.  A copy of instructions were sent to the patient via MyChart unless otherwise noted below.   The patient was  advised to call back or seek an in-person evaluation if the symptoms worsen or if the condition fails to improve as anticipated.  Time:  I spent 15 minutes with the patient via telehealth technology discussing the above problems/concerns.    Perlie Mayo, NP

## 2021-11-07 NOTE — Patient Instructions (Signed)
Asthma, Adult  Asthma is a condition that causes swelling and narrowing of the airways. These are the passages that lead from the nose and mouth down into the lungs. When asthma symptoms get worse it is called an asthma attack or flare. This can make it hard to breathe. Asthma flares can range from minor to life-threatening. There is no cure for asthma, but medicines and lifestyle changes can help to control it. What are the causes? It is not known exactly what causes asthma, but certain things can cause asthma symptoms to get worse (triggers). What can trigger an asthma attack? Cigarette smoke. Mold. Dust. Your pet's skin flakes (dander). Cockroaches. Pollen. Air pollution (like household cleaners, wood smoke, smog, or chemical odors). What are the signs or symptoms? Trouble breathing (shortness of breath). Coughing. Making high-pitched whistling sounds when you breathe, most often when you breathe out (wheezing). Chest tightness. Tiredness with little activity. Poor exercise tolerance. How is this treated? Controller medicines that help prevent asthma symptoms. Fast-acting reliever or rescue medicines. These give short-term relief of asthma symptoms. Allergy medicines if your attacks are brought on by allergens. Medicines to help control the body's defense (immune) system. Staying away from the things that cause asthma attacks. Follow these instructions at home: Avoiding triggers in your home Do not allow anyone to smoke in your home. Limit use of fireplaces and wood stoves. Get rid of pests (such as roaches and mice) and their droppings. Keep your home clean. Clean your floors. Dust regularly. Use cleaning products that do not smell. Wash bed sheets and blankets every week in hot water. Dry them in a dryer. Have someone vacuum when you are not home. Change your heating and air conditioning filters often. Use blankets that are made of polyester or cotton. General  instructions Take over-the-counter and prescription medicines only as told by your doctor. Do not smoke or use any products that contain nicotine or tobacco. If you need help quitting, ask your doctor. Stay away from secondhand smoke. Avoid doing things outdoors when allergen counts are high and when air quality is low. Warm up before you exercise. Take time to cool down after exercise. Use a peak flow meter as told by your doctor. A peak flow meter is a tool that measures how well your lungs are working. Keep track of the peak flow meter's readings. Write them down. Follow your asthma action plan. This is a written plan for taking care of your asthma and treating your attacks. Make sure you get all the shots (vaccines) that your doctor recommends. Ask your doctor about a flu shot and a pneumonia shot. Keep all follow-up visits. Contact a doctor if: You have wheezing, shortness of breath, or a cough even while taking medicine to prevent attacks. The mucus you cough up (sputum) is thicker than usual. The mucus you cough up changes from clear or white to yellow, green, gray, or is bloody. You have problems from the medicine you are taking, such as: A rash. Itching. Swelling. Trouble breathing. You need reliever medicines more than 2-3 times a week. Your peak flow reading is still at 50-79% of your personal best after following the action plan for 1 hour. You have a fever. Get help right away if: You seem to be worse and are not responding to medicine during an asthma attack. You are short of breath even at rest. You get short of breath when doing very little activity. You have trouble eating, drinking, or talking. You have chest   pain or tightness. You have a fast heartbeat. Your lips or fingernails start to turn blue. You are light-headed or dizzy, or you faint. Your peak flow is less than 50% of your personal best. You feel too tired to breathe normally. These symptoms may be an  emergency. Get help right away. Call 911. Do not wait to see if the symptoms will go away. Do not drive yourself to the hospital. Summary Asthma is a long-term (chronic) condition in which the airways get tight and narrow. An asthma attack can make it hard to breathe. Asthma cannot be cured, but medicines and lifestyle changes can help control it. Make sure you understand how to avoid triggers and how and when to use your medicines. Avoid things that can cause allergy symptoms (allergens). These include animal skin flakes (dander) and pollen from trees or grass. Avoid things that pollute the air. These may include household cleaners, wood smoke, smog, or chemical odors. This information is not intended to replace advice given to you by your health care provider. Make sure you discuss any questions you have with your health care provider. Document Revised: 01/28/2021 Document Reviewed: 01/28/2021 Elsevier Patient Education  2023 [employer]  

## 2021-11-14 DIAGNOSIS — H5213 Myopia, bilateral: Secondary | ICD-10-CM | POA: Diagnosis not present

## 2021-12-04 DIAGNOSIS — H524 Presbyopia: Secondary | ICD-10-CM | POA: Diagnosis not present

## 2021-12-16 IMAGING — MG MM DIGITAL SCREENING BILAT W/ TOMO AND CAD
6 series · 6 of 14 positions shown · non-contrast
Comparison: Previous exam(s).

CLINICAL DATA: Screening.

EXAM:
DIGITAL SCREENING BILATERAL MAMMOGRAM WITH TOMOSYNTHESIS AND CAD
TECHNIQUE: Bilateral screening digital craniocaudal and mediolateral oblique
mammograms were obtained. Bilateral screening digital breast
tomosynthesis was performed. The images were evaluated with
computer-aided detection.

[L MLO synth-2D]
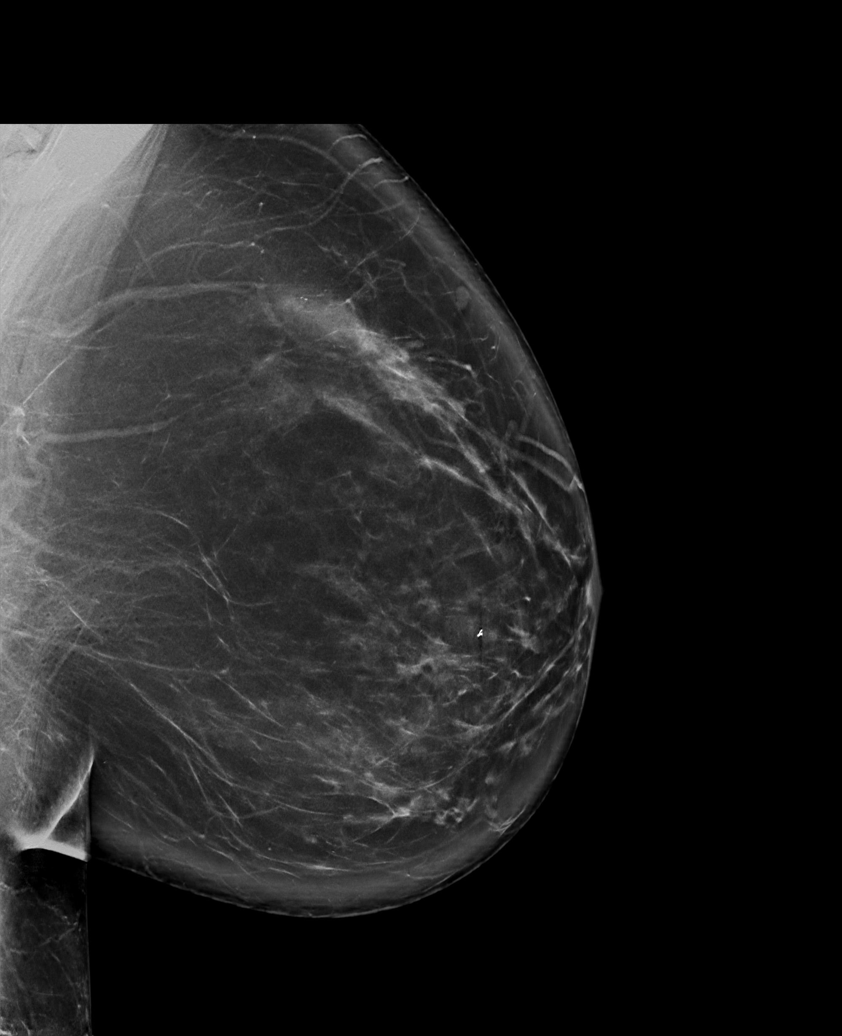

[R CC synth-2D]
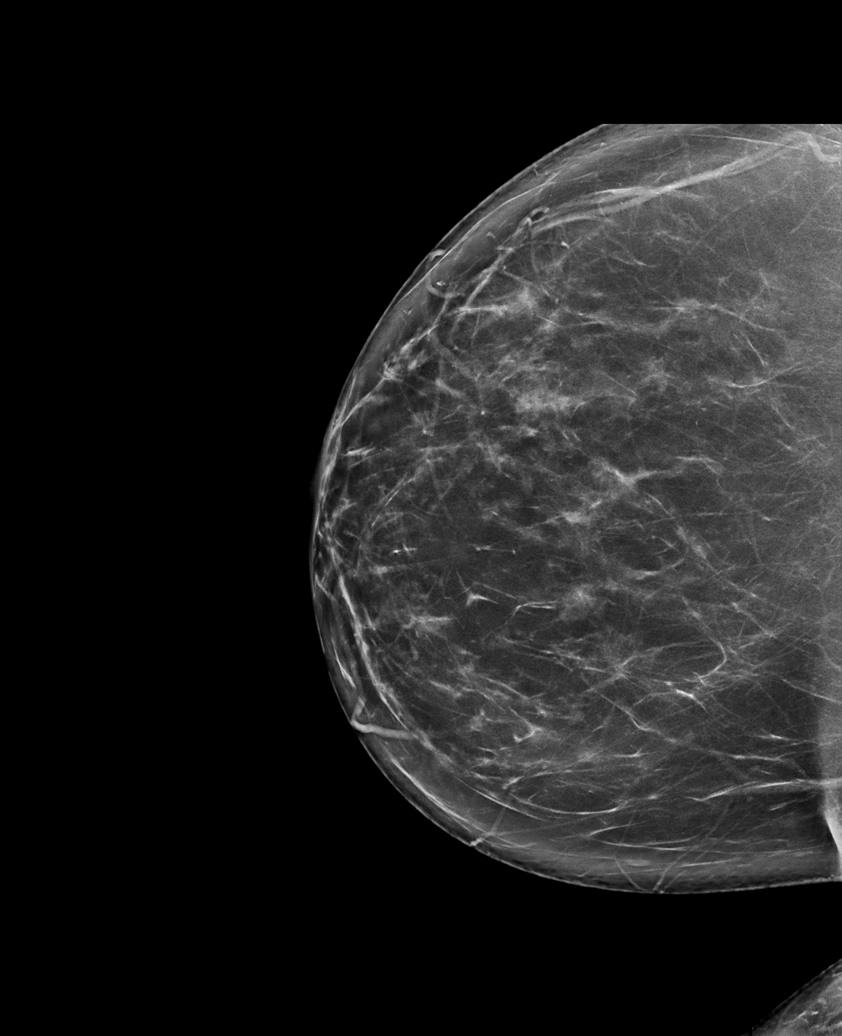

[R MLO synth-2D]
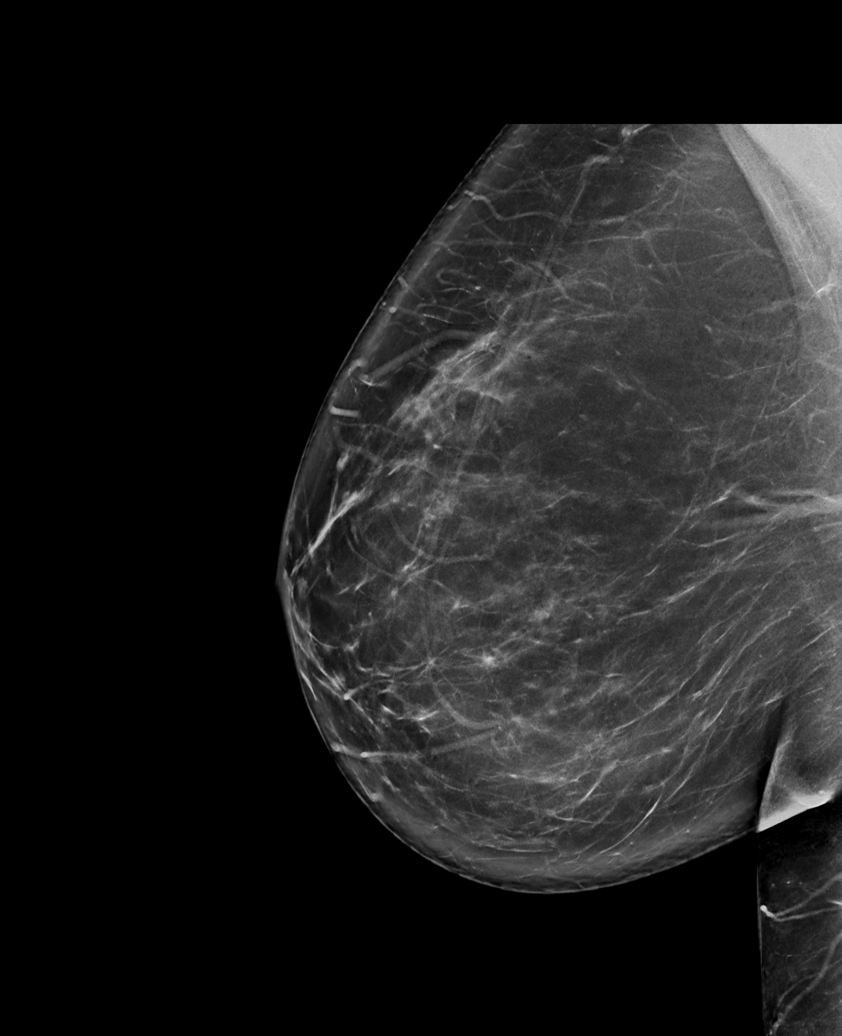

[L CC synth-2D]
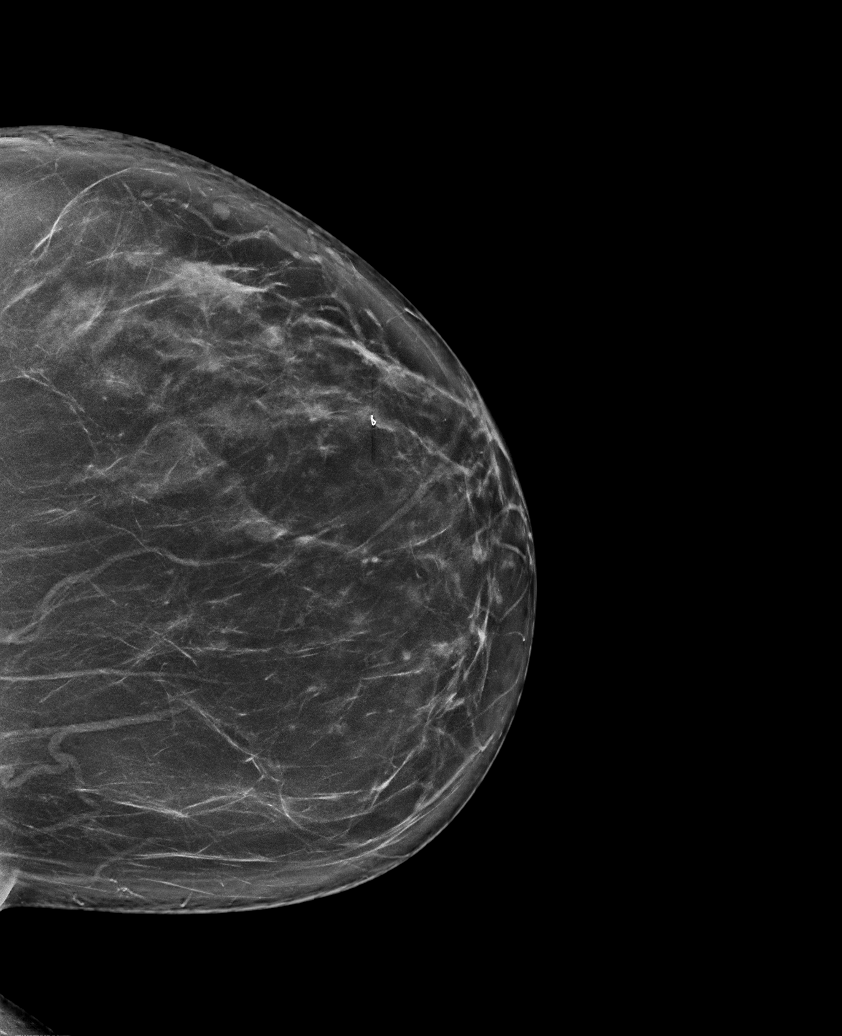

[L MLO tomo · tomo slice 53/105.0]
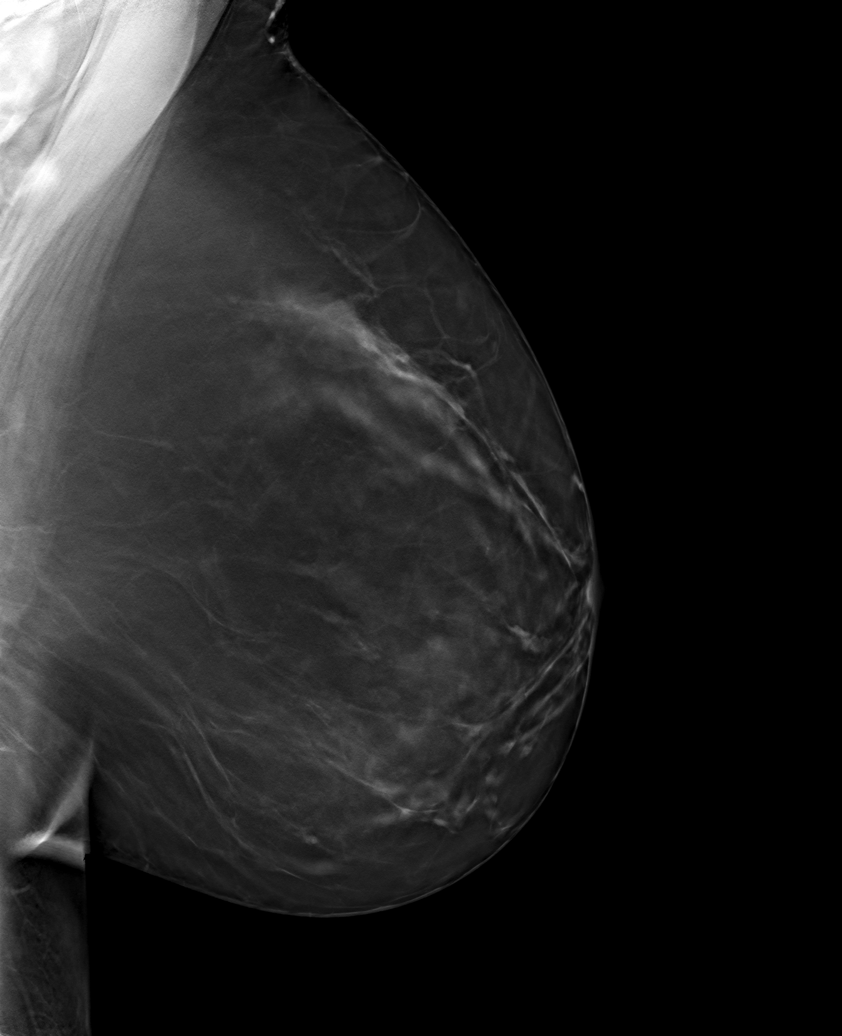

[L CC tomo · tomo slice 45/89.0]
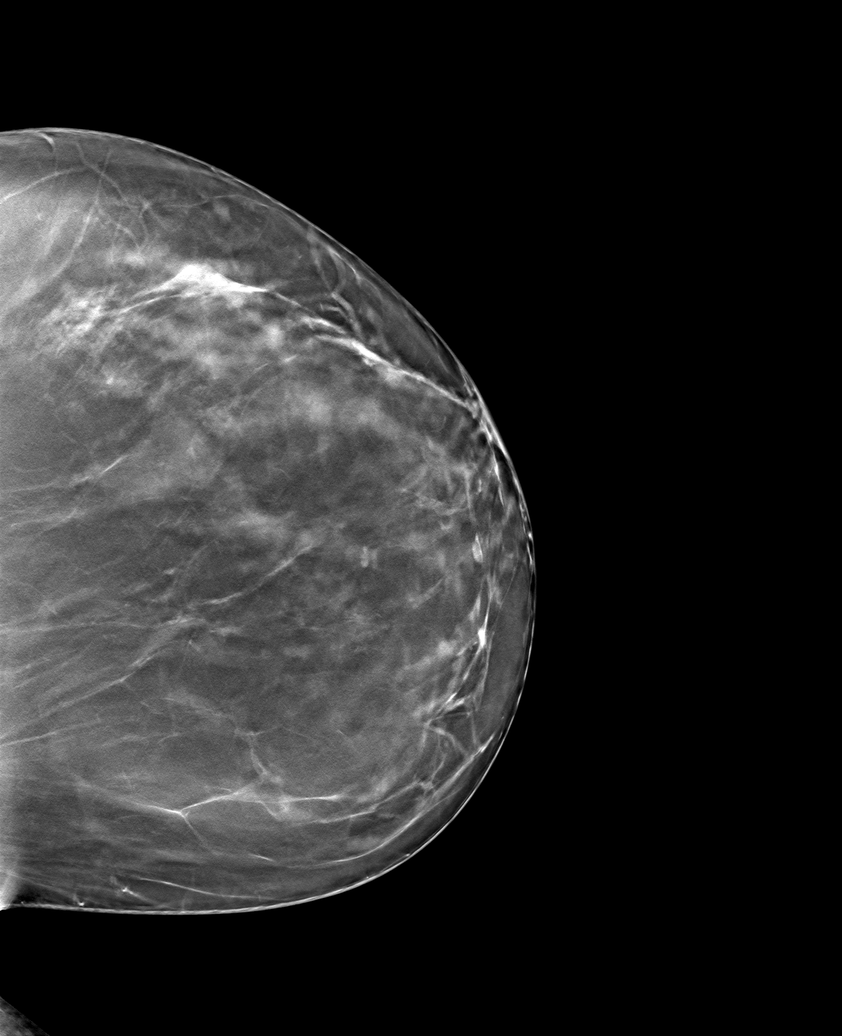

[6 of 14 positions shown; findings below may reference images not displayed]

ACR Breast Density Category b: There are scattered areas of
fibroglandular density.
FINDINGS: There are no findings suspicious for malignancy. The images were
evaluated with computer-aided detection.
IMPRESSION: No mammographic evidence of malignancy. A result letter of this
screening mammogram will be mailed directly to the patient.

RECOMMENDATION:
Screening mammogram in one year. (Code:[id number]-[id number])

BI-RADS CATEGORY  1: Negative.

## 2021-12-19 ENCOUNTER — Ambulatory Visit (HOSPITAL_COMMUNITY)
Admission: RE | Admit: 2021-12-19 | Discharge: 2021-12-19 | Disposition: A | Discharge status: Home / Self Care | Payer: Medicaid Other | Source: Ambulatory Visit | Attending: Physician Assistant | Admitting: Physician Assistant

## 2021-12-19 ENCOUNTER — Ambulatory Visit (INDEPENDENT_AMBULATORY_CARE_PROVIDER_SITE_OTHER): Payer: Medicaid Other

## 2021-12-19 ENCOUNTER — Encounter (HOSPITAL_COMMUNITY): Payer: Self-pay

## 2021-12-19 VITALS — BP 147/89 | HR 81 | Temp 98.0°F | Resp 16

## 2021-12-19 DIAGNOSIS — M25532 Pain in left wrist: Secondary | ICD-10-CM | POA: Diagnosis not present

## 2021-12-19 DIAGNOSIS — S63502A Unspecified sprain of left wrist, initial encounter: Secondary | ICD-10-CM

## 2021-12-19 MED ORDER — NAPROXEN 500 MG PO TABS: 500.0000 mg | ORAL_TABLET | Freq: Two times a day (BID) | ORAL | 0 refills | Status: DC

## 2021-12-19 NOTE — ED Triage Notes (Signed)
Patient c/o LFT wrist pain x 2 days.   Patient denies swelling, redness, and warmth.   Patient endorses onset of symptoms began after " attempting to pick someone up at work".   Patient endorses increased pain with movement.   Patient has taken Tylenol with some relief of symptoms.

## 2021-12-19 NOTE — Discharge Instructions (Signed)
Your x-ray was normal.  Use the brace for comfort and support.  Take Naprosyn up to twice a day for pain.  Do not take NSAIDs with this medication including aspirin, ibuprofen/Advil, naproxen/Aleve.  You can use Tylenol for breakthrough pain.  Keep it elevated and use ice for additional symptom relief.  Follow-up with Ortho if your symptoms are not improving.  If anything worsens you need to be seen immediately.

## 2021-12-19 NOTE — ED Provider Notes (Signed)
[place]    CSN: [id number] Arrival date & time: 12/19/21  1042      History   Chief Complaint Chief Complaint  Patient presents with   Wrist Pain   APPT     HPI [place] is a 51 y.o. female.   Patient presents today with a 2-day history of left ulnar wrist pain.  Reports that she was trying to lift/slide the patient at work when she felt the pain began.  She denies any additional injury or change in activity prior to symptom onset.  Pain is rated 7 on a 0-10 pain scale, described as intense aching, localized to ulnar left wrist without radiation, no aggravating relieving factors identified.  She has tried Tylenol with minimal improvement of symptoms.  Reports a previous injury requiring surgery to her left upper arm as well as her fifth left finger.  Denies any previous wrist injury or surgery.  She is right-handed.  Denies any numbness or paresthesias.  She is having difficulty with daily activities including sleep due to severity of pain.    Past Medical History:  Diagnosis Date   Arthritis    Asthma    Eczema     Patient Active Problem List   Diagnosis Date Noted   Traction alopecia 02/07/2021   Grief at loss of child 11/16/2019   Adjustment disorder with depressed mood 11/16/2019   Hair loss 11/16/2019   Essential hypertension, benign 11/16/2019   Fibroids 05/24/2019   Hypertension 05/24/2019   Obesity, Class I, BMI 30-34.9 05/24/2019   SUI (stress urinary incontinence, female) 05/24/2019   Closed displaced fracture of proximal phalanx of left little finger 10/27/2018   Bruising 03/25/2014   Eczema     Past Surgical History:  Procedure Laterality Date   HAND SURGERY Left 11/02/2018   LAPAROSCOPIC HYSTERECTOMY  02/21/2020    Robot-assisted total laparoscopic hysterectomy, bilateral salpingectomy, mid-urethral sling with cystoscopy ([facility]) on 02/21/2020 by Dr. Zigmund Daniel     OB History     Gravida  4   Para      Term       Preterm      AB  2   Living  2      SAB  2   IAB      Ectopic      Multiple      Live Births  2            Home Medications    Prior to Admission medications   Medication Sig Start Date End Date Taking? Authorizing Provider  hydrochlorothiazide (MICROZIDE) 12.5 MG capsule TAKE 1 CAPSULE(12.5 MG) BY MOUTH DAILY 10/22/21  Yes Glendale Chard, MD  minoxidil (LONITEN) 2.5 MG tablet Take 1/2 tab by mouth daily 02/07/21  Yes [provider]  montelukast (SINGULAIR) 10 MG tablet TAKE 1 TABLET BY MOUTH EVERY DAY IN THE EVENING 10/22/21  Yes Glendale Chard, MD  naproxen (NAPROSYN) 500 MG tablet Take 1 tablet (500 mg total) by mouth 2 (two) times daily. 12/19/21  Yes Anthonee Gelin K, PA-C  promethazine-dextromethorphan (PROMETHAZINE-DM) 6.25-15 MG/5ML syrup Take 5 mLs by mouth 3 (three) times daily as needed for cough. 11/07/21  Yes Perlie Mayo, NP  albuterol (VENTOLIN HFA) 108 (90 Base) MCG/ACT inhaler Inhale 2 puffs into the lungs every 6 (six) hours as needed for wheezing or shortness of breath. 10/24/21   Glendale Chard, MD  clobetasol (TEMOVATE) 0.05 % external solution Apply topically. 01/28/21   [provider]  clobetasol ointment (TEMOVATE) 0.05 % Apply to scalp 3-4 times per week. Not to face. 06/03/21   [provider]  EPINEPHrine 0.3 mg/0.3 mL IJ SOAJ injection Inject 0.3 mg into the muscle as needed for anaphylaxis. 10/21/20   Raechel Marcos, Derry Skill, PA-C    Family History Family History  Problem Relation Age of Onset   Hypertension Mother    Hypertension Father    Arthritis Father    Breast cancer Neg Hx     Social History Social History   Tobacco Use   Smoking status: Some Days    Packs/day: 0.50    Years: 6.00    Total pack years: 3.00    Types: Cigarettes    Start date: 11    Last attempt to quit: 07/09/2005    Years since quitting: 16.4   Smokeless tobacco: Never   Tobacco comments:    Started smoking 1992 1ppd x 7 years, 1/2 ppd x 8  years prior to that. Quit in 2007, started back in 2014. 1/2ppd since 2014  Vaping Use   Vaping Use: Never used  Substance Use Topics   Alcohol use: Yes    Alcohol/week: 3.0 - 4.0 standard drinks of alcohol    Types: 1 Glasses of wine, 2 - 3 Cans of beer per week    Comment: drinks beer 2-3 times per week, wine occassionally   Drug use: No     Allergies   Bee pollen, Adhesive [tape], and Pollen extract   Review of Systems Review of Systems  Constitutional:  Positive for activity change. Negative for appetite change, fatigue and fever.  Gastrointestinal:  Negative for abdominal pain, diarrhea, nausea and vomiting.  Musculoskeletal:  Positive for arthralgias. Negative for myalgias.  Skin:  Negative for color change and wound.  Neurological:  Negative for weakness and numbness.     Physical Exam Triage Vital Signs ED Triage Vitals  Enc Vitals Group     BP 12/19/21 1050 (!) 147/89     Pulse Rate 12/19/21 1050 81     Resp 12/19/21 1050 16     Temp 12/19/21 1050 98 F (36.7 C)     Temp Source 12/19/21 1050 Oral     SpO2 12/19/21 1050 95 %     Weight --      Height --      Head Circumference --      Peak Flow --      Pain Score 12/19/21 1054 7     Pain Loc --      Pain Edu? --      Excl. in [place]? --    No data found.  Updated Vital Signs BP (!) 147/89 (BP Location: Right Arm)   Pulse 81   Temp 98 F (36.7 C) (Oral)   Resp 16   LMP  (LMP Unknown) Comment: partial hysterectomy  SpO2 95%   Visual Acuity Right Eye Distance:   Left Eye Distance:   Bilateral Distance:    Right Eye Near:   Left Eye Near:    Bilateral Near:     Physical Exam Vitals reviewed.  Constitutional:      General: She is awake. She is not in acute distress.    Appearance: Normal appearance. She is well-developed. She is not ill-appearing.     Comments: Very pleasant female appears stated age in no acute distress sitting comfortably in exam room  HENT:     Head: Normocephalic and  atraumatic.  Cardiovascular:  Rate and Rhythm: Normal rate and regular rhythm.     Pulses:          Radial pulses are 2+ on the right side and 2+ on the left side.     Heart sounds: Normal heart sounds, S1 normal and S2 normal. No murmur heard.    Comments: Capillary refill within 2 seconds left hand Pulmonary:     Effort: Pulmonary effort is normal.     Breath sounds: Normal breath sounds. No wheezing, rhonchi or rales.     Comments: Clear to auscultation bilaterally Musculoskeletal:     Left wrist: Swelling and tenderness present. No deformity, bony tenderness or snuff box tenderness. Normal range of motion.     Left hand: No swelling. Normal range of motion. There is no disruption of two-point discrimination. Normal capillary refill.       Arms:  Psychiatric:        Behavior: Behavior is cooperative.      UC Treatments / Results  Labs (all labs ordered are listed, but only abnormal results are displayed) Labs Reviewed - No data to display  EKG   Radiology DG Wrist Complete Left  Result Date: 12/19/2021 CLINICAL DATA:  Left wrist pain for the past 2 days following a lifting injury. EXAM: LEFT WRIST - COMPLETE 3+ VIEW COMPARISON:  Left hand dated 10/23/2018 FINDINGS: Three interval screws in the 5th proximal phalanx with healing of the previously demonstrated 5th proximal phalanx fracture. The wrist has a normal appearance without fracture or dislocation. IMPRESSION: Negative. Electronically Signed   By: Claudie Revering M.D.   On: 12/19/2021 11:19    Procedures Procedures (including critical care time)  Medications Ordered in UC Medications - No data to display  Initial Impression / Assessment and Plan / UC Course  I have reviewed the triage vital signs and the nursing notes.  Pertinent labs & imaging results that were available during my care of the patient were reviewed by me and considered in my medical decision making (see chart for details).     X-ray obtained  showed no osseous abnormality.  Suspect sprain as etiology of symptoms.  Patient was placed in a brace for comfort and support.  Recommended RICE protocol for additional symptom relief.  She was started on Naprosyn twice daily with instructions to take additional NSAIDs with this medication.  If her symptoms or not improving quickly with conservative treatment measures she is to follow-up with orthopedics.  She was given contact information for local provider with instruction to call to schedule an appointment.  If she has any worsening symptoms including increased pain, decreased range of motion, swelling, fever, numbness, paresthesias she is to be seen immediately to which she expressed understanding.  Strict return precautions given.  Work excuse note provided.  Final Clinical Impressions(s) / UC Diagnoses   Final diagnoses:  Sprain of left wrist, initial encounter  Left wrist pain     Discharge Instructions      Your x-ray was normal.  Use the brace for comfort and support.  Take Naprosyn up to twice a day for pain.  Do not take NSAIDs with this medication including aspirin, ibuprofen/Advil, naproxen/Aleve.  You can use Tylenol for breakthrough pain.  Keep it elevated and use ice for additional symptom relief.  Follow-up with Ortho if your symptoms are not improving.  If anything worsens you need to be seen immediately.     ED Prescriptions     Medication Sig Dispense Auth. Provider  naproxen (NAPROSYN) 500 MG tablet Take 1 tablet (500 mg total) by mouth 2 (two) times daily. 20 tablet Willey Due, Derry Skill, PA-C      PDMP not reviewed this encounter.   Terrilee Croak, PA-C 12/19/21 1136

## 2021-12-22 DIAGNOSIS — M25532 Pain in left wrist: Secondary | ICD-10-CM | POA: Diagnosis not present

## 2022-01-02 DIAGNOSIS — M25532 Pain in left wrist: Secondary | ICD-10-CM | POA: Diagnosis not present

## 2022-03-25 ENCOUNTER — Encounter: Payer: Self-pay | Admitting: Internal Medicine

## 2022-03-25 ENCOUNTER — Ambulatory Visit (INDEPENDENT_AMBULATORY_CARE_PROVIDER_SITE_OTHER): Payer: Medicaid Other | Admitting: Internal Medicine

## 2022-03-25 VITALS — BP 134/74 | HR 98 | Temp 98.3°F | Ht 63.0 in | Wt 201.4 lb

## 2022-03-25 DIAGNOSIS — E049 Nontoxic goiter, unspecified: Secondary | ICD-10-CM | POA: Insufficient documentation

## 2022-03-25 DIAGNOSIS — Z716 Tobacco abuse counseling: Secondary | ICD-10-CM | POA: Diagnosis not present

## 2022-03-25 DIAGNOSIS — H6121 Impacted cerumen, right ear: Secondary | ICD-10-CM | POA: Diagnosis not present

## 2022-03-25 DIAGNOSIS — I10 Essential (primary) hypertension: Secondary | ICD-10-CM

## 2022-03-25 DIAGNOSIS — Z6835 Body mass index (BMI) 35.0-35.9, adult: Secondary | ICD-10-CM | POA: Diagnosis not present

## 2022-03-25 DIAGNOSIS — E559 Vitamin D deficiency, unspecified: Secondary | ICD-10-CM | POA: Diagnosis not present

## 2022-03-25 DIAGNOSIS — Z0000 Encounter for general adult medical examination without abnormal findings: Secondary | ICD-10-CM

## 2022-03-25 DIAGNOSIS — R0982 Postnasal drip: Secondary | ICD-10-CM

## 2022-03-25 DIAGNOSIS — E7800 Pure hypercholesterolemia, unspecified: Secondary | ICD-10-CM

## 2022-03-25 DIAGNOSIS — J4520 Mild intermittent asthma, uncomplicated: Secondary | ICD-10-CM | POA: Insufficient documentation

## 2022-03-25 DIAGNOSIS — E6609 Other obesity due to excess calories: Secondary | ICD-10-CM

## 2022-03-25 DIAGNOSIS — E66812 Obesity, class 2: Secondary | ICD-10-CM | POA: Insufficient documentation

## 2022-03-25 LAB — POCT URINALYSIS DIPSTICK
Bilirubin, UA: NEGATIVE
Glucose, UA: NEGATIVE
Ketones, UA: NEGATIVE
Leukocytes, UA: NEGATIVE
Nitrite, UA: NEGATIVE
Protein, UA: NEGATIVE
Spec Grav, UA: 1.025 (ref 1.010–1.025)
Urobilinogen, UA: 0.2 E.U./dL
pH, UA: 7 (ref 5.0–8.0)

## 2022-03-25 NOTE — Progress Notes (Signed)
Barnet Glasgow Martin,acting as a Education administrator for [name], MD.,have documented all relevant documentation on the behalf of [name], MD,as directed by  [name], MD while in the presence of [name], MD.   Subjective:     Patient ID: Lisa Nash , female    DOB: 31-Aug-1970 , 51 y.o.   MRN: [id number]   Chief Complaint  Patient presents with   Annual Exam   Hypertension    HPI  The patient is here today for a physical examination.  She has her pelvic exams performed by Gyn at [place]. She reports compliance with meds. She denies headaches, chest pain and shortness of breath.   BP Readings from Last 3 Encounters: 03/25/22 : 134/74 12/19/21 : (!) 147/89 10/22/21 : 122/78    Hypertension This is a chronic problem. The current episode started more than 1 year ago. The problem has been gradually improving since onset. The problem is controlled. Pertinent negatives include no blurred vision, chest pain, palpitations or shortness of breath. The current treatment provides moderate improvement. Compliance problems include exercise.      Past Medical History:  Diagnosis Date   Arthritis    Asthma    Eczema      Family History  Problem Relation Age of Onset   Hypertension Mother    Hypertension Father    Arthritis Father    Breast cancer Neg Hx      Current Outpatient Medications:    albuterol (VENTOLIN HFA) 108 (90 Base) MCG/ACT inhaler, Inhale 2 puffs into the lungs every 6 (six) hours as needed for wheezing or shortness of breath., Disp: 18 g, Rfl: 1   clobetasol (TEMOVATE) 0.05 % external solution, Apply topically., Disp: , Rfl:    clobetasol ointment (TEMOVATE) 0.05 %, Apply to scalp 3-4 times per week. Not to face., Disp: , Rfl:    EPINEPHrine 0.3 mg/0.3 mL IJ SOAJ injection, Inject 0.3 mg into the muscle as needed for anaphylaxis., Disp: 1 each, Rfl: 0   hydrochlorothiazide (MICROZIDE) 12.5 MG capsule, TAKE 1 CAPSULE(12.5 MG) BY MOUTH DAILY,  Disp: 90 capsule, Rfl: 1   minoxidil (LONITEN) 2.5 MG tablet, Take 1/2 tab by mouth daily, Disp: , Rfl:    montelukast (SINGULAIR) 10 MG tablet, TAKE 1 TABLET BY MOUTH EVERY DAY IN THE EVENING, Disp: 90 tablet, Rfl: 3   naproxen (NAPROSYN) 500 MG tablet, Take 1 tablet (500 mg total) by mouth 2 (two) times daily. (Patient not taking: Reported on 03/25/2022), Disp: 20 tablet, Rfl: 0   promethazine-dextromethorphan (PROMETHAZINE-DM) 6.25-15 MG/5ML syrup, Take 5 mLs by mouth 3 (three) times daily as needed for cough. (Patient not taking: Reported on 03/25/2022), Disp: 118 mL, Rfl: 0   Allergies  Allergen Reactions   Bee Pollen Anaphylaxis   Adhesive [Tape]    Pollen Extract       The patient states she uses status post hysterectomy for birth control. Last LMP was No LMP recorded (lmp unknown). Patient has had a hysterectomy.. Negative for Dysmenorrhea. Negative for: breast discharge, breast lump(s), breast pain and breast self exam. Associated symptoms include abnormal vaginal bleeding. Pertinent negatives include abnormal bleeding (hematology), anxiety, decreased libido, depression, difficulty falling sleep, dyspareunia, history of infertility, nocturia, sexual dysfunction, sleep disturbances, urinary incontinence, urinary urgency, vaginal discharge and vaginal itching. Diet regular.The patient states her exercise level is  intermittent.  . The patient's tobacco use is:  Social History   Tobacco Use  Smoking Status Some Days  Packs/day: 0.50   Years: 6.00   Total pack years: 3.00   Types: Cigarettes   Start date: 60   Last attempt to quit: 07/09/2005   Years since quitting: 16.7  Smokeless Tobacco Never  Tobacco Comments   Started smoking 1992 1ppd x 7 years, 1/2 ppd x 8 years prior to that. Quit in 2007, started back in 2014. 1/2ppd since 2014 smokes 3/4ppd x 9 years  . She has been exposed to passive smoke. The patient's alcohol use is:  Social History   Substance and Sexual Activity   Alcohol Use Yes   Alcohol/week: 3.0 - 4.0 standard drinks of alcohol   Types: 1 Glasses of wine, 2 - 3 Cans of beer per week   Comment: drinks beer 2-3 times per week, wine occassionally    Review of Systems  Constitutional: Negative.   HENT: Negative.    Eyes: Negative.  Negative for blurred vision.  Respiratory: Negative.  Negative for shortness of breath.   Cardiovascular: Negative.  Negative for chest pain and palpitations.  Gastrointestinal: Negative.   Endocrine: Negative.   Genitourinary: Negative.   Musculoskeletal: Negative.   Skin: Negative.   Allergic/Immunologic: Negative.   Neurological: Negative.   Hematological: Negative.   Psychiatric/Behavioral: Negative.       Today's Vitals   03/25/22 0852  BP: 134/74  Pulse: 98  Temp: 98.3 F (36.8 C)  TempSrc: Oral  Weight: 201 lb 6.4 oz (91.4 kg)  Height: _0  (1.6 m)  PainSc: 0-No pain   Body mass index is 35.68 kg/m.  Wt Readings from Last 3 Encounters:  03/25/22 201 lb 6.4 oz (91.4 kg)  10/22/21 196 lb 12.8 oz (89.3 kg)  06/19/21 194 lb 6.4 oz (88.2 kg)    Objective:  Physical Exam Vitals and nursing note reviewed.  Constitutional:      Appearance: Normal appearance.  HENT:     Head: Normocephalic and atraumatic.     Right Ear: Ear canal and external ear normal. There is impacted cerumen.     Left Ear: Tympanic membrane, ear canal and external ear normal.     Nose:     Comments: Masked     Mouth/Throat:     Comments: Masked  Eyes:     Extraocular Movements: Extraocular movements intact.     Conjunctiva/sclera: Conjunctivae normal.     Pupils: Pupils are equal, round, and reactive to light.  Neck:     Thyroid: Thyromegaly present.  Cardiovascular:     Rate and Rhythm: Normal rate and regular rhythm.     Pulses: Normal pulses.     Heart sounds: Normal heart sounds.  Pulmonary:     Effort: Pulmonary effort is normal.     Breath sounds: Normal breath sounds.  Chest:  Breasts:    Tanner  Score is 5.     Right: Normal.     Left: Normal.  Abdominal:     General: Abdomen is flat. Bowel sounds are normal.     Palpations: Abdomen is soft.  Genitourinary:    Comments: deferred Musculoskeletal:        General: Normal range of motion.     Cervical back: Normal range of motion and neck supple.  Skin:    General: Skin is warm and dry.  Neurological:     General: No focal deficit present.     Mental Status: She is alert and oriented to person, place, and time.  Psychiatric:        Mood  and Affect: Mood normal.        Behavior: Behavior normal.     Assessment And Plan:     1. Encounter for health maintenance examination Comments: A full exam was performed. IMportance of monthly self breast exams was discussed with the patient.  PATIENT IS ADVISED TO GET 30-45 MINUTES REGULAR EXERCISE NO LESS THAN FOUR TO FIVE DAYS PER WEEK - BOTH WEIGHTBEARING EXERCISES AND AEROBIC ARE RECOMMENDED.  PATIENT IS ADVISED TO FOLLOW A HEALTHY DIET WITH AT LEAST SIX FRUITS/VEGGIES PER DAY, DECREASE INTAKE OF RED MEAT, AND TO INCREASE FISH INTAKE TO TWO DAYS PER WEEK.  MEATS/FISH SHOULD NOT BE FRIED, BAKED OR BROILED IS PREFERABLE.  IT IS ALSO IMPORTANT TO CUT BACK ON YOUR SUGAR INTAKE. PLEASE AVOID ANYTHING WITH ADDED SUGAR, CORN SYRUP OR OTHER SWEETENERS. IF YOU MUST USE A SWEETENER, YOU CAN TRY STEVIA. IT IS ALSO IMPORTANT TO AVOID ARTIFICIALLY SWEETENERS AND DIET BEVERAGES. LASTLY, I SUGGEST WEARING SPF 50 SUNSCREEN ON EXPOSED PARTS AND ESPECIALLY WHEN IN THE DIRECT SUNLIGHT FOR AN EXTENDED PERIOD OF TIME.  PLEASE AVOID FAST FOOD RESTAURANTS AND INCREASE YOUR WATER INTAKE. - CMP14+EGFR - CBC - Hemoglobin A1c - Lipid panel - Insulin, random(561)  2. Essential hypertension, benign Comments: Chronic, fair control. Goal BP<130/80. EKG performed, NSR w/o acute changes. She is encouraged to incorporate more exercise into her daily routine.  She will rto in six months for re-evaluation.  - POCT Urinalysis  Dipstick (81002) - Microalbumin / creatinine urine ratio - EKG 12-Lead  3. Impacted cerumen of right ear Comments: AFter obtaining verbal consent, right ear was flushed by irrigation w/o complications. NO TM abnormalities were noted. - Ear Lavage  4. Vitamin D deficiency disease Comments: I will check a vitamin D level and supplement as needed. - Vitamin D (25 hydroxy)  5. Class 2 obesity due to excess calories without serious comorbidity with body mass index (BMI) of 35.0 to 35.9 in adult Comments: She is encouraged to aim for at least 150 minutes of exercise per week while striving for BMI<30 to decrease cardiac risk.  6. Encounter for tobacco use cessation counseling Smoking cessation instruction/counseling given:  counseled patient on the dangers of tobacco use, advised patient to stop smoking, and reviewed strategies to maximize success.  Patient was given opportunity to ask questions. Patient verbalized understanding of the plan and was able to repeat key elements of the plan. All questions were answered to their satisfaction.   I, [name], MD, have reviewed all documentation for this visit. The documentation on 03/25/22 for the exam, diagnosis, procedures, and orders are all accurate and complete.   THE PATIENT IS ENCOURAGED TO PRACTICE SOCIAL DISTANCING DUE TO THE COVID-19 PANDEMIC.

## 2022-03-25 NOTE — Patient Instructions (Addendum)
The 10-year ASCVD risk score (Arnett DK, et al., 2019) is: 11.1%   Values used to calculate the score:     Age: 51 years     Sex: Female     Is Non-Hispanic African American: Yes     Diabetic: No     Tobacco smoker: Yes     Systolic Blood Pressure: 782 mmHg     Is BP treated: Yes     HDL Cholesterol: 44 mg/dL     Total Cholesterol: 193 mg/dL  Coronary Calcium Scan A coronary calcium scan is an imaging test used to look for deposits of plaque in the inner lining of the blood vessels of the heart (coronary arteries). Plaque is made up of calcium, protein, and fatty substances. These deposits of plaque can partly clog and narrow the coronary arteries without producing any symptoms or warning signs. This puts a person at risk for a heart attack. A coronary calcium scan is performed using a computed tomography (CT) scanner machine without using a dye (contrast). This test is recommended for people who are at moderate risk for heart disease. The test can find plaque deposits before symptoms develop. Tell a health care provider about: Any allergies you have. All medicines you are taking, including vitamins, herbs, eye drops, creams, and over-the-counter medicines. Any problems you or family members have had with anesthetic medicines. Any bleeding problems you have. Any surgeries you have had. Any medical conditions you have. Whether you are pregnant or may be pregnant. What are the risks? Generally, this is a safe procedure. However, problems may occur, including: Harm to a pregnant woman and her unborn baby. This test involves the use of radiation. Radiation exposure can be dangerous to a pregnant woman and her unborn baby. If you are pregnant or think you may be pregnant, you should not have this procedure done. A slight increase in the risk of cancer. This is because of the radiation involved in the test. The amount of radiation from one test is similar to the amount of radiation you are  naturally exposed to over one year. What happens before the procedure? Ask your health care provider for any specific instructions on how to prepare for this procedure. You may be asked to avoid products that contain caffeine, tobacco, or nicotine for 4 hours before the procedure. What happens during the procedure?  You will undress and remove any jewelry from your neck or chest. You may need to remove hearing aides and dentures. Women may need to remove their bras. You will put on a hospital gown. Sticky electrodes will be placed on your chest. The electrodes will be connected to an electrocardiogram (ECG) machine to record a tracing of the electrical activity of your heart. You will lie down on your back on a curved bed that is attached to the [place]. You may be given medicine to slow down your heart rate so that clear pictures can be created. You will be moved into the CT scanner, and the CT scanner will take pictures of your heart. During this time, you will be asked to lie still and hold your breath for 10-20 seconds at a time while each picture of your heart is being taken. The procedure may vary among health care providers and hospitals. What can I expect after the procedure? You can return to your normal activities. It is up to you to get the results of your procedure. Ask your health care provider, or the department that is doing  the procedure, when your results will be ready. Summary A coronary calcium scan is an imaging test used to look for deposits of plaque in the inner lining of the blood vessels of the heart. Plaque is made up of calcium, protein, and fatty substances. A coronary calcium scan is performed using a CT scanner machine without contrast. Generally, this is a safe procedure. Tell your health care provider if you are pregnant or may be pregnant. Ask your health care provider for any specific instructions on how to prepare for this procedure. You can return to your  normal activities after the scan is done. This information is not intended to replace advice given to you by your health care provider. Make sure you discuss any questions you have with your health care provider. Document Revised: 03/31/2021 Document Reviewed: 03/31/2021 Elsevier Patient Education  [place] Maintenance, Female Adopting a healthy lifestyle and getting preventive care are important in promoting health and wellness. Ask your health care provider about: The right schedule for you to have regular tests and exams. Things you can do on your own to prevent diseases and keep yourself healthy. What should I know about diet, weight, and exercise? Eat a healthy diet  Eat a diet that includes plenty of vegetables, fruits, low-fat dairy products, and lean protein. Do not eat a lot of foods that are high in solid fats, added sugars, or sodium. Maintain a healthy weight Body mass index (BMI) is used to identify weight problems. It estimates body fat based on height and weight. Your health care provider can help determine your BMI and help you achieve or maintain a healthy weight. Get regular exercise Get regular exercise. This is one of the most important things you can do for your health. Most adults should: Exercise for at least 150 minutes each week. The exercise should increase your heart rate and make you sweat (moderate-intensity exercise). Do strengthening exercises at least twice a week. This is in addition to the moderate-intensity exercise. Spend less time sitting. Even light physical activity can be beneficial. Watch cholesterol and blood lipids Have your blood tested for lipids and cholesterol at 51 years of age, then have this test every 5 years. Have your cholesterol levels checked more often if: Your lipid or cholesterol levels are high. You are older than 51 years of age. You are at high risk for heart disease. What should I know about cancer  screening? Depending on your health history and family history, you may need to have cancer screening at various ages. This may include screening for: Breast cancer. Cervical cancer. Colorectal cancer. Skin cancer. Lung cancer. What should I know about heart disease, diabetes, and high blood pressure? Blood pressure and heart disease High blood pressure causes heart disease and increases the risk of stroke. This is more likely to develop in people who have high blood pressure readings or are overweight. Have your blood pressure checked: Every 3-5 years if you are 30-32 years of age. Every year if you are 46 years old or older. Diabetes Have regular diabetes screenings. This checks your fasting blood sugar level. Have the screening done: Once every three years after age 4 if you are at a normal weight and have a low risk for diabetes. More often and at a younger age if you are overweight or have a high risk for diabetes. What should I know about preventing infection? Hepatitis B If you have a higher risk for hepatitis B, you should  be screened for this virus. Talk with your health care provider to find out if you are at risk for hepatitis B infection. Hepatitis C Testing is recommended for: Everyone born from [birth year] through 1965. Anyone with known risk factors for hepatitis C. Sexually transmitted infections (STIs) Get screened for STIs, including gonorrhea and chlamydia, if: You are sexually active and are younger than 51 years of age. You are older than 51 years of age and your health care provider tells you that you are at risk for this type of infection. Your sexual activity has changed since you were last screened, and you are at increased risk for chlamydia or gonorrhea. Ask your health care provider if you are at risk. Ask your health care provider about whether you are at high risk for HIV. Your health care provider may recommend a prescription medicine to help prevent HIV  infection. If you choose to take medicine to prevent HIV, you should first get tested for HIV. You should then be tested every 3 months for as long as you are taking the medicine. Pregnancy If you are about to stop having your period (premenopausal) and you may become pregnant, seek counseling before you get pregnant. Take 400 to 800 micrograms (mcg) of folic acid every day if you become pregnant. Ask for birth control (contraception) if you want to prevent pregnancy. Osteoporosis and menopause Osteoporosis is a disease in which the bones lose minerals and strength with aging. This can result in bone fractures. If you are 26 years old or older, or if you are at risk for osteoporosis and fractures, ask your health care provider if you should: Be screened for bone loss. Take a calcium or vitamin D supplement to lower your risk of fractures. Be given hormone replacement therapy (HRT) to treat symptoms of menopause. Follow these instructions at home: Alcohol use Do not drink alcohol if: Your health care provider tells you not to drink. You are pregnant, may be pregnant, or are planning to become pregnant. If you drink alcohol: Limit how much you have to: 0-1 drink a day. Know how much alcohol is in your drink. In the [place] one drink equals one 12 oz bottle of beer (355 mL), one 5 oz glass of wine (148 mL), or one 1 oz glass of hard liquor (44 mL). Lifestyle Do not use any products that contain nicotine or tobacco. These products include cigarettes, chewing tobacco, and vaping devices, such as e-cigarettes. If you need help quitting, ask your health care provider. Do not use street drugs. Do not share needles. Ask your health care provider for help if you need support or information about quitting drugs. General instructions Schedule regular health, dental, and eye exams. Stay current with your vaccines. Tell your health care provider if: You often feel depressed. You have ever been abused  or do not feel safe at home. Summary Adopting a healthy lifestyle and getting preventive care are important in promoting health and wellness. Follow your health care provider's instructions about healthy diet, exercising, and getting tested or screened for diseases. Follow your health care provider's instructions on monitoring your cholesterol and blood pressure. This information is not intended to replace advice given to you by your health care provider. Make sure you discuss any questions you have with your health care provider. Document Revised: 09/10/2020 Document Reviewed: 09/10/2020 Elsevier Patient Education  [place].

## 2022-03-26 LAB — VITAMIN D 25 HYDROXY (VIT D DEFICIENCY, FRACTURES): Vit D, 25-Hydroxy: 30.8 ng/mL (ref 30.0–100.0)

## 2022-03-26 LAB — CMP14+EGFR
ALT: 18 IU/L (ref 0–32)
AST: 16 IU/L (ref 0–40)
Albumin/Globulin Ratio: 1.7 (ref 1.2–2.2)
Albumin: 4.5 g/dL (ref 3.8–4.9)
Alkaline Phosphatase: 87 IU/L (ref 44–121)
BUN/Creatinine Ratio: 15 (ref 9–23)
BUN: 14 mg/dL (ref 6–24)
Bilirubin Total: <0.2 mg/dL (ref 0.0–1.2)
CO2: 24 mmol/L (ref 20–29)
Calcium: 9.8 mg/dL (ref 8.7–10.2)
Chloride: 100 mmol/L (ref 96–106)
Creatinine, Ser: 0.94 mg/dL (ref 0.57–1.00)
Globulin, Total: 2.7 g/dL (ref 1.5–4.5)
Glucose: 136 mg/dL — ABNORMAL HIGH (ref 70–99)
Potassium: 4 mmol/L (ref 3.5–5.2)
Sodium: 139 mmol/L (ref 134–144)
Total Protein: 7.2 g/dL (ref 6.0–8.5)
eGFR: 73 mL/min/{1.73_m2} (ref >59)

## 2022-03-26 LAB — LIPID PANEL
Chol/HDL Ratio: 4 ratio (ref 0.0–4.4)
Cholesterol, Total: 192 mg/dL (ref 100–199)
HDL: 48 mg/dL (ref >39)
LDL Chol Calc (NIH): 121 mg/dL — ABNORMAL HIGH (ref 0–99)
Triglycerides: 127 mg/dL (ref 0–149)
VLDL Cholesterol Cal: 23 mg/dL (ref 5–40)

## 2022-03-26 LAB — CBC
Hematocrit: 40.1 % (ref 34.0–46.6)
Hemoglobin: 13.2 g/dL (ref 11.1–15.9)
MCH: 29.9 pg (ref 26.6–33.0)
MCHC: 32.9 g/dL (ref 31.5–35.7)
MCV: 91 fL (ref 79–97)
Platelets: 296 10*3/uL (ref 150–450)
RBC: 4.41 x10E6/uL (ref 3.77–5.28)
RDW: 13.6 % (ref 11.7–15.4)
WBC: 9.5 10*3/uL (ref 3.4–10.8)

## 2022-03-26 LAB — INSULIN, RANDOM: INSULIN: 356 u[IU]/mL — ABNORMAL HIGH (ref 2.6–24.9)

## 2022-03-26 LAB — HEMOGLOBIN A1C
Est. average glucose Bld gHb Est-mCnc: 126 mg/dL
Hgb A1c MFr Bld: 6 % — ABNORMAL HIGH (ref 4.8–5.6)

## 2022-03-26 LAB — MICROALBUMIN / CREATININE URINE RATIO
Creatinine, Urine: 45.4 mg/dL
Microalb/Creat Ratio: <7 mg/g creat (ref 0–29)
Microalbumin, Urine: <3 ug/mL

## 2022-03-30 DIAGNOSIS — H6121 Impacted cerumen, right ear: Secondary | ICD-10-CM | POA: Insufficient documentation

## 2022-04-04 ENCOUNTER — Other Ambulatory Visit: Payer: Self-pay

## 2022-04-07 ENCOUNTER — Other Ambulatory Visit: Payer: Self-pay | Admitting: Internal Medicine

## 2022-04-07 MED ORDER — NICOTINE 21 MG/24HR TD PT24: 21.0000 mg | MEDICATED_PATCH | TRANSDERMAL | 0 refills | Status: AC

## 2022-05-16 ENCOUNTER — Other Ambulatory Visit: Payer: Self-pay | Admitting: Internal Medicine

## 2022-06-26 ENCOUNTER — Encounter: Payer: Medicaid Other | Admitting: Internal Medicine

## 2022-06-26 NOTE — Progress Notes (Signed)
No show

## 2022-09-09 ENCOUNTER — Other Ambulatory Visit: Payer: Self-pay | Admitting: Internal Medicine

## 2022-09-09 DIAGNOSIS — Z0000 Encounter for general adult medical examination without abnormal findings: Secondary | ICD-10-CM

## 2022-09-18 ENCOUNTER — Ambulatory Visit
Admission: RE | Admit: 2022-09-18 | Discharge: 2022-09-18 | Disposition: A | Discharge status: Home / Self Care | Payer: Medicaid Other | Source: Ambulatory Visit | Attending: Internal Medicine | Admitting: Internal Medicine

## 2022-09-18 DIAGNOSIS — Z0000 Encounter for general adult medical examination without abnormal findings: Secondary | ICD-10-CM

## 2022-09-18 DIAGNOSIS — Z1231 Encounter for screening mammogram for malignant neoplasm of breast: Secondary | ICD-10-CM | POA: Diagnosis not present

## 2022-09-23 ENCOUNTER — Encounter: Payer: Self-pay | Admitting: Internal Medicine

## 2022-09-23 ENCOUNTER — Ambulatory Visit: Payer: Medicaid Other | Admitting: Internal Medicine

## 2022-09-23 VITALS — BP 116/60 | HR 60 | Temp 98.3°F | Ht 63.0 in | Wt 197.8 lb

## 2022-09-23 DIAGNOSIS — E6609 Other obesity due to excess calories: Secondary | ICD-10-CM

## 2022-09-23 DIAGNOSIS — I10 Essential (primary) hypertension: Secondary | ICD-10-CM | POA: Diagnosis not present

## 2022-09-23 DIAGNOSIS — R7309 Other abnormal glucose: Secondary | ICD-10-CM | POA: Diagnosis not present

## 2022-09-23 DIAGNOSIS — Z716 Tobacco abuse counseling: Secondary | ICD-10-CM

## 2022-09-23 DIAGNOSIS — F17200 Nicotine dependence, unspecified, uncomplicated: Secondary | ICD-10-CM | POA: Diagnosis not present

## 2022-09-23 DIAGNOSIS — Z6835 Body mass index (BMI) 35.0-35.9, adult: Secondary | ICD-10-CM

## 2022-09-23 NOTE — Progress Notes (Signed)
Jeri Cos [place],acting as a Neurosurgeon for Gwynneth Aliment, MD.,have documented all relevant documentation on the behalf of Gwynneth Aliment, MD,as directed by  Gwynneth Aliment, MD while in the presence of Gwynneth Aliment, MD.    Subjective:     Patient ID: Lisa Nash , female    DOB: 03/09/1971 , 52 y.o.   MRN: [id number]   Chief Complaint  Patient presents with   Hypertension    HPI  She is here today for blood pressure check. She reports compliance with meds. She denies headaches, chest pain and shortness of breath. She is still smoking, currently smoking 1ppd.   Hypertension This is a chronic problem. The current episode started more than 1 year ago. The problem has been gradually improving since onset. The problem is controlled. Pertinent negatives include no blurred vision. The current treatment provides moderate improvement. Compliance problems include exercise.      Past Medical History:  Diagnosis Date   Arthritis    Asthma    Eczema      Family History  Problem Relation Age of Onset   Hypertension Mother    Hypertension Father    Arthritis Father    Breast cancer Neg Hx      Current Outpatient Medications:    albuterol (VENTOLIN HFA) 108 (90 Base) MCG/ACT inhaler, Inhale 2 puffs into the lungs every 6 (six) hours as needed for wheezing or shortness of breath., Disp: 18 g, Rfl: 1   clobetasol (TEMOVATE) 0.05 % external solution, Apply topically., Disp: , Rfl:    clobetasol ointment (TEMOVATE) 0.05 %, Apply to scalp 3-4 times per week. Not to face., Disp: , Rfl:    EPINEPHrine 0.3 mg/0.3 mL IJ SOAJ injection, Inject 0.3 mg into the muscle as needed for anaphylaxis., Disp: 1 each, Rfl: 0   hydrochlorothiazide (MICROZIDE) 12.5 MG capsule, TAKE 1 CAPSULE(12.5 MG) BY MOUTH DAILY, Disp: 90 capsule, Rfl: 1   minoxidil (LONITEN) 2.5 MG tablet, Take 1/2 tab by mouth daily, Disp: , Rfl:    montelukast (SINGULAIR) 10 MG tablet, TAKE 1 TABLET BY MOUTH EVERY DAY IN  THE EVENING, Disp: 90 tablet, Rfl: 3   nicotine (NICODERM CQ - DOSED IN MG/24 HOURS) 21 mg/24hr patch, Place 1 patch (21 mg total) onto the skin daily., Disp: 30 patch, Rfl: 0   Allergies  Allergen Reactions   Bee Pollen Anaphylaxis   Adhesive [Tape]    Pollen Extract      Review of Systems  Constitutional: Negative.   Eyes: Negative.  Negative for blurred vision.  Respiratory: Negative.    Cardiovascular: Negative.   Gastrointestinal: Negative.   Musculoskeletal: Negative.   Skin: Negative.   Neurological: Negative.   Psychiatric/Behavioral: Negative.       Today's Vitals   09/23/22 0911  BP: 116/60  Pulse: 60  Temp: 98.3 F (36.8 C)  Weight: 197 lb 12.8 oz (89.7 kg)  Height: 5\' 3"  (1.6 m)  PainSc: 0-No pain   Body mass index is 35.04 kg/m.  The 10-year ASCVD risk score (Arnett DK, et al., 2019) is: 5.7%   Values used to calculate the score:     Age: 44 years     Sex: Female     Is Non-Hispanic African American: Yes     Diabetic: No     Tobacco smoker: Yes     Systolic Blood Pressure: 116 mmHg     Is BP treated: Yes     HDL Cholesterol: 48 mg/dL  Total Cholesterol: 192 mg/dL ++ Objective:  Physical Exam Vitals and nursing note reviewed.  Constitutional:      Appearance: Normal appearance.  HENT:     Head: Normocephalic and atraumatic.  Eyes:     Extraocular Movements: Extraocular movements intact.  Cardiovascular:     Rate and Rhythm: Normal rate and regular rhythm.     Heart sounds: Normal heart sounds.  Pulmonary:     Effort: Pulmonary effort is normal.     Breath sounds: Normal breath sounds.  Musculoskeletal:     Cervical back: Normal range of motion.  Skin:    General: Skin is warm.  Neurological:     General: No focal deficit present.     Mental Status: She is alert.  Psychiatric:        Mood and Affect: Mood normal.        Behavior: Behavior normal.     Assessment And Plan:     1. Essential hypertension, benign Comments: Chronic,  well controlled. She will c/w HCTZ 12.5mg  daily. Advised to follow low sodium diet. No med changes. - CMP14+EGFR  2. Other abnormal glucose Comments: Previous labs reviewed, her a1c has been elevated in the past. I will recheck an a1c today. Reminded to decrease intake of sugary beverages/foods. - Hemoglobin A1c  3. Tobacco use disorder Comments: She agrees w/ LDCT screening. Will make further recommendations once her results are available for review. - CT CHEST LUNG CA SCREEN LOW DOSE W/O CM; Future  4. Class 2 obesity due to excess calories without serious comorbidity with body mass index (BMI) of 35.0 to 35.9 in adult Comments: She is encouraged to aim for at least 150 minutes of exercise/week, while striving for BMI<30 to decrease cardiac risk.  5. Encounter for tobacco use cessation counseling Smoking cessation instruction/counseling given:  counseled patient on the dangers of tobacco use, advised patient to stop smoking, and reviewed strategies to maximize success   Return for keep next appt as scheduled .  Patient was given opportunity to ask questions. Patient verbalized understanding of the plan and was able to repeat key elements of the plan. All questions were answered to their satisfaction.   I, Gwynneth Aliment, MD, have reviewed all documentation for this visit. The documentation on 09/23/22 for the exam, diagnosis, procedures, and orders are all accurate and complete.  IF YOU HAVE BEEN REFERRED TO A SPECIALIST, IT MAY TAKE 1-2 WEEKS TO SCHEDULE/PROCESS THE REFERRAL. IF YOU HAVE NOT HEARD FROM US/SPECIALIST IN TWO WEEKS, PLEASE GIVE [place] A CALL AT [phone].   THE PATIENT IS ENCOURAGED TO PRACTICE SOCIAL DISTANCING DUE TO THE COVID-19 PANDEMIC.

## 2022-09-23 NOTE — Patient Instructions (Addendum)
Coronary Calcium Scan A coronary calcium scan is an imaging test used to look for deposits of plaque in the inner lining of the blood vessels of the heart (coronary arteries). Plaque is made up of calcium, protein, and fatty substances. These deposits of plaque can partly clog and narrow the coronary arteries without producing any symptoms or warning signs. This puts a person at risk for a heart attack. A coronary calcium scan is performed using a computed tomography (CT) scanner machine without using a dye (contrast). This test is recommended for people who are at moderate risk for heart disease. The test can find plaque deposits before symptoms develop. Tell a health care provider about: Any allergies you have. All medicines you are taking, including vitamins, herbs, eye drops, creams, and over-the-counter medicines. Any problems you or family members have had with anesthetic medicines. Any bleeding problems you have. Any surgeries you have had. Any medical conditions you have. Whether you are pregnant or may be pregnant. What are the risks? Generally, this is a safe procedure. However, problems may occur, including: Harm to a pregnant woman and her unborn baby. This test involves the use of radiation. Radiation exposure can be dangerous to a pregnant woman and her unborn baby. If you are pregnant or think you may be pregnant, you should not have this procedure done. A slight increase in the risk of cancer. This is because of the radiation involved in the test. The amount of radiation from one test is similar to the amount of radiation you are naturally exposed to over one year. What happens before the procedure? Ask your health care provider for any specific instructions on how to prepare for this procedure. You may be asked to avoid products that contain caffeine, tobacco, or nicotine for 4 hours before the procedure. What happens during the procedure?  You will undress and remove any jewelry  from your neck or chest. You may need to remove hearing aides and dentures. Women may need to remove their bras. You will put on a hospital gown. Sticky electrodes will be placed on your chest. The electrodes will be connected to an electrocardiogram (ECG) machine to record a tracing of the electrical activity of your heart. You will lie down on your back on a curved bed that is attached to the CT scanner. You may be given medicine to slow down your heart rate so that clear pictures can be created. You will be moved into the CT scanner, and the CT scanner will take pictures of your heart. During this time, you will be asked to lie still and hold your breath for 10-20 seconds at a time while each picture of your heart is being taken. The procedure may vary among health care providers and hospitals. What can I expect after the procedure? You can return to your normal activities. It is up to you to get the results of your procedure. Ask your health care provider, or the department that is doing the procedure, when your results will be ready. Summary A coronary calcium scan is an imaging test used to look for deposits of plaque in the inner lining of the blood vessels of the heart. Plaque is made up of calcium, protein, and fatty substances. A coronary calcium scan is performed using a CT scanner machine without contrast. Generally, this is a safe procedure. Tell your health care provider if you are pregnant or may be pregnant. Ask your health care provider for any specific instructions on how to   prepare for this procedure. You can return to your normal activities after the scan is done. This information is not intended to replace advice given to you by your health care provider. Make sure you discuss any questions you have with your health care provider. Document Revised: 03/31/2021 Document Reviewed: 03/31/2021 Elsevier Patient Education  2023 Elsevier Inc.   Hypertension, Adult Hypertension is  another name for high blood pressure. High blood pressure forces your heart to work harder to pump blood. This can cause problems over time. There are two numbers in a blood pressure reading. There is a top number (systolic) over a bottom number (diastolic). It is best to have a blood pressure that is below 120/80. What are the causes? The cause of this condition is not known. Some other conditions can lead to high blood pressure. What increases the risk? Some lifestyle factors can make you more likely to develop high blood pressure: Smoking. Not getting enough exercise or physical activity. Being overweight. Having too much fat, sugar, calories, or salt (sodium) in your diet. Drinking too much alcohol. Other risk factors include: Having any of these conditions: Heart disease. Diabetes. High cholesterol. Kidney disease. Obstructive sleep apnea. Having a family history of high blood pressure and high cholesterol. Age. The risk increases with age. Stress. What are the signs or symptoms? High blood pressure may not cause symptoms. Very high blood pressure (hypertensive crisis) may cause: Headache. Fast or uneven heartbeats (palpitations). Shortness of breath. Nosebleed. Vomiting or feeling like you may vomit (nauseous). Changes in how you see. Very bad chest pain. Feeling dizzy. Seizures. How is this treated? This condition is treated by making healthy lifestyle changes, such as: Eating healthy foods. Exercising more. Drinking less alcohol. Your doctor may prescribe medicine if lifestyle changes do not help enough and if: Your top number is above 130. Your bottom number is above 80. Your personal target blood pressure may vary. Follow these instructions at home: Eating and drinking  If told, follow the DASH eating plan. To follow this plan: Fill one half of your plate at each meal with fruits and vegetables. Fill one fourth of your plate at each meal with whole grains.  Whole grains include whole-wheat pasta, brown rice, and whole-grain bread. Eat or drink low-fat dairy products, such as skim milk or low-fat yogurt. Fill one fourth of your plate at each meal with low-fat (lean) proteins. Low-fat proteins include fish, chicken without skin, eggs, beans, and tofu. Avoid fatty meat, cured and processed meat, or chicken with skin. Avoid pre-made or processed food. Limit the amount of salt in your diet to less than 1,500 mg each day. Do not drink alcohol if: Your doctor tells you not to drink. You are pregnant, may be pregnant, or are planning to become pregnant. If you drink alcohol: Limit how much you have to: 0-1 drink a day for women. 0-2 drinks a day for men. Know how much alcohol is in your drink. In the [place] one drink equals one 12 oz bottle of beer (355 mL), one 5 oz glass of wine (148 mL), or one 1 oz glass of hard liquor (44 mL). Lifestyle  Work with your doctor to stay at a healthy weight or to lose weight. Ask your doctor what the best weight is for you. Get at least 30 minutes of exercise that causes your heart to beat faster (aerobic exercise) most days of the week. This may include walking, swimming, or biking. Get at least 30 minutes of exercise   that strengthens your muscles (resistance exercise) at least 3 days a week. This may include lifting weights or doing Pilates. Do not smoke or use any products that contain nicotine or tobacco. If you need help quitting, ask your doctor. Check your blood pressure at home as told by your doctor. Keep all follow-up visits. Medicines Take over-the-counter and prescription medicines only as told by your doctor. Follow directions carefully. Do not skip doses of blood pressure medicine. The medicine does not work as well if you skip doses. Skipping doses also puts you at risk for problems. Ask your doctor about side effects or reactions to medicines that you should watch for. Contact a doctor if: You think  you are having a reaction to the medicine you are taking. You have headaches that keep coming back. You feel dizzy. You have swelling in your ankles. You have trouble with your vision. Get help right away if: You get a very bad headache. You start to feel mixed up (confused). You feel weak or numb. You feel faint. You have very bad pain in your: Chest. Belly (abdomen). You vomit more than once. You have trouble breathing. These symptoms may be an emergency. Get help right away. Call 911. Do not wait to see if the symptoms will go away. Do not drive yourself to the hospital. Summary Hypertension is another name for high blood pressure. High blood pressure forces your heart to work harder to pump blood. For most people, a normal blood pressure is less than 120/80. Making healthy choices can help lower blood pressure. If your blood pressure does not get lower with healthy choices, you may need to take medicine. This information is not intended to replace advice given to you by your health care provider. Make sure you discuss any questions you have with your health care provider. Document Revised: 02/07/2021 Document Reviewed: 02/07/2021 Elsevier Patient Education  2023 Elsevier Inc.  

## 2022-09-24 LAB — CMP14+EGFR
ALT: 22 IU/L (ref 0–32)
AST: 19 IU/L (ref 0–40)
Albumin/Globulin Ratio: 1.4 (ref 1.2–2.2)
Albumin: 4.4 g/dL (ref 3.8–4.9)
Alkaline Phosphatase: 94 IU/L (ref 44–121)
BUN/Creatinine Ratio: 20 (ref 9–23)
BUN: 15 mg/dL (ref 6–24)
Bilirubin Total: <0.2 mg/dL (ref 0.0–1.2)
CO2: 25 mmol/L (ref 20–29)
Calcium: 9.6 mg/dL (ref 8.7–10.2)
Chloride: 103 mmol/L (ref 96–106)
Creatinine, Ser: 0.76 mg/dL (ref 0.57–1.00)
Globulin, Total: 3.1 g/dL (ref 1.5–4.5)
Glucose: 89 mg/dL (ref 70–99)
Potassium: 4.1 mmol/L (ref 3.5–5.2)
Sodium: 140 mmol/L (ref 134–144)
Total Protein: 7.5 g/dL (ref 6.0–8.5)
eGFR: 95 mL/min/{1.73_m2} (ref >59)

## 2022-09-24 LAB — HEMOGLOBIN A1C
Est. average glucose Bld gHb Est-mCnc: 128 mg/dL
Hgb A1c MFr Bld: 6.1 % — ABNORMAL HIGH (ref 4.8–5.6)

## 2022-09-28 ENCOUNTER — Encounter: Payer: Self-pay | Admitting: Internal Medicine

## 2022-11-04 ENCOUNTER — Ambulatory Visit
Admission: RE | Admit: 2022-11-04 | Discharge: 2022-11-04 | Disposition: A | Discharge status: Home / Self Care | Payer: Medicaid Other | Source: Ambulatory Visit | Attending: Internal Medicine | Admitting: Internal Medicine

## 2022-11-04 DIAGNOSIS — F17200 Nicotine dependence, unspecified, uncomplicated: Secondary | ICD-10-CM

## 2022-11-05 IMAGING — US US THYROID
1 series · 13 of 25 positions shown · non-contrast
Comparison: None.

CLINICAL DATA: Goiter

EXAM:
THYROID ULTRASOUND
TECHNIQUE: Ultrasound examination of the thyroid gland and adjacent soft
tissues was performed.

[Series 1: us thyroid · 0.05mm/px · 13 of 39 slices shown]
[im 1/39]
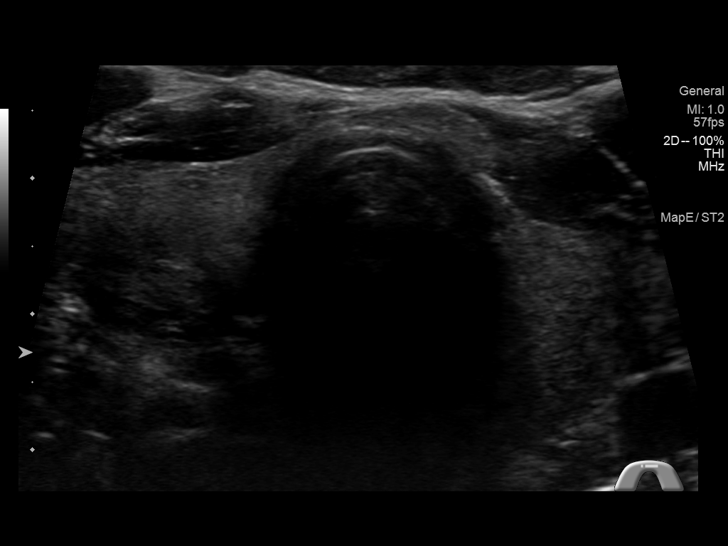
[im 4/39]
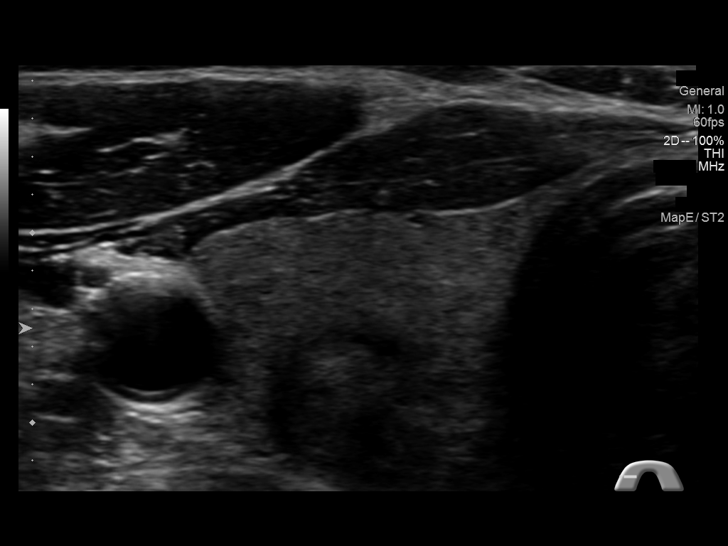
[im 7/39]
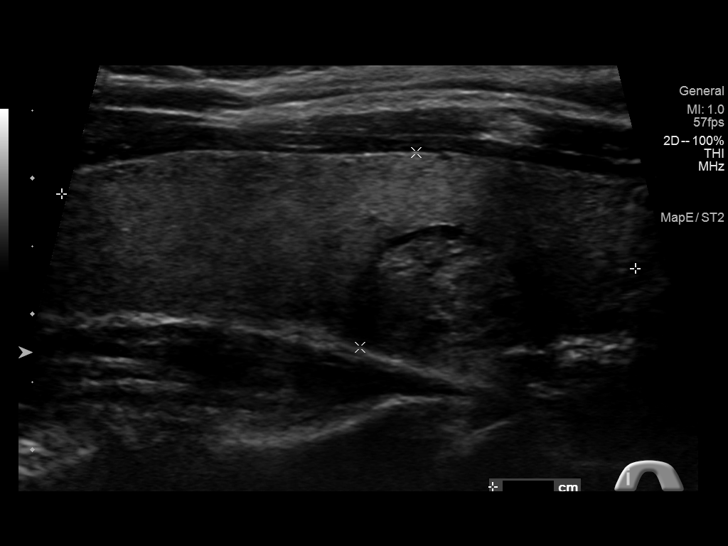
[im 10/39]
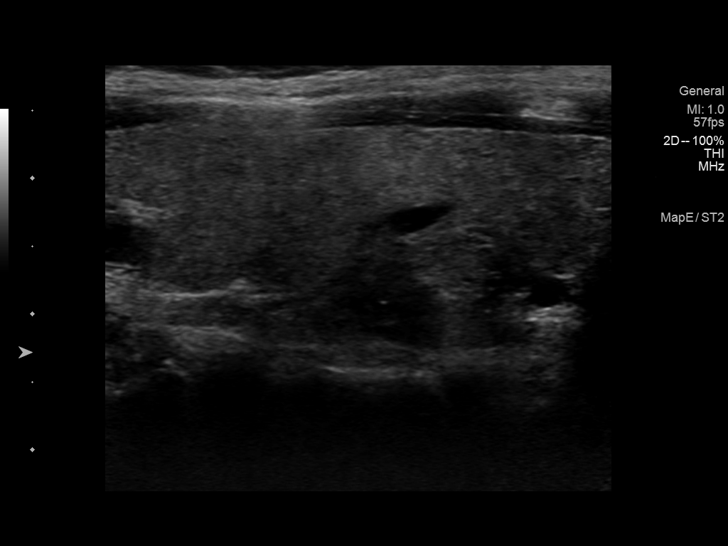
[im 13/39]
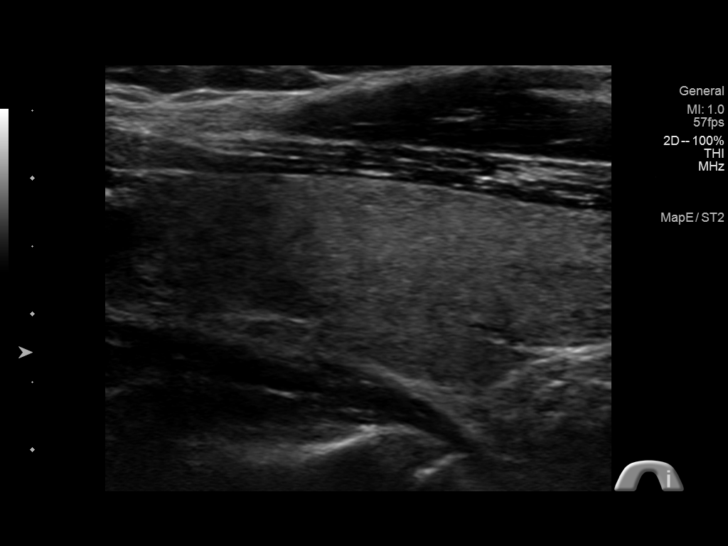
[im 16/39]
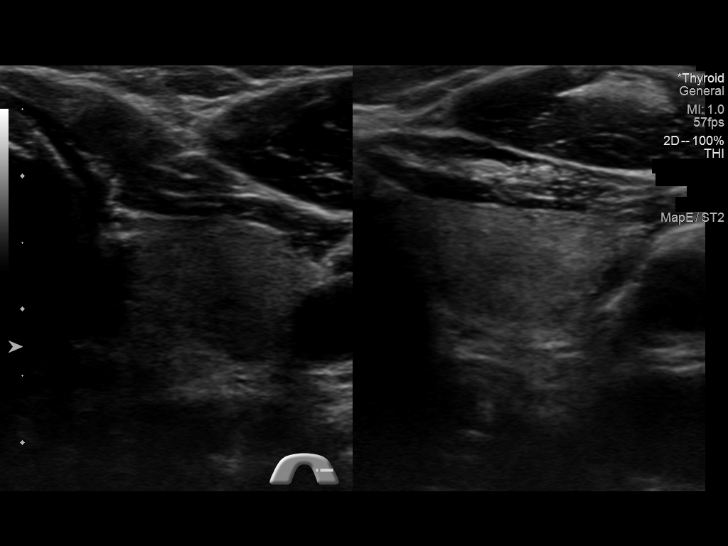
[im 20/39]
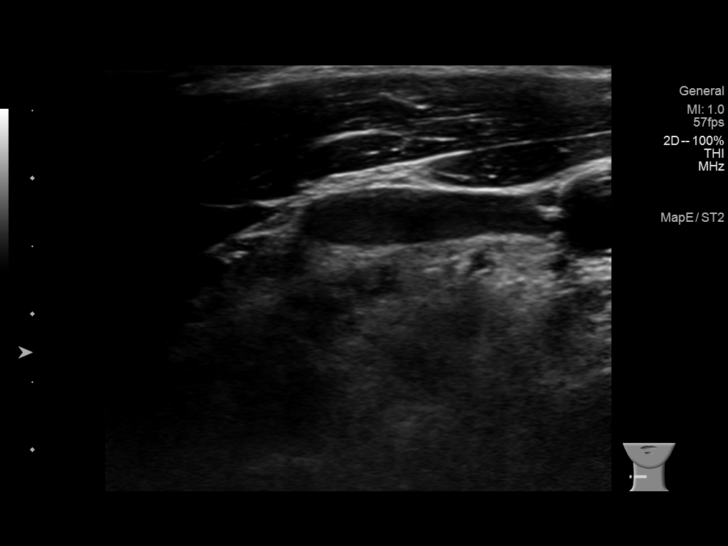
[im 23/39]
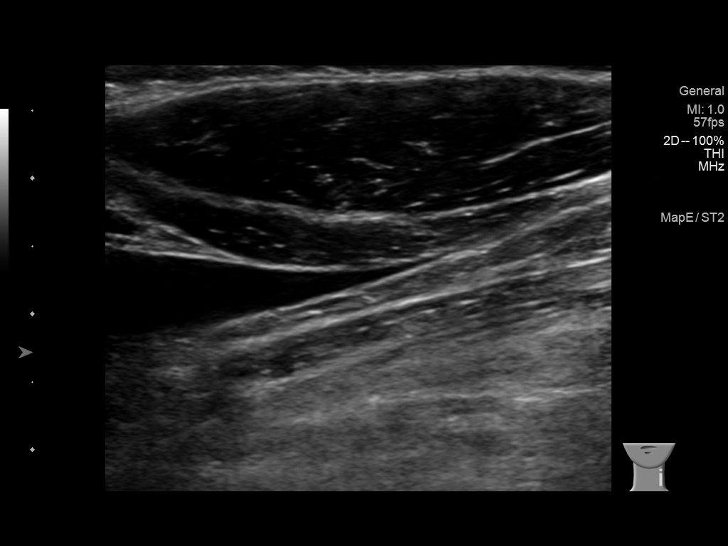
[im 26/39]
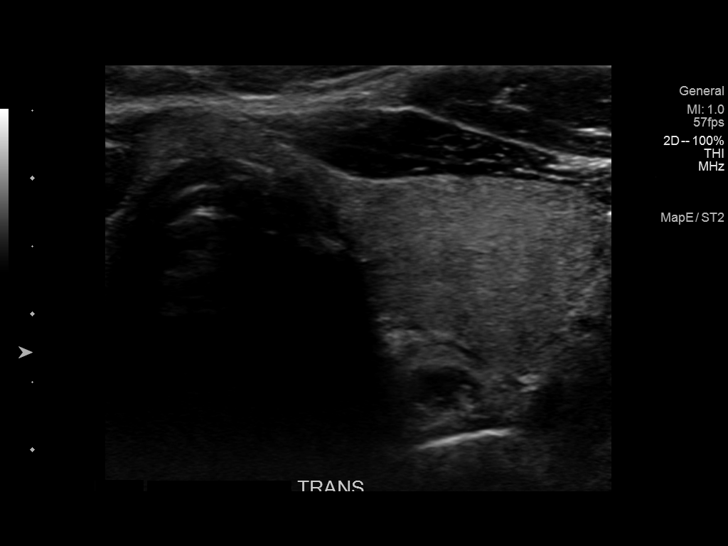
[im 29/39]
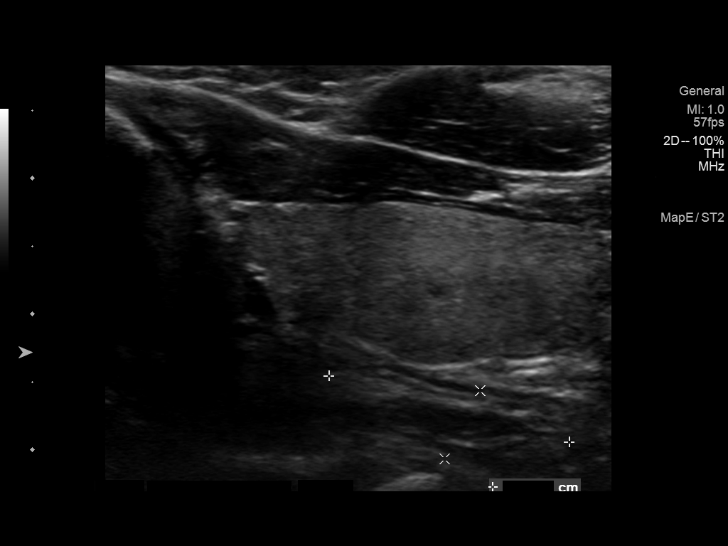
[im 32/39]
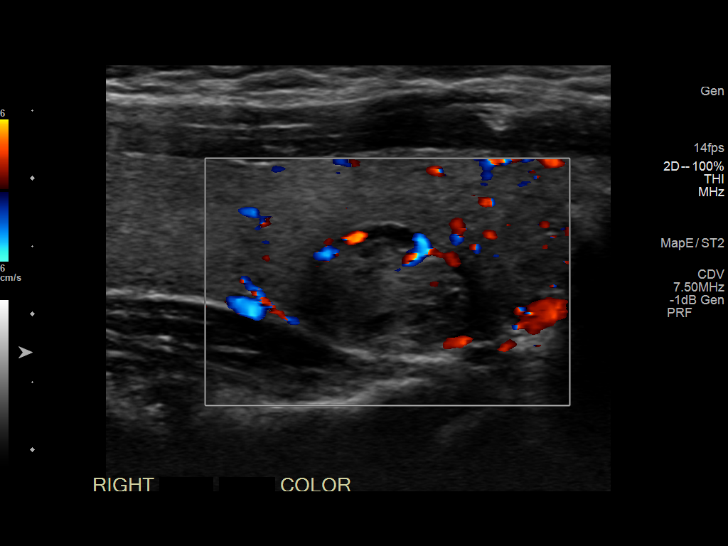
[im 35/39]
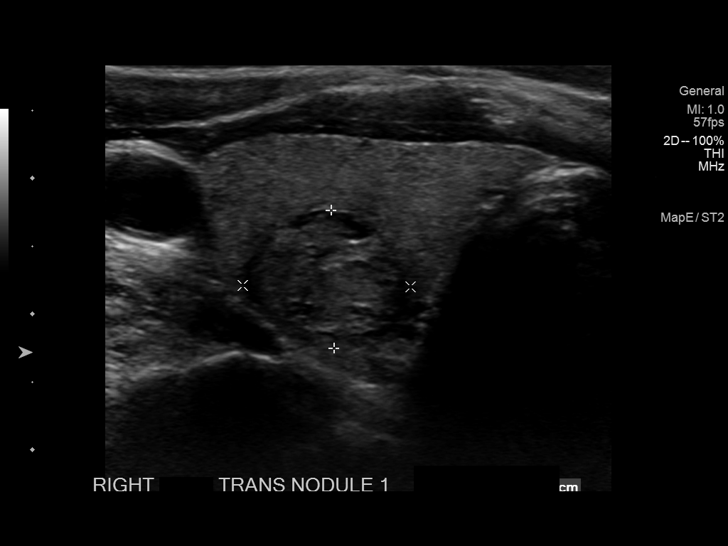
[im 39/39]
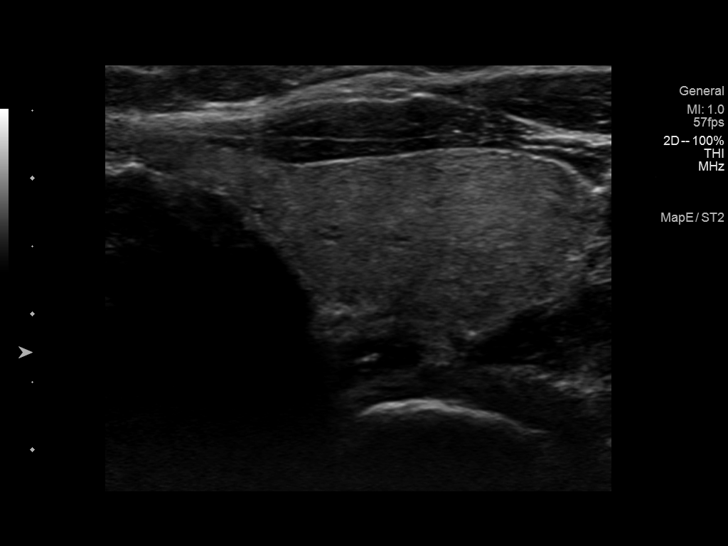

[13 of 25 positions shown; findings below may reference images not displayed]

FINDINGS: Parenchymal Echotexture: Mildly heterogenous

Isthmus: 0.2 cm

Right lobe: 4.2 x 1.4 x 1.7 cm

Left lobe: 3.8 x 1.3 x 2.0 cm

_________________________________________________________

Estimated total number of nodules >/= 1 cm: 1

Number of spongiform nodules >/=  2 cm not described below (TR1): 0

Number of mixed cystic and solid nodules >/= 1.5 cm not described
below (TR2): 0

_________________________________________________________

Nodule # 1:

Location: Right; inferior

Maximum size: 1.4 cm; Other 2 dimensions: 1.2 x 1.0 cm

Composition: solid/almost completely solid (2)

Echogenicity: isoechoic (1)

Shape: not taller-than-wide (0)

Margins: ill-defined (0)

Echogenic foci: none (0)

ACR TI-RADS total points: 3.

ACR TI-RADS risk category: TR3 (3 points).

ACR TI-RADS recommendations:

Given size (<1.4 cm) and appearance, this nodule does NOT meet
TI-RADS criteria for biopsy or dedicated follow-up.

_________________________________________________________

Esophagus seen posterior to the left thyroid lobe.
IMPRESSION: Solitary right inferior thyroid nodule does not meet criteria for
imaging surveillance or FNA.

The above is in keeping with the ACR TI-RADS recommendations - [HOSPITAL] 2533;[DATE].

## 2022-11-11 ENCOUNTER — Other Ambulatory Visit: Payer: Self-pay | Admitting: Internal Medicine

## 2022-11-11 DIAGNOSIS — J4520 Mild intermittent asthma, uncomplicated: Secondary | ICD-10-CM

## 2022-11-27 ENCOUNTER — Other Ambulatory Visit: Payer: Self-pay | Admitting: Internal Medicine

## 2023-02-15 ENCOUNTER — Telehealth: Payer: Medicaid Other | Admitting: Nurse Practitioner

## 2023-02-15 DIAGNOSIS — U071 COVID-19: Secondary | ICD-10-CM

## 2023-02-15 MED ORDER — NIRMATRELVIR/RITONAVIR (PAXLOVID)TABLET: 3.0000 | ORAL_TABLET | Freq: Two times a day (BID) | ORAL | 0 refills | Status: AC

## 2023-02-15 MED ORDER — PREDNISONE 20 MG PO TABS: 40.0000 mg | ORAL_TABLET | Freq: Every day | ORAL | 0 refills | Status: AC

## 2023-02-15 MED ORDER — PROMETHAZINE-DM 6.25-15 MG/5ML PO SYRP: 5.0000 mL | ORAL_SOLUTION | Freq: Four times a day (QID) | ORAL | 0 refills | Status: DC | PRN

## 2023-02-15 NOTE — Progress Notes (Signed)
Virtual Visit Consent   Lisa Nash, you are scheduled for a virtual visit with a [facility] provider today. Just as with appointments in the office, your consent must be obtained to participate. Your consent will be active for this visit and any virtual visit you may have with one of our providers in the next 365 days. If you have a MyChart account, a copy of this consent can be sent to you electronically.  As this is a virtual visit, video technology does not allow for your provider to perform a traditional examination. This may limit your provider's ability to fully assess your condition. If your provider identifies any concerns that need to be evaluated in person or the need to arrange testing (such as labs, EKG, etc.), we will make arrangements to do so. Although advances in technology are sophisticated, we cannot ensure that it will always work on either your end or our end. If the connection with a video visit is poor, the visit may have to be switched to a telephone visit. With either a video or telephone visit, we are not always able to ensure that we have a secure connection.  By engaging in this virtual visit, you consent to the provision of healthcare and authorize for your insurance to be billed (if applicable) for the services provided during this visit. Depending on your insurance coverage, you may receive a charge related to this service.  I need to obtain your verbal consent now. Are you willing to proceed with your visit today? Analy L Scoles has provided verbal consent on 02/15/2023 for a virtual visit (video or telephone). Claiborne Rigg, NP  Date: 02/15/2023 4:44 PM  Virtual Visit via Video Note   I, Claiborne Rigg, connected with  Lisa Nash  ([id number], 01/27/1971) on 02/15/23 at  4:30 PM EDT by a video-enabled telemedicine application and verified that I am speaking with the correct person using two identifiers.  Location: Patient: Virtual Visit  Location Patient: Home Provider: Virtual Visit Location Provider: Home Office   I discussed the limitations of evaluation and management by telemedicine and the availability of in person appointments. The patient expressed understanding and agreed to proceed.    History of Present Illness: Lisa Nash is a 52 y.o. who identifies as a female who was assigned female at birth, and is being seen today for COVID POSITIVE.  Lisa Nash took a COVID test today and it was positive. She is currently experiencing the following symptoms since yesterday: fatigue, body aches, cough, congestion, headache. She is a current smoker.   Problems:  Patient Active Problem List   Diagnosis Date Noted   Impacted cerumen of right ear 03/30/2022   Postnasal drip 03/25/2022   Pure hypercholesterolemia 03/25/2022   Goiter 03/25/2022   Mild intermittent asthma without complication 03/25/2022   Vitamin D deficiency disease 03/25/2022   Class 2 obesity due to excess calories without serious comorbidity with body mass index (BMI) of 35.0 to 35.9 in adult 03/25/2022   Traction alopecia 02/07/2021   Grief at loss of child 11/16/2019   Adjustment disorder with depressed mood 11/16/2019   Hair loss 11/16/2019   Essential hypertension, benign 11/16/2019   Fibroids 05/24/2019   Hypertension 05/24/2019   Obesity, Class I, BMI 30-34.9 05/24/2019   SUI (stress urinary incontinence, female) 05/24/2019   Closed displaced fracture of proximal phalanx of left little finger 10/27/2018   Bruising 03/25/2014   Eczema     Allergies:  Allergies  Allergen Reactions   Bee Pollen Anaphylaxis   Adhesive [Tape]    Pollen Extract    Medications:  Current Outpatient Medications:    nirmatrelvir/ritonavir (PAXLOVID) 20 x 150 MG & 10 x 100MG  TABS, Take 3 tablets by mouth 2 (two) times daily for 5 days. (Take nirmatrelvir 150 mg two tablets twice daily for 5 days and ritonavir 100 mg one tablet twice daily for 5 days)  Patient GFR is 95, Disp: 30 tablet, Rfl: 0   predniSONE (DELTASONE) 20 MG tablet, Take 2 tablets (40 mg total) by mouth daily with breakfast for 7 days., Disp: 14 tablet, Rfl: 0   promethazine-dextromethorphan (PROMETHAZINE-DM) 6.25-15 MG/5ML syrup, Take 5 mLs by mouth 4 (four) times daily as needed for cough., Disp: 240 mL, Rfl: 0   albuterol (VENTOLIN HFA) 108 (90 Base) MCG/ACT inhaler, Inhale 2 puffs into the lungs every 6 (six) hours as needed for wheezing or shortness of breath., Disp: 18 g, Rfl: 1   clobetasol (TEMOVATE) 0.05 % external solution, Apply topically., Disp: , Rfl:    clobetasol ointment (TEMOVATE) 0.05 %, Apply to scalp 3-4 times per week. Not to face., Disp: , Rfl:    EPINEPHrine 0.3 mg/0.3 mL IJ SOAJ injection, Inject 0.3 mg into the muscle as needed for anaphylaxis., Disp: 1 each, Rfl: 0   hydrochlorothiazide (MICROZIDE) 12.5 MG capsule, TAKE 1 CAPSULE(12.5 MG) BY MOUTH DAILY, Disp: 90 capsule, Rfl: 2   minoxidil (LONITEN) 2.5 MG tablet, Take 1/2 tab by mouth daily, Disp: , Rfl:    montelukast (SINGULAIR) 10 MG tablet, TAKE 1 TABLET BY MOUTH EVERY DAY IN THE EVENING, Disp: 90 tablet, Rfl: 3   nicotine (NICODERM CQ - DOSED IN MG/24 HOURS) 21 mg/24hr patch, Place 1 patch (21 mg total) onto the skin daily., Disp: 30 patch, Rfl: 0  Observations/Objective: Patient is well-developed, well-nourished in no acute distress.  Resting comfortably  at home.  Head is normocephalic, atraumatic.  No labored breathing.  Speech is clear and coherent with logical content.  Patient is alert and oriented at baseline.    Assessment and Plan: 1. Positive self-administered antigen test for COVID-19 - nirmatrelvir/ritonavir (PAXLOVID) 20 x 150 MG & 10 x 100MG  TABS; Take 3 tablets by mouth 2 (two) times daily for 5 days. (Take nirmatrelvir 150 mg two tablets twice daily for 5 days and ritonavir 100 mg one tablet twice daily for 5 days) Patient GFR is 95  Dispense: 30 tablet; Refill: 0 -  predniSONE (DELTASONE) 20 MG tablet; Take 2 tablets (40 mg total) by mouth daily with breakfast for 7 days.  Dispense: 14 tablet; Refill: 0 - promethazine-dextromethorphan (PROMETHAZINE-DM) 6.25-15 MG/5ML syrup; Take 5 mLs by mouth 4 (four) times daily as needed for cough.  Dispense: 240 mL; Refill: 0   Please keep well-hydrated and get plenty of rest. Start a saline nasal rinse to flush out your nasal passages. You can use plain Mucinex to help thin congestion. If you have a humidifier, you can use this daily as needed.    You are to wear a mask for 5 days from onset of your symptoms.  After day 5, if you have had no fever and you are feeling better with NO symptoms, you can end masking. Keep in mind you can be contagious 10 days from the onset of symptoms  After day 5 if you have a fever or are having significant symptoms, please wear your mask for full 10 days.   If you note any worsening of symptoms,  any significant shortness of breath or any chest pain, please seek ER evaluation ASAP.  Please do not delay care!    If you note any worsening of symptoms, any significant shortness of breath or any chest pain, please seek ER evaluation ASAP.  Please do not delay care!   Follow Up Instructions: I discussed the assessment and treatment plan with the patient. The patient was provided an opportunity to ask questions and all were answered. The patient agreed with the plan and demonstrated an understanding of the instructions.  A copy of instructions were sent to the patient via MyChart unless otherwise noted below.    The patient was advised to call back or seek an in-person evaluation if the symptoms worsen or if the condition fails to improve as anticipated.    Claiborne Rigg, NP

## 2023-02-15 NOTE — Patient Instructions (Signed)
Lisa Nash, thank you for joining Claiborne Rigg, NP for today's virtual visit.  While this provider is not your primary care provider (PCP), if your PCP is located in our provider database this encounter information will be shared with them immediately following your visit.   A [facility] MyChart account gives you access to today's visit and all your visits, tests, and labs performed at [facility] " click here if you don't have a [facility] MyChart account or go to [email or web address]  Consent: (Patient) Lisa Nash provided verbal consent for this virtual visit at the beginning of the encounter.  Current Medications:  Current Outpatient Medications:    nirmatrelvir/ritonavir (PAXLOVID) 20 x 150 MG & 10 x 100MG  TABS, Take 3 tablets by mouth 2 (two) times daily for 5 days. (Take nirmatrelvir 150 mg two tablets twice daily for 5 days and ritonavir 100 mg one tablet twice daily for 5 days) Patient GFR is 95, Disp: 30 tablet, Rfl: 0   predniSONE (DELTASONE) 20 MG tablet, Take 2 tablets (40 mg total) by mouth daily with breakfast for 7 days., Disp: 14 tablet, Rfl: 0   promethazine-dextromethorphan (PROMETHAZINE-DM) 6.25-15 MG/5ML syrup, Take 5 mLs by mouth 4 (four) times daily as needed for cough., Disp: 240 mL, Rfl: 0   albuterol (VENTOLIN HFA) 108 (90 Base) MCG/ACT inhaler, Inhale 2 puffs into the lungs every 6 (six) hours as needed for wheezing or shortness of breath., Disp: 18 g, Rfl: 1   clobetasol (TEMOVATE) 0.05 % external solution, Apply topically., Disp: , Rfl:    clobetasol ointment (TEMOVATE) 0.05 %, Apply to scalp 3-4 times per week. Not to face., Disp: , Rfl:    EPINEPHrine 0.3 mg/0.3 mL IJ SOAJ injection, Inject 0.3 mg into the muscle as needed for anaphylaxis., Disp: 1 each, Rfl: 0   hydrochlorothiazide (MICROZIDE) 12.5 MG capsule, TAKE 1 CAPSULE(12.5 MG) BY MOUTH DAILY, Disp: 90 capsule, Rfl: 2   minoxidil (LONITEN) 2.5 MG tablet, Take 1/2 tab  by mouth daily, Disp: , Rfl:    montelukast (SINGULAIR) 10 MG tablet, TAKE 1 TABLET BY MOUTH EVERY DAY IN THE EVENING, Disp: 90 tablet, Rfl: 3   nicotine (NICODERM CQ - DOSED IN MG/24 HOURS) 21 mg/24hr patch, Place 1 patch (21 mg total) onto the skin daily., Disp: 30 patch, Rfl: 0   Medications ordered in this encounter:  Meds ordered this encounter  Medications   nirmatrelvir/ritonavir (PAXLOVID) 20 x 150 MG & 10 x 100MG  TABS    Sig: Take 3 tablets by mouth 2 (two) times daily for 5 days. (Take nirmatrelvir 150 mg two tablets twice daily for 5 days and ritonavir 100 mg one tablet twice daily for 5 days) Patient GFR is 95    Dispense:  30 tablet    Refill:  0    Order Specific Question:   Supervising Provider    Answer:   Merrilee Jansky [[id number]]   predniSONE (DELTASONE) 20 MG tablet    Sig: Take 2 tablets (40 mg total) by mouth daily with breakfast for 7 days.    Dispense:  14 tablet    Refill:  0    Order Specific Question:   Supervising Provider    Answer:   Merrilee Jansky [id number]   promethazine-dextromethorphan (PROMETHAZINE-DM) 6.25-15 MG/5ML syrup    Sig: Take 5 mLs by mouth 4 (four) times daily as needed for cough.    Dispense:  240 mL    Refill:  0  Order Specific Question:   Supervising Provider    Answer:   Merrilee Jansky [[id number]]     *If you need refills on other medications prior to your next appointment, please contact your pharmacy*  Follow-Up: Call back or seek an in-person evaluation if the symptoms worsen or if the condition fails to improve as anticipated.  [facility] [phone]  Other Instructions  Please keep well-hydrated and get plenty of rest. Start a saline nasal rinse to flush out your nasal passages. You can use plain Mucinex to help thin congestion. If you have a humidifier, you can use this daily as needed.    You are to wear a mask for 5 days from onset of your symptoms.  After day 5, if you have had no fever and  you are feeling better with NO symptoms, you can end masking. Keep in mind you can be contagious 10 days from the onset of symptoms  After day 5 if you have a fever or are having significant symptoms, please wear your mask for full 10 days.   If you note any worsening of symptoms, any significant shortness of breath or any chest pain, please seek ER evaluation ASAP.  Please do not delay care!    If you note any worsening of symptoms, any significant shortness of breath or any chest pain, please seek ER evaluation ASAP.  Please do not delay care!    If you have been instructed to have an in-person evaluation today at a local Urgent Care facility, please use the link below. It will take you to a list of all of our available [facility], including address, phone number and hours of operation. Please do not delay care.  [facility]  If you or a family member do not have a primary care provider, use the link below to schedule a visit and establish care. When you choose a [facility] primary care physician or advanced practice provider, you gain a long-term partner in health. Find a Primary Care Provider  Learn more about [facility]'s in-office and virtual care options: [facility] - Get Care Now

## 2023-03-20 ENCOUNTER — Other Ambulatory Visit: Payer: Self-pay

## 2023-03-20 ENCOUNTER — Emergency Department (HOSPITAL_BASED_OUTPATIENT_CLINIC_OR_DEPARTMENT_OTHER): Payer: Medicaid Other | Admitting: Radiology

## 2023-03-20 ENCOUNTER — Emergency Department (HOSPITAL_BASED_OUTPATIENT_CLINIC_OR_DEPARTMENT_OTHER)
Admission: EM | Admit: 2023-03-20 | Discharge: 2023-03-20 | Disposition: A | Discharge status: Home / Self Care | Payer: Medicaid Other

## 2023-03-20 DIAGNOSIS — R0789 Other chest pain: Secondary | ICD-10-CM | POA: Insufficient documentation

## 2023-03-20 DIAGNOSIS — Z8616 Personal history of COVID-19: Secondary | ICD-10-CM | POA: Diagnosis not present

## 2023-03-20 LAB — TROPONIN I (HIGH SENSITIVITY)
Troponin I (High Sensitivity): 3 ng/L (ref <18)
Troponin I (High Sensitivity): 3 ng/L (ref <18)

## 2023-03-20 LAB — BASIC METABOLIC PANEL
Anion gap: 7 (ref 5–15)
BUN: 14 mg/dL (ref 6–20)
CO2: 27 mmol/L (ref 22–32)
Calcium: 9.2 mg/dL (ref 8.9–10.3)
Chloride: 101 mmol/L (ref 98–111)
Creatinine, Ser: 0.77 mg/dL (ref 0.44–1.00)
GFR, Estimated: >60 mL/min (ref >60)
Glucose, Bld: 97 mg/dL (ref 70–99)
Potassium: 3.3 mmol/L — ABNORMAL LOW (ref 3.5–5.1)
Sodium: 135 mmol/L (ref 135–145)

## 2023-03-20 LAB — CBC
HCT: 36.6 % (ref 36.0–46.0)
Hemoglobin: 12.4 g/dL (ref 12.0–15.0)
MCH: 29.2 pg (ref 26.0–34.0)
MCHC: 33.9 g/dL (ref 30.0–36.0)
MCV: 86.3 fL (ref 80.0–100.0)
Platelets: 335 10*3/uL (ref 150–400)
RBC: 4.24 MIL/uL (ref 3.87–5.11)
RDW: 13.7 % (ref 11.5–15.5)
WBC: 10.1 10*3/uL (ref 4.0–10.5)
nRBC: 0 % (ref 0.0–0.2)

## 2023-03-20 LAB — D-DIMER, QUANTITATIVE: D-Dimer, Quant: 0.52 ug{FEU}/mL — ABNORMAL HIGH (ref 0.00–0.50)

## 2023-03-20 MED ORDER — IBUPROFEN 400 MG PO TABS
600.0000 mg | ORAL_TABLET | Freq: Once | ORAL | Status: AC
  Administered 2023-03-20: 600 mg via ORAL
  Filled 2023-03-20: qty 1

## 2023-03-20 MED ORDER — METHOCARBAMOL 500 MG PO TABS: 500.0000 mg | ORAL_TABLET | Freq: Three times a day (TID) | ORAL | 0 refills | Status: AC | PRN

## 2023-03-20 MED ORDER — ACETAMINOPHEN 325 MG PO TABS
650.0000 mg | ORAL_TABLET | Freq: Once | ORAL | Status: AC
  Administered 2023-03-20: 650 mg via ORAL
  Filled 2023-03-20: qty 2

## 2023-03-20 MED ORDER — LIDOCAINE 4 % EX PTCH: 1.0000 | MEDICATED_PATCH | CUTANEOUS | 0 refills | Status: AC

## 2023-03-20 MED ORDER — LIDOCAINE 5 % EX PTCH
1.0000 | MEDICATED_PATCH | Freq: Once | CUTANEOUS | Status: DC
  Administered 2023-03-20: 1 via TRANSDERMAL
  Filled 2023-03-20: qty 1

## 2023-03-20 NOTE — ED Triage Notes (Signed)
Right sided CP several days. Pain with breathing. Denies cough fever. Has gone back and forth between left and right chest.

## 2023-03-20 NOTE — Discharge Instructions (Addendum)
Please take over-the-counter Tylenol and Motrin for pain.  We also prescribed you medications which may also use.  Please follow-up with primary doctor.  If you have any new or worsening symptoms please return to the emergency department.

## 2023-03-20 NOTE — ED Provider Notes (Signed)
[place] EMERGENCY DEPARTMENT AT [facility] Provider Note   CSN: [id number] Arrival date & time: 03/20/23  1532     History  Chief Complaint  Patient presents with   Chest Pain    Lisa Nash is a 52 y.o. female.  52 year old female present emergency department for chest pain.  Reports having COVID several weeks ago, had cough.  Has had chest pain for the past several days.  Right sided.  Described as sharp.  Worse with movement and inspiration.  Tender to palpation.  Low risk for PE based on Wells criteria   Chest Pain      Home Medications Prior to Admission medications   Medication Sig Start Date End Date Taking? Authorizing Provider  lidocaine (HM LIDOCAINE PATCH) 4 % Place 1 patch onto the skin daily for 10 days. 03/20/23 03/30/23 Yes Markiah Janeway, Harmon Dun, DO  methocarbamol (ROBAXIN) 500 MG tablet Take 1 tablet (500 mg total) by mouth every 8 (eight) hours as needed for up to 3 days for muscle spasms. 03/20/23 03/23/23 Yes Gaylen Pereira, Harmon Dun, DO  albuterol (VENTOLIN HFA) 108 (90 Base) MCG/ACT inhaler Inhale 2 puffs into the lungs every 6 (six) hours as needed for wheezing or shortness of breath. 10/24/21   Dorothyann Peng, MD  clobetasol (TEMOVATE) 0.05 % external solution Apply topically. 01/28/21   [provider]  clobetasol ointment (TEMOVATE) 0.05 % Apply to scalp 3-4 times per week. Not to face. 06/03/21   [provider]  EPINEPHrine 0.3 mg/0.3 mL IJ SOAJ injection Inject 0.3 mg into the muscle as needed for anaphylaxis. 10/21/20   Raspet, Noberto Retort, PA-C  hydrochlorothiazide (MICROZIDE) 12.5 MG capsule TAKE 1 CAPSULE(12.5 MG) BY MOUTH DAILY 11/27/22   Dorothyann Peng, MD  minoxidil (LONITEN) 2.5 MG tablet Take 1/2 tab by mouth daily 02/07/21   [provider]  montelukast (SINGULAIR) 10 MG tablet TAKE 1 TABLET BY MOUTH EVERY DAY IN THE EVENING 11/12/22   Dorothyann Peng, MD  nicotine (NICODERM CQ - DOSED IN MG/24 HOURS) 21 mg/24hr patch  Place 1 patch (21 mg total) onto the skin daily. 04/07/22 04/07/23  Dorothyann Peng, MD  promethazine-dextromethorphan (PROMETHAZINE-DM) 6.25-15 MG/5ML syrup Take 5 mLs by mouth 4 (four) times daily as needed for cough. 02/15/23   Claiborne Rigg, NP      Allergies    Bee pollen, Adhesive [tape], and Pollen extract    Review of Systems   Review of Systems  Cardiovascular:  Positive for chest pain.    Physical Exam Updated Vital Signs BP 126/83   Pulse 87   Temp 98.2 F (36.8 C)   Resp 16   Ht 5\' 1"  (1.549 m)   Wt 88.5 kg   LMP  (LMP Unknown) Comment: partial hysterectomy  SpO2 93%   BMI 36.84 kg/m  Physical Exam Vitals and nursing note reviewed.  Constitutional:      General: She is not in acute distress.    Appearance: She is obese. She is not toxic-appearing.  HENT:     Head: Normocephalic.  Cardiovascular:     Rate and Rhythm: Normal rate and regular rhythm.     Pulses:          Radial pulses are 2+ on the right side and 2+ on the left side.     Heart sounds: Normal heart sounds.  Pulmonary:     Effort: Pulmonary effort is normal.     Breath sounds: Normal breath sounds.  Chest:  Chest wall: Tenderness present.  Musculoskeletal:     Right lower leg: No tenderness. No edema.     Left lower leg: No tenderness. No edema.  Skin:    General: Skin is warm and dry.     Capillary Refill: Capillary refill takes less than 2 seconds.  Neurological:     Mental Status: She is alert and oriented to person, place, and time.     ED Results / Procedures / Treatments   Labs (all labs ordered are listed, but only abnormal results are displayed) Labs Reviewed  BASIC METABOLIC PANEL - Abnormal; Notable for the following components:      Result Value   Potassium 3.3 (*)    All other components within normal limits  D-DIMER, QUANTITATIVE - Abnormal; Notable for the following components:   D-Dimer, Quant 0.52 (*)    All other components within normal limits  CBC  TROPONIN  I (HIGH SENSITIVITY)  TROPONIN I (HIGH SENSITIVITY)    EKG EKG Interpretation Date/Time:  Friday March 20 2023 15:43:49 EST Ventricular Rate:  104 PR Interval:  144 QRS Duration:  78 QT Interval:  360 QTC Calculation: 473 R Axis:   6  Text Interpretation: Sinus tachycardia Cannot rule out Anterior infarct , age undetermined Abnormal ECG When compared with ECG of 11-Sep-2017 18:01, PREVIOUS ECG IS PRESENT NO SIGNIFICANT CHANGE SINCE LAST TRACING YESTERDAY Confirmed by Estanislado Pandy [phone]) on 03/20/2023 4:58:24 PM  Radiology DG Chest 2 View  Result Date: 03/20/2023 CLINICAL DATA:  chest pain EXAM: CHEST - 2 VIEW COMPARISON:  09/11/2017. FINDINGS: Cardiac silhouette is unremarkable. No pneumothorax or pleural effusion. The lungs are clear. The visualized skeletal structures are unremarkable. IMPRESSION: No acute cardiopulmonary process. Electronically Signed   By: Layla Maw M.D.   On: 03/20/2023 18:46    Procedures Procedures    Medications Ordered in ED Medications  lidocaine (LIDODERM) 5 % 1 patch (1 patch Transdermal Patch Applied 03/20/23 1726)  acetaminophen (TYLENOL) tablet 650 mg (650 mg Oral Given 03/20/23 1726)  ibuprofen (ADVIL) tablet 600 mg (600 mg Oral Given 03/20/23 1726)    ED Course/ Medical Decision Making/ A&P Clinical Course as of 03/20/23 2353  Fri Mar 20, 2023  1725 D-Dimer, Quant(!): 0.52 Mild elevation.  Recently had COVID. History not c/w PE. No clinical signs of DVT. According to years algorithm PE excluded. [TY]  1726 Troponin I (High Sensitivity): 3 ACS less likely. [TY]  [id number] Patient is feeling better after pain medications which further points to musculoskeletal etiology.  Discussed supportive care follow-up PCP.  Stable for discharge. [TY]    Clinical Course User Index [TY] Coral Spikes, DO                                 Medical Decision Making 52 year old female presenting emergency department for chest pain.  She is afebrile,  mildly elevated heart rate, hypertensive.  Maintaining oxygen saturation on room air.  Physical exam with reproducible chest pain with palpation which would suggest a musculoskeletal etiology.  EKG without ST segment changes to indicate ischemia.  Troponin negative.  ACS unlikely.  Cannot exclude patient from PE based on Wells/PERC D-dimer obtained.  Mild elevation.  However PE is unlikely based on years criteria.  She has no significant metabolic derangements.  Normal kidney function.  No leukocytosis to suggest systemic infection.  Discussed supportive care.  Will treat for musculoskeletal chest wall pain.  Stable  for discharge.  Follow-up with primary doctor.  Amount and/or Complexity of Data Reviewed External Data Reviewed:     Details: No prior cardiac history per chart review. Labs: ordered. Decision-making details documented in ED Course. Radiology:  Decision-making details documented in ED Course. ECG/medicine tests:  Decision-making details documented in ED Course.  Risk OTC drugs. Prescription drug management. Decision regarding hospitalization. Risk Details: Do not think patient would benefit from hospitalization at this time given negative workup.          Final Clinical Impression(s) / ED Diagnoses Final diagnoses:  Chest wall pain    Rx / DC Orders ED Discharge Orders          Ordered    lidocaine (HM LIDOCAINE PATCH) 4 %  Every 24 hours        03/20/23 1854    methocarbamol (ROBAXIN) 500 MG tablet  Every 8 hours PRN        03/20/23 1854              Coral Spikes, DO 03/20/23 2353

## 2023-03-31 ENCOUNTER — Encounter: Payer: Self-pay | Admitting: Internal Medicine

## 2023-03-31 ENCOUNTER — Ambulatory Visit (INDEPENDENT_AMBULATORY_CARE_PROVIDER_SITE_OTHER): Payer: Medicaid Other | Admitting: Internal Medicine

## 2023-03-31 VITALS — BP 126/84 | HR 98 | Temp 98.2°F | Ht 61.0 in | Wt 207.2 lb

## 2023-03-31 DIAGNOSIS — I10 Essential (primary) hypertension: Secondary | ICD-10-CM | POA: Diagnosis not present

## 2023-03-31 DIAGNOSIS — R0789 Other chest pain: Secondary | ICD-10-CM | POA: Diagnosis not present

## 2023-03-31 DIAGNOSIS — E559 Vitamin D deficiency, unspecified: Secondary | ICD-10-CM

## 2023-03-31 DIAGNOSIS — Z0000 Encounter for general adult medical examination without abnormal findings: Secondary | ICD-10-CM | POA: Diagnosis not present

## 2023-03-31 DIAGNOSIS — E049 Nontoxic goiter, unspecified: Secondary | ICD-10-CM

## 2023-03-31 DIAGNOSIS — Z114 Encounter for screening for human immunodeficiency virus [HIV]: Secondary | ICD-10-CM

## 2023-03-31 DIAGNOSIS — J4520 Mild intermittent asthma, uncomplicated: Secondary | ICD-10-CM

## 2023-03-31 DIAGNOSIS — R7309 Other abnormal glucose: Secondary | ICD-10-CM | POA: Diagnosis not present

## 2023-03-31 DIAGNOSIS — E66812 Obesity, class 2: Secondary | ICD-10-CM

## 2023-03-31 DIAGNOSIS — Z6839 Body mass index (BMI) 39.0-39.9, adult: Secondary | ICD-10-CM

## 2023-03-31 DIAGNOSIS — F17200 Nicotine dependence, unspecified, uncomplicated: Secondary | ICD-10-CM

## 2023-03-31 DIAGNOSIS — Z111 Encounter for screening for respiratory tuberculosis: Secondary | ICD-10-CM

## 2023-03-31 NOTE — Progress Notes (Signed)
I,Victoria T Deloria Lair, CMA,acting as a Neurosurgeon for Gwynneth Aliment, MD.,have documented all relevant documentation on the behalf of Gwynneth Aliment, MD,as directed by  Gwynneth Aliment, MD while in the presence of Gwynneth Aliment, MD.  Subjective:    Patient ID: Lisa Nash , female    DOB: January 10, 1971 , 52 y.o.   MRN: [id number]  Chief Complaint  Patient presents with   Annual Exam   Hypertension    HPI  The patient is here today for a physical examination.  She has her pelvic exams performed by Gyn at [facility]. She reports compliance with meds. She denies headaches, chest pain and shortness of breath.       Hypertension This is a chronic problem. The current episode started more than 1 year ago. The problem has been gradually improving since onset. The problem is controlled. Pertinent negatives include no blurred vision, chest pain, palpitations or shortness of breath. The current treatment provides moderate improvement. Compliance problems include exercise.      Past Medical History:  Diagnosis Date   Arthritis    Asthma    Eczema      Family History  Problem Relation Age of Onset   Hypertension Mother    Hypertension Father    Arthritis Father    Breast cancer Neg Hx      Current Outpatient Medications:    albuterol (VENTOLIN HFA) 108 (90 Base) MCG/ACT inhaler, Inhale 2 puffs into the lungs every 6 (six) hours as needed for wheezing or shortness of breath., Disp: 18 g, Rfl: 1   clobetasol (TEMOVATE) 0.05 % external solution, Apply topically., Disp: , Rfl:    clobetasol ointment (TEMOVATE) 0.05 %, Apply to scalp 3-4 times per week. Not to face., Disp: , Rfl:    EPINEPHrine 0.3 mg/0.3 mL IJ SOAJ injection, Inject 0.3 mg into the muscle as needed for anaphylaxis., Disp: 1 each, Rfl: 0   hydrochlorothiazide (MICROZIDE) 12.5 MG capsule, TAKE 1 CAPSULE(12.5 MG) BY MOUTH DAILY, Disp: 90 capsule, Rfl: 2   minoxidil (LONITEN) 2.5 MG tablet, Take 1/2 tab by mouth daily, Disp:  , Rfl:    montelukast (SINGULAIR) 10 MG tablet, TAKE 1 TABLET BY MOUTH EVERY DAY IN THE EVENING, Disp: 90 tablet, Rfl: 3   nicotine (NICODERM CQ - DOSED IN MG/24 HOURS) 21 mg/24hr patch, Place 1 patch (21 mg total) onto the skin daily. (Patient not taking: Reported on 03/31/2023), Disp: 30 patch, Rfl: 0   promethazine-dextromethorphan (PROMETHAZINE-DM) 6.25-15 MG/5ML syrup, Take 5 mLs by mouth 4 (four) times daily as needed for cough. (Patient not taking: Reported on 03/31/2023), Disp: 240 mL, Rfl: 0   Allergies  Allergen Reactions   Bee Pollen Anaphylaxis   Adhesive [Tape]    Pollen Extract       The patient states she uses status post hysterectomy for birth control. No LMP recorded (lmp unknown). Patient has had a hysterectomy.. Negative for Dysmenorrhea. Negative for: breast discharge, breast lump(s), breast pain and breast self exam. Associated symptoms include abnormal vaginal bleeding. Pertinent negatives include abnormal bleeding (hematology), anxiety, decreased libido, depression, difficulty falling sleep, dyspareunia, history of infertility, nocturia, sexual dysfunction, sleep disturbances, urinary incontinence, urinary urgency, vaginal discharge and vaginal itching. Diet regular.The patient states her exercise level is  intermittent.  . The patient's tobacco use is:  Social History   Tobacco Use  Smoking Status Some Days   Current packs/day: 0.00   Average packs/day: 1 pack/day for 15.2 years (15.2 ttl pk-yrs)  Types: Cigarettes   Start date: 36   Last attempt to quit: 07/09/2005   Years since quitting: 17.7  Smokeless Tobacco Never  Tobacco Comments   Started smoking 1992, only here and there. In 2002- 1ppd x 5 years, Quit in 2007, started back in 2014. 1ppd x 5 years, 1/2ppd 2014x5 years,  smokes 1ppd x 9 years(greater than 20packyear history)  . She has been exposed to passive smoke. The patient's alcohol use is:  Social History   Substance and Sexual Activity  Alcohol  Use Yes   Alcohol/week: 3.0 - 4.0 standard drinks of alcohol   Types: 1 Glasses of wine, 2 - 3 Cans of beer per week   Comment: drinks beer 2-3 times per week, wine occassionally   Review of Systems  Constitutional: Negative.   HENT: Negative.    Eyes: Negative.  Negative for blurred vision.  Respiratory: Negative.  Negative for shortness of breath.   Cardiovascular: Negative.  Negative for chest pain and palpitations.  Gastrointestinal: Negative.   Endocrine: Negative.   Genitourinary: Negative.   Musculoskeletal: Negative.   Skin: Negative.   Allergic/Immunologic: Negative.   Neurological: Negative.   Hematological: Negative.   Psychiatric/Behavioral: Negative.       Today's Vitals   03/31/23 1410  BP: 126/84  Pulse: 98  Temp: 98.2 F (36.8 C)  SpO2: 98%  Weight: 207 lb 3.2 oz (94 kg)  Height: 5\' 1"  (1.549 m)   Body mass index is 39.15 kg/m.  Wt Readings from Last 3 Encounters:  03/31/23 207 lb 3.2 oz (94 kg)  03/20/23 195 lb (88.5 kg)  09/23/22 197 lb 12.8 oz (89.7 kg)    The 10-year ASCVD risk score (Arnett DK, et al., 2019) is: 10.8%   Values used to calculate the score:     Age: 13 years     Sex: Female     Is Non-Hispanic African American: Yes     Diabetic: No     Tobacco smoker: Yes     Systolic Blood Pressure: 126 mmHg     Is BP treated: Yes     HDL Cholesterol: 38 mg/dL     Total Cholesterol: 189 mg/dL   Objective:  Physical Exam Vitals and nursing note reviewed.  Constitutional:      Appearance: Normal appearance.  HENT:     Head: Normocephalic and atraumatic.     Right Ear: Tympanic membrane, ear canal and external ear normal.     Left Ear: Tympanic membrane, ear canal and external ear normal.     Nose: Nose normal.     Mouth/Throat:     Mouth: Mucous membranes are moist.     Pharynx: Oropharynx is clear.  Eyes:     Extraocular Movements: Extraocular movements intact.     Conjunctiva/sclera: Conjunctivae normal.     Pupils: Pupils are  equal, round, and reactive to light.  Neck:     Thyroid: Thyromegaly present.  Cardiovascular:     Rate and Rhythm: Normal rate and regular rhythm.     Pulses: Normal pulses.     Heart sounds: Normal heart sounds.  Pulmonary:     Effort: Pulmonary effort is normal.     Breath sounds: Normal breath sounds.  Chest:  Breasts:    Tanner Score is 5.     Right: Normal.     Left: Normal.  Abdominal:     General: Bowel sounds are normal.     Palpations: Abdomen is soft.  Comments: Rounded, soft. Difficult to assess organomegaly  Genitourinary:    Comments: deferred Musculoskeletal:        General: Normal range of motion.     Cervical back: Normal range of motion and neck supple.  Skin:    General: Skin is warm and dry.  Neurological:     General: No focal deficit present.     Mental Status: She is alert and oriented to person, place, and time.  Psychiatric:        Mood and Affect: Mood normal.        Behavior: Behavior normal.         Assessment And Plan:     Encounter for health maintenance examination Assessment & Plan: A full exam was performed.  Importance of monthly self breast exams was discussed with the patient.  She is advised to get 30-45 minutes of regular exercise, no less than four to five days per week. Both weight-bearing and aerobic exercises are recommended.  She is advised to follow a healthy diet with at least six fruits/veggies per day, decrease intake of red meat and other saturated fats and to increase fish intake to twice weekly.  Meats/fish should not be fried -- baked, boiled or broiled is preferable. It is also important to cut back on your sugar intake.  Be sure to read labels - try to avoid anything with added sugar, high fructose corn syrup or other sweeteners.  If you must use a sweetener, you can try stevia or monkfruit.  It is also important to avoid artificially sweetened foods/beverages and diet drinks. Lastly, wear SPF 50 sunscreen on exposed skin  and when in direct sunlight for an extended period of time.  Be sure to avoid fast food restaurants and aim for at least 60 ounces of water daily.       Essential hypertension, benign Assessment & Plan: Chronic, fair control. Goal BP<120/80.  EKG performed, NSR w/o acute changes.  She will continue with hydrochlorothiazide 12.5mg  daily. Encouraged to follow low sodium diet. Encouraged to also incorporate more exercise into her daily routine.  She will f/u in six months for re-evaluation.   Orders: -     Microalbumin / creatinine urine ratio -     EKG 12-Lead -     CBC -     CMP14+EGFR -     Lipid panel -     Lipoprotein A (LPA)  Atypical chest pain Assessment & Plan: Most recent ER records reviewed in detail.  She was seen in ER on 11/15 for further evaluation of cp. She was found to have elevated D-dimer, this was post COVID. It was not felt to be significant. Sx thought to be due to musculoskeletal origin. She has not had any recurrence of her sx.    Other abnormal glucose Assessment & Plan: Previous labs reviewed, her A1c has been elevated in the past. I will check an A1c today. Reminded to avoid refined sugars including sugary drinks/foods and processed meats including bacon, sausages and deli meats.    Orders: -     CMP14+EGFR -     Hemoglobin A1c  Tobacco use disorder Assessment & Plan: Smoking cessation instruction/counseling given:  counseled patient on the dangers of tobacco use, advised patient to stop smoking, and reviewed strategies to maximize success.  She is not ready to quit at this time.     Goiter Assessment & Plan: Thyroid ultrasound performed in 2023.  Right thyroid nodule noted, no need for biopsy  or further testing. She is encouraged to let me know if she feels it is increasing in size, or if she develops compressive symptoms.    Class 2 severe obesity due to excess calories with serious comorbidity and body mass index (BMI) of 39.0 to 39.9 in adult  [facility]) Assessment & Plan: She has gained ten pounds since May. She is encouraged to aim for at least 150 minutes of exercise/week, while striving to lose ten percent of her body weight to decrease cardiac risk.    Screening-pulmonary TB Assessment & Plan: She needs TB screen for work, will check labs as below.   Orders: -     QuantiFERON-TB Gold Plus  Encounter for screening for HIV -     HIV Antibody (routine testing w rflx)  She is encouraged to strive for BMI less than 30 to decrease cardiac risk. Advised to aim for at least 150 minutes of exercise per week.    Return for 1 year HM, 4 month bpc. Patient was given opportunity to ask questions. Patient verbalized understanding of the plan and was able to repeat key elements of the plan. All questions were answered to their satisfaction.    I, Gwynneth Aliment, MD, have reviewed all documentation for this visit. The documentation on 04/05/23 for the exam, diagnosis, procedures, and orders are all accurate and complete.

## 2023-03-31 NOTE — Patient Instructions (Signed)

## 2023-04-01 LAB — CMP14+EGFR
ALT: 22 [IU]/L (ref 0–32)
AST: 17 [IU]/L (ref 0–40)
Albumin: 4.3 g/dL (ref 3.8–4.9)
Alkaline Phosphatase: 96 [IU]/L (ref 44–121)
BUN/Creatinine Ratio: 17 (ref 9–23)
BUN: 15 mg/dL (ref 6–24)
Bilirubin Total: <0.2 mg/dL (ref 0.0–1.2)
CO2: 23 mmol/L (ref 20–29)
Calcium: 9.5 mg/dL (ref 8.7–10.2)
Chloride: 103 mmol/L (ref 96–106)
Creatinine, Ser: 0.88 mg/dL (ref 0.57–1.00)
Globulin, Total: 2.6 g/dL (ref 1.5–4.5)
Glucose: 79 mg/dL (ref 70–99)
Potassium: 3.5 mmol/L (ref 3.5–5.2)
Sodium: 143 mmol/L (ref 134–144)
Total Protein: 6.9 g/dL (ref 6.0–8.5)
eGFR: 79 mL/min/{1.73_m2} (ref >59)

## 2023-04-01 LAB — CBC
Hematocrit: 38.1 % (ref 34.0–46.6)
Hemoglobin: 12.5 g/dL (ref 11.1–15.9)
MCH: 29.1 pg (ref 26.6–33.0)
MCHC: 32.8 g/dL (ref 31.5–35.7)
MCV: 89 fL (ref 79–97)
Platelets: 308 10*3/uL (ref 150–450)
RBC: 4.29 x10E6/uL (ref 3.77–5.28)
RDW: 13.1 % (ref 11.7–15.4)
WBC: 10.9 10*3/uL — ABNORMAL HIGH (ref 3.4–10.8)

## 2023-04-01 LAB — HEMOGLOBIN A1C
Est. average glucose Bld gHb Est-mCnc: 128 mg/dL
Hgb A1c MFr Bld: 6.1 % — ABNORMAL HIGH (ref 4.8–5.6)

## 2023-04-01 LAB — LIPID PANEL
Chol/HDL Ratio: 5 {ratio} — ABNORMAL HIGH (ref 0.0–4.4)
Cholesterol, Total: 189 mg/dL (ref 100–199)
HDL: 38 mg/dL — ABNORMAL LOW (ref >39)
LDL Chol Calc (NIH): 132 mg/dL — ABNORMAL HIGH (ref 0–99)
Triglycerides: 102 mg/dL (ref 0–149)
VLDL Cholesterol Cal: 19 mg/dL (ref 5–40)

## 2023-04-01 LAB — LIPOPROTEIN A (LPA): Lipoprotein (a): 219.7 nmol/L — ABNORMAL HIGH (ref <75.0)

## 2023-04-04 LAB — MICROALBUMIN / CREATININE URINE RATIO
Creatinine, Urine: 72.6 mg/dL
Microalb/Creat Ratio: <4 mg/g{creat} (ref 0–29)
Microalbumin, Urine: <3 ug/mL

## 2023-04-04 LAB — QUANTIFERON-TB GOLD PLUS
QuantiFERON Mitogen Value: >10 [IU]/mL
QuantiFERON Nil Value: 0.04 [IU]/mL
QuantiFERON TB1 Ag Value: 0.03 [IU]/mL
QuantiFERON TB2 Ag Value: 0.05 [IU]/mL
QuantiFERON-TB Gold Plus: NEGATIVE

## 2023-04-04 LAB — HIV ANTIBODY (ROUTINE TESTING W REFLEX): HIV Screen 4th Generation wRfx: NONREACTIVE

## 2023-04-05 DIAGNOSIS — Z111 Encounter for screening for respiratory tuberculosis: Secondary | ICD-10-CM | POA: Insufficient documentation

## 2023-04-05 DIAGNOSIS — R0789 Other chest pain: Secondary | ICD-10-CM | POA: Insufficient documentation

## 2023-04-05 DIAGNOSIS — F17200 Nicotine dependence, unspecified, uncomplicated: Secondary | ICD-10-CM | POA: Insufficient documentation

## 2023-04-05 NOTE — Assessment & Plan Note (Signed)
She needs TB screen for work, will check labs as below.

## 2023-04-05 NOTE — Assessment & Plan Note (Addendum)
Most recent ER records reviewed in detail.  She was seen in ER on 11/15 for further evaluation of cp. She was found to have elevated D-dimer, this was post COVID. It was not felt to be significant. Sx thought to be due to musculoskeletal origin. She has not had any recurrence of her sx.

## 2023-04-05 NOTE — Assessment & Plan Note (Signed)
Smoking cessation instruction/counseling given:  counseled patient on the dangers of tobacco use, advised patient to stop smoking, and reviewed strategies to maximize success She is not ready to quit at this time.

## 2023-04-05 NOTE — Assessment & Plan Note (Signed)
She has gained ten pounds since May. She is encouraged to aim for at least 150 minutes of exercise/week, while striving to lose ten percent of her body weight to decrease cardiac risk.

## 2023-04-05 NOTE — Assessment & Plan Note (Signed)

## 2023-04-05 NOTE — Assessment & Plan Note (Addendum)
Chronic, fair control. Goal BP<120/80.  EKG performed, NSR w/o acute changes.  She will continue with hydrochlorothiazide 12.5mg  daily. Encouraged to follow low sodium diet. Encouraged to also incorporate more exercise into her daily routine.  She will f/u in six months for re-evaluation.

## 2023-04-05 NOTE — Assessment & Plan Note (Addendum)
Thyroid ultrasound performed in 2023.  Right thyroid nodule noted, no need for biopsy or further testing. She is encouraged to let me know if she feels it is increasing in size, or if she develops compressive symptoms.

## 2023-04-05 NOTE — Assessment & Plan Note (Signed)
Previous labs reviewed, her A1c has been elevated in the past. I will check an A1c today. Reminded to avoid refined sugars including sugary drinks/foods and processed meats including bacon, sausages and deli meats.  

## 2023-04-08 ENCOUNTER — Encounter: Payer: Self-pay | Admitting: Internal Medicine

## 2023-07-06 ENCOUNTER — Encounter: Payer: Self-pay | Admitting: Internal Medicine

## 2023-07-07 ENCOUNTER — Telehealth (HOSPITAL_COMMUNITY): Payer: Self-pay | Admitting: *Deleted

## 2023-07-07 NOTE — Telephone Encounter (Signed)
 Pt called regarding needing proof of ED visit for work for a visit in 2018.  RNCM confirmed that pt was treated here and provided excuse as requested.  Morgyn Marut J. Lucretia Roers, RN, BSN, NCM  Transitions of Care  Nurse Case Manager  [facility] Emergency Departments  Operative Services  [phone]

## 2023-08-11 ENCOUNTER — Encounter: Payer: Self-pay | Admitting: Internal Medicine

## 2023-08-11 ENCOUNTER — Ambulatory Visit: Payer: Medicaid Other | Admitting: Internal Medicine

## 2023-08-11 VITALS — BP 122/84 | HR 83 | Temp 97.9°F | Ht 61.0 in | Wt 205.8 lb

## 2023-08-11 DIAGNOSIS — R7309 Other abnormal glucose: Secondary | ICD-10-CM

## 2023-08-11 DIAGNOSIS — J4520 Mild intermittent asthma, uncomplicated: Secondary | ICD-10-CM

## 2023-08-11 DIAGNOSIS — E7841 Elevated Lipoprotein(a): Secondary | ICD-10-CM

## 2023-08-11 DIAGNOSIS — Z23 Encounter for immunization: Secondary | ICD-10-CM

## 2023-08-11 DIAGNOSIS — J302 Other seasonal allergic rhinitis: Secondary | ICD-10-CM | POA: Diagnosis not present

## 2023-08-11 DIAGNOSIS — I10 Essential (primary) hypertension: Secondary | ICD-10-CM | POA: Diagnosis not present

## 2023-08-11 DIAGNOSIS — E66812 Obesity, class 2: Secondary | ICD-10-CM

## 2023-08-11 DIAGNOSIS — F17200 Nicotine dependence, unspecified, uncomplicated: Secondary | ICD-10-CM

## 2023-08-11 DIAGNOSIS — E049 Nontoxic goiter, unspecified: Secondary | ICD-10-CM

## 2023-08-11 DIAGNOSIS — Z6838 Body mass index (BMI) 38.0-38.9, adult: Secondary | ICD-10-CM

## 2023-08-11 LAB — CMP14+EGFR
ALT: 22 IU/L (ref 0–32)
AST: 21 IU/L (ref 0–40)
Albumin: 4.4 g/dL (ref 3.8–4.9)
Alkaline Phosphatase: 99 IU/L (ref 44–121)
BUN/Creatinine Ratio: 18 (ref 9–23)
BUN: 14 mg/dL (ref 6–24)
Bilirubin Total: <0.2 mg/dL (ref 0.0–1.2)
CO2: 23 mmol/L (ref 20–29)
Calcium: 9.6 mg/dL (ref 8.7–10.2)
Chloride: 102 mmol/L (ref 96–106)
Creatinine, Ser: 0.78 mg/dL (ref 0.57–1.00)
Globulin, Total: 3 g/dL (ref 1.5–4.5)
Glucose: 88 mg/dL (ref 70–99)
Potassium: 4 mmol/L (ref 3.5–5.2)
Sodium: 139 mmol/L (ref 134–144)
Total Protein: 7.4 g/dL (ref 6.0–8.5)
eGFR: 91 mL/min/{1.73_m2} (ref >59)

## 2023-08-11 LAB — LIPID PANEL
Chol/HDL Ratio: 4.2 ratio (ref 0.0–4.4)
Cholesterol, Total: 192 mg/dL (ref 100–199)
HDL: 46 mg/dL (ref >39)
LDL Chol Calc (NIH): 135 mg/dL — ABNORMAL HIGH (ref 0–99)
Triglycerides: 57 mg/dL (ref 0–149)
VLDL Cholesterol Cal: 11 mg/dL (ref 5–40)

## 2023-08-11 LAB — HEMOGLOBIN A1C
Est. average glucose Bld gHb Est-mCnc: 123 mg/dL
Hgb A1c MFr Bld: 5.9 % — ABNORMAL HIGH (ref 4.8–5.6)

## 2023-08-11 MED ORDER — MONTELUKAST SODIUM 10 MG PO TABS: ORAL_TABLET | ORAL | 3 refills | Status: AC

## 2023-08-11 MED ORDER — LEVOCETIRIZINE DIHYDROCHLORIDE 5 MG PO TABS: 5.0000 mg | ORAL_TABLET | Freq: Every evening | ORAL | 2 refills | Status: AC

## 2023-08-11 MED ORDER — HYDROCHLOROTHIAZIDE 12.5 MG PO CAPS: ORAL_CAPSULE | ORAL | 2 refills | Status: DC

## 2023-08-11 NOTE — Patient Instructions (Signed)
 Hypertension, Adult Hypertension is another name for high blood pressure. High blood pressure forces your heart to work harder to pump blood. This can cause problems over time. There are two numbers in a blood pressure reading. There is a top number (systolic) over a bottom number (diastolic). It is best to have a blood pressure that is below 120/80. What are the causes? The cause of this condition is not known. Some other conditions can lead to high blood pressure. What increases the risk? Some lifestyle factors can make you more likely to develop high blood pressure: Smoking. Not getting enough exercise or physical activity. Being overweight. Having too much fat, sugar, calories, or salt (sodium) in your diet. Drinking too much alcohol. Other risk factors include: Having any of these conditions: Heart disease. Diabetes. High cholesterol. Kidney disease. Obstructive sleep apnea. Having a family history of high blood pressure and high cholesterol. Age. The risk increases with age. Stress. What are the signs or symptoms? High blood pressure may not cause symptoms. Very high blood pressure (hypertensive crisis) may cause: Headache. Fast or uneven heartbeats (palpitations). Shortness of breath. Nosebleed. Vomiting or feeling like you may vomit (nauseous). Changes in how you see. Very bad chest pain. Feeling dizzy. Seizures. How is this treated? This condition is treated by making healthy lifestyle changes, such as: Eating healthy foods. Exercising more. Drinking less alcohol. Your doctor may prescribe medicine if lifestyle changes do not help enough and if: Your top number is above 130. Your bottom number is above 80. Your personal target blood pressure may vary. Follow these instructions at home: Eating and drinking  If told, follow the DASH eating plan. To follow this plan: Fill one half of your plate at each meal with fruits and vegetables. Fill one fourth of your plate  at each meal with whole grains. Whole grains include whole-wheat pasta, brown rice, and whole-grain bread. Eat or drink low-fat dairy products, such as skim milk or low-fat yogurt. Fill one fourth of your plate at each meal with low-fat (lean) proteins. Low-fat proteins include fish, chicken without skin, eggs, beans, and tofu. Avoid fatty meat, cured and processed meat, or chicken with skin. Avoid pre-made or processed food. Limit the amount of salt in your diet to less than 1,500 mg each day. Do not drink alcohol if: Your doctor tells you not to drink. You are pregnant, may be pregnant, or are planning to become pregnant. If you drink alcohol: Limit how much you have to: 0-1 drink a day for women. 0-2 drinks a day for men. Know how much alcohol is in your drink. In the [place] one drink equals one 12 oz bottle of beer (355 mL), one 5 oz glass of wine (148 mL), or one 1 oz glass of hard liquor (44 mL). Lifestyle  Work with your doctor to stay at a healthy weight or to lose weight. Ask your doctor what the best weight is for you. Get at least 30 minutes of exercise that causes your heart to beat faster (aerobic exercise) most days of the week. This may include walking, swimming, or biking. Get at least 30 minutes of exercise that strengthens your muscles (resistance exercise) at least 3 days a week. This may include lifting weights or doing Pilates. Do not smoke or use any products that contain nicotine or tobacco. If you need help quitting, ask your doctor. Check your blood pressure at home as told by your doctor. Keep all follow-up visits. Medicines Take over-the-counter and prescription medicines  only as told by your doctor. Follow directions carefully. Do not skip doses of blood pressure medicine. The medicine does not work as well if you skip doses. Skipping doses also puts you at risk for problems. Ask your doctor about side effects or reactions to medicines that you should watch  for. Contact a doctor if: You think you are having a reaction to the medicine you are taking. You have headaches that keep coming back. You feel dizzy. You have swelling in your ankles. You have trouble with your vision. Get help right away if: You get a very bad headache. You start to feel mixed up (confused). You feel weak or numb. You feel faint. You have very bad pain in your: Chest. Belly (abdomen). You vomit more than once. You have trouble breathing. These symptoms may be an emergency. Get help right away. Call 911. Do not wait to see if the symptoms will go away. Do not drive yourself to the hospital. Summary Hypertension is another name for high blood pressure. High blood pressure forces your heart to work harder to pump blood. For most people, a normal blood pressure is less than 120/80. Making healthy choices can help lower blood pressure. If your blood pressure does not get lower with healthy choices, you may need to take medicine. This information is not intended to replace advice given to you by your health care provider. Make sure you discuss any questions you have with your health care provider. Document Revised: 02/07/2021 Document Reviewed: 02/07/2021 Elsevier Patient Education  2024 [employer].

## 2023-08-11 NOTE — Progress Notes (Signed)
 I,Lisa Nash, CMA,acting as a Neurosurgeon for Lisa Dung, MD.,have documented all relevant documentation on the behalf of Lisa Dung, MD,as directed by  Lisa Dung, MD while in the presence of Lisa Dung, MD.  Subjective:  Patient ID: Lisa Nash  , female    DOB: 10-02-1970 , 53 y.o.   MRN: [id number]  Chief Complaint  Patient presents with   Hypertension    Patient presents today for bpc. She reports compliance with medications. Denies headache, chest pain & sob. She complains of a consistent cough due to allergies. She does take Montelukast at night before bed. She would like to take something for during the day.     Discussed the use of AI scribe software for clinical note transcription with the patient, who gave verbal consent to proceed.  History of Present Illness Lisa Nash  is a 53 year old female who presents for a blood pressure check.  Her blood pressure reading today is 122/84 mmHg, and she monitors her blood pressure at home, reporting similar readings. If she misses a dose of her medication, she experiences symptoms such as sweating and feeling very hot.  She has a history of elevated lipoprotein(a) levels, indicating a genetic predisposition to heart disease. Her cholesterol levels were previously noted to be high, and she has not yet started on cholesterol-lowering medication. She is considering further testing to assess her cardiac risk. Family history includes her grandmother, who is 36 years old and has some dementia. Her grandmother and mother both take cholesterol medications.  She experiences significant allergy symptoms due to pollen, affecting her ability to exercise outdoors.  In terms of exercise, she has been walking but acknowledges the need to increase her activity level. She has recently purchased hand weights but has not yet incorporated them into her routine. She mentions eating more food lately, which she believes has  contributed to weight gain, despite only eating once a day.    Past Medical History:  Diagnosis Date   Arthritis    Asthma    Eczema      Family History  Problem Relation Age of Onset   Hypertension Mother    Hypertension Father    Arthritis Father    Breast cancer Neg Hx      Current Outpatient Medications:    albuterol (VENTOLIN HFA) 108 (90 Base) MCG/ACT inhaler, Inhale 2 puffs into the lungs every 6 (six) hours as needed for wheezing or shortness of breath., Disp: 18 g, Rfl: 1   clobetasol (TEMOVATE) 0.05 % external solution, Apply topically., Disp: , Rfl:    clobetasol ointment (TEMOVATE) 0.05 %, Apply to scalp 3-4 times per week. Not to face., Disp: , Rfl:    EPINEPHrine 0.3 mg/0.3 mL IJ SOAJ injection, Inject 0.3 mg into the muscle as needed for anaphylaxis., Disp: 1 each, Rfl: 0   levocetirizine (XYZAL) 5 MG tablet, Take 1 tablet (5 mg total) by mouth every evening., Disp: 90 tablet, Rfl: 2   minoxidil (LONITEN) 2.5 MG tablet, Take 1/2 tab by mouth daily, Disp: , Rfl:    hydrochlorothiazide (MICROZIDE) 12.5 MG capsule, TAKE 1 CAPSULE(12.5 MG) BY MOUTH DAILY, Disp: 90 capsule, Rfl: 2   montelukast (SINGULAIR) 10 MG tablet, TAKE 1 TABLET BY MOUTH EVERY DAY IN THE EVENING, Disp: 90 tablet, Rfl: 3   promethazine-dextromethorphan (PROMETHAZINE-DM) 6.25-15 MG/5ML syrup, Take 5 mLs by mouth 4 (four) times daily as needed for cough. (Patient not taking: Reported on 08/11/2023), Disp: 240 mL,  Rfl: 0   Allergies  Allergen Reactions   Bee Pollen Anaphylaxis   Adhesive [Tape]    Pollen Extract      Review of Systems  Constitutional: Negative.   Respiratory:  Positive for cough.   Cardiovascular: Negative.   Gastrointestinal: Negative.   Neurological: Negative.   Psychiatric/Behavioral: Negative.       Today's Vitals   08/11/23 0832  BP: 122/84  Pulse: 83  Temp: 97.9 F (36.6 C)  SpO2: 98%  Weight: 205 lb 12.8 oz (93.4 kg)  Height: 5\' 1"  (1.549 m)   Body mass index  is 38.89 kg/m.  Wt Readings from Last 3 Encounters:  08/11/23 205 lb 12.8 oz (93.4 kg)  03/31/23 207 lb 3.2 oz (94 kg)  03/20/23 195 lb (88.5 kg)     Objective:  Physical Exam Vitals and nursing note reviewed.  Constitutional:      Appearance: Normal appearance.  HENT:     Head: Normocephalic and atraumatic.  Eyes:     Extraocular Movements: Extraocular movements intact.  Cardiovascular:     Rate and Rhythm: Normal rate and regular rhythm.     Heart sounds: Normal heart sounds.  Pulmonary:     Effort: Pulmonary effort is normal.     Breath sounds: Normal breath sounds.  Musculoskeletal:     Cervical back: Normal range of motion.  Skin:    General: Skin is warm.  Neurological:     General: No focal deficit present.     Mental Status: She is alert.  Psychiatric:        Mood and Affect: Mood normal.        Behavior: Behavior normal.         Assessment And Plan:  Essential hypertension, benign Assessment & Plan: Chronic, fair control. Goal BP<120/80.  She will continue with hydrochlorothiazide 12.5mg  daily. Encouraged to follow low sodium diet. Encouraged to also incorporate more exercise into her daily routine.  She will f/u in six months for re-evaluation.   Orders: -     CMP14+EGFR -     Lipid panel  Elevated Lp(a) Assessment & Plan: She is encouraged to consider statin therapy. I will check lipid panel today and make further recommendations once her results are available for review.    Seasonal allergies [J30.2] Assessment & Plan: Significant symptoms due to pollen affecting exercise. Adjusted medication timing for symptom management. - Refill Singulair prescription. - Start levocetirizine at night.   Mild intermittent asthma without complication Assessment & Plan: Chronic, will add montelukast to her current regimen.   Orders: -     Montelukast Sodium; TAKE 1 TABLET BY MOUTH EVERY DAY IN THE EVENING  Dispense: 90 tablet; Refill: 3  Goiter Assessment  & Plan: Thyroid ultrasound performed in 2023.  Right thyroid nodule noted, no need for biopsy or further testing. She is encouraged to let me know if she feels it is increasing in size, or if she develops compressive symptoms.    Other abnormal glucose Assessment & Plan: Previous labs reviewed, her A1c has been elevated in the past. I will check an A1c today. Reminded to avoid refined sugars including sugary drinks/foods and processed meats including bacon, sausages and deli meats.    Orders: -     CMP14+EGFR -     Hemoglobin A1c  Tobacco use disorder Assessment & Plan: Smoking cessation instruction/counseling given:  counseled patient on the dangers of tobacco use, advised patient to stop smoking, and reviewed strategies to maximize success.  She is not ready to quit at this time.    Orders: -     CT CHEST LUNG CANCER SCREENING LOW DOSE WO CONTRAST; Future  Class 2 severe obesity due to excess calories with serious comorbidity and body mass index (BMI) of 38.0 to 38.9 in adult [facility]) Assessment & Plan: She is encouraged to aim for at least 150 minutes of exercise/week, while striving to lose ten percent of her body weight to decrease cardiac risk.    Immunization due -     Pneumococcal conjugate vaccine 20-valent  Other orders -     hydroCHLOROthiazide; TAKE 1 CAPSULE(12.5 MG) BY MOUTH DAILY  Dispense: 90 capsule; Refill: 2 -     Levocetirizine Dihydrochloride; Take 1 tablet (5 mg total) by mouth every evening.  Dispense: 90 tablet; Refill: 2  General Health Maintenance Discussed exercise, dietary modifications, and strength training. Advised more frequent meals to boost metabolism. - Encourage eating three high-protein meals a day. - Incorporate strength training with hand weights.  Return in 4 months (on 12/11/2023) for 4 month bpc..  Patient was given opportunity to ask questions. Patient verbalized understanding of the plan and was able to repeat key elements of the plan. All  questions were answered to their satisfaction.    I, Lisa Dung, MD, have reviewed all documentation for this visit. The documentation on 08/11/23 for the exam, diagnosis, procedures, and orders are all accurate and complete.   IF YOU HAVE BEEN REFERRED TO A SPECIALIST, IT MAY TAKE 1-2 WEEKS TO SCHEDULE/PROCESS THE REFERRAL. IF YOU HAVE NOT HEARD FROM US /SPECIALIST IN TWO WEEKS, PLEASE GIVE US  A CALL AT [phone].   THE PATIENT IS ENCOURAGED TO PRACTICE SOCIAL DISTANCING DUE TO THE COVID-19 PANDEMIC.

## 2023-08-14 ENCOUNTER — Encounter: Payer: Self-pay | Admitting: Internal Medicine

## 2023-08-16 NOTE — Assessment & Plan Note (Signed)
 Previous labs reviewed, her A1c has been elevated in the past. I will check an A1c today. Reminded to avoid refined sugars including sugary drinks/foods and processed meats including bacon, sausages and deli meats.

## 2023-08-16 NOTE — Assessment & Plan Note (Signed)
 Thyroid ultrasound performed in 2023.  Right thyroid nodule noted, no need for biopsy or further testing. She is encouraged to let me know if she feels it is increasing in size, or if she develops compressive symptoms.

## 2023-08-16 NOTE — Assessment & Plan Note (Signed)
 Chronic, will add montelukast to her current regimen.

## 2023-08-16 NOTE — Assessment & Plan Note (Addendum)
 Significant symptoms due to pollen affecting exercise. Adjusted medication timing for symptom management. - Refill Singulair prescription. - Start levocetirizine at night.

## 2023-08-16 NOTE — Assessment & Plan Note (Signed)
 She is encouraged to aim for at least 150 minutes of exercise/week, while striving to lose ten percent of her body weight to decrease cardiac risk.

## 2023-08-16 NOTE — Assessment & Plan Note (Signed)
 She is encouraged to consider statin therapy. I will check lipid panel today and make further recommendations once her results are available for review.

## 2023-08-16 NOTE — Assessment & Plan Note (Signed)
 Chronic, fair control. Goal BP<120/80.  She will continue with hydrochlorothiazide 12.5mg  daily. Encouraged to follow low sodium diet. Encouraged to also incorporate more exercise into her daily routine.  She will f/u in six months for re-evaluation.

## 2023-08-16 NOTE — Assessment & Plan Note (Signed)
Smoking cessation instruction/counseling given:  counseled patient on the dangers of tobacco use, advised patient to stop smoking, and reviewed strategies to maximize success She is not ready to quit at this time.

## 2023-10-27 ENCOUNTER — Other Ambulatory Visit: Payer: Self-pay | Admitting: Internal Medicine

## 2023-10-27 DIAGNOSIS — Z1231 Encounter for screening mammogram for malignant neoplasm of breast: Secondary | ICD-10-CM

## 2023-11-04 ENCOUNTER — Encounter: Payer: Self-pay | Admitting: Internal Medicine

## 2023-11-05 ENCOUNTER — Ambulatory Visit
Admission: RE | Admit: 2023-11-05 | Discharge: 2023-11-05 | Disposition: A | Discharge status: Home / Self Care | Source: Ambulatory Visit | Attending: Internal Medicine | Admitting: Internal Medicine

## 2023-11-05 DIAGNOSIS — F17200 Nicotine dependence, unspecified, uncomplicated: Secondary | ICD-10-CM

## 2023-11-05 DIAGNOSIS — Z1231 Encounter for screening mammogram for malignant neoplasm of breast: Secondary | ICD-10-CM

## 2023-11-17 ENCOUNTER — Ambulatory Visit: Payer: Self-pay | Admitting: Internal Medicine

## 2023-11-17 DIAGNOSIS — M1990 Unspecified osteoarthritis, unspecified site: Secondary | ICD-10-CM

## 2023-11-18 ENCOUNTER — Other Ambulatory Visit: Payer: Self-pay

## 2023-11-18 DIAGNOSIS — M1990 Unspecified osteoarthritis, unspecified site: Secondary | ICD-10-CM

## 2023-11-19 LAB — ANTINUCLEAR ANTIBODIES, IFA: ANA Titer 1: POSITIVE — AB

## 2023-11-19 LAB — RHEUMATOID FACTOR: Rheumatoid fact SerPl-aCnc: <10 [IU]/mL (ref <14.0)

## 2023-11-19 LAB — CYCLIC CITRUL PEPTIDE ANTIBODY, IGG/IGA: Cyclic Citrullin Peptide Ab: 12 U (ref 0–19)

## 2023-11-19 LAB — FANA STAINING PATTERNS: Speckled Pattern: 1:80 {titer}

## 2023-11-19 LAB — SEDIMENTATION RATE: Sed Rate: 33 mm/h (ref 0–40)

## 2023-11-19 LAB — URIC ACID: Uric Acid: 5.9 mg/dL (ref 3.0–7.2)

## 2023-11-22 ENCOUNTER — Ambulatory Visit: Payer: Self-pay | Admitting: Internal Medicine

## 2023-11-22 DIAGNOSIS — R768 Other specified abnormal immunological findings in serum: Secondary | ICD-10-CM

## 2023-11-22 DIAGNOSIS — R9389 Abnormal findings on diagnostic imaging of other specified body structures: Secondary | ICD-10-CM

## 2023-12-15 ENCOUNTER — Encounter: Payer: Self-pay | Admitting: Internal Medicine

## 2023-12-15 ENCOUNTER — Ambulatory Visit: Admitting: Internal Medicine

## 2023-12-15 VITALS — BP 120/82 | HR 85 | Temp 97.8°F | Ht 61.0 in | Wt 204.6 lb

## 2023-12-15 DIAGNOSIS — R7309 Other abnormal glucose: Secondary | ICD-10-CM | POA: Diagnosis not present

## 2023-12-15 DIAGNOSIS — Z6838 Body mass index (BMI) 38.0-38.9, adult: Secondary | ICD-10-CM

## 2023-12-15 DIAGNOSIS — E66812 Obesity, class 2: Secondary | ICD-10-CM | POA: Diagnosis not present

## 2023-12-15 DIAGNOSIS — J4520 Mild intermittent asthma, uncomplicated: Secondary | ICD-10-CM

## 2023-12-15 DIAGNOSIS — I10 Essential (primary) hypertension: Secondary | ICD-10-CM

## 2023-12-15 LAB — HEMOGLOBIN A1C
Est. average glucose Bld gHb Est-mCnc: 126 mg/dL
Hgb A1c MFr Bld: 6 % — ABNORMAL HIGH (ref 4.8–5.6)

## 2023-12-15 MED ORDER — ALBUTEROL SULFATE HFA 108 (90 BASE) MCG/ACT IN AERS: 2.0000 | INHALATION_SPRAY | Freq: Four times a day (QID) | RESPIRATORY_TRACT | 1 refills | Status: AC | PRN

## 2023-12-15 NOTE — Assessment & Plan Note (Signed)
 Previous labs reviewed, her A1c has been elevated in the past. I will check an A1c today. Reminded to avoid refined sugars including sugary drinks/foods and processed meats including bacon, sausages and deli meats.

## 2023-12-15 NOTE — Assessment & Plan Note (Signed)
 Chronic, she is doing well since montelukast was added to her current regimen.  - I will refill albuterol to use rn

## 2023-12-15 NOTE — Progress Notes (Signed)
 I,Victoria T Emmitt, CMA,acting as a Neurosurgeon for Catheryn LOISE Slocumb, MD.,have documented all relevant documentation on the behalf of Catheryn LOISE Slocumb, MD,as directed by  Catheryn LOISE Slocumb, MD while in the presence of Catheryn LOISE Slocumb, MD.  Subjective:  Patient ID: Lisa Nash  , female    DOB: April 02, 1971 , 53 y.o.   MRN: [id number]  Chief Complaint  Patient presents with   Hypertension    Patient presents today for bpc. She reports compliance with medications. Denies headache, chest pain & sob.    HPI Discussed the use of AI scribe software for clinical note transcription with the patient, who gave verbal consent to proceed.  History of Present Illness Lisa Nash  is a 53 year old female with hypertension who presents for a blood pressure check.  She is happy to report she has been exercising more regularly.  She feels 'pretty good' and notes a reduction in abdominal bloating. She exercises three to four times a week at a local park using exercise equipment, including a sit-up machine. She has been monitoring her salt intake and has not cooked with salt in years.  She is currently taking Singulair  for allergies, Xyzal  at night due to its sedative effects, and hydrochlorothiazide  12.5 mg for blood pressure management. She also uses albuterol , and ibuprofen  as needed. She recently received a refill for her allergy medications.  Her prediabetes is monitored every four months. She plans to get her flu shot in September at [place].  No new concerns or symptoms.   Hypertension This is a chronic problem. The current episode started more than 1 year ago. The problem has been gradually improving since onset. The problem is controlled. Pertinent negatives include no blurred vision. Past treatments include diuretics. The current treatment provides moderate improvement. Compliance problems include exercise.      Past Medical History:  Diagnosis Date   Arthritis    Asthma    Eczema       Family History  Problem Relation Age of Onset   Hypertension Mother    Hypertension Father    Arthritis Father    Breast cancer Neg Hx      Current Outpatient Medications:    clobetasol (TEMOVATE) 0.05 % external solution, Apply topically., Disp: , Rfl:    clobetasol ointment (TEMOVATE) 0.05 %, Apply to scalp 3-4 times per week. Not to face., Disp: , Rfl:    EPINEPHrine  0.3 mg/0.3 mL IJ SOAJ injection, Inject 0.3 mg into the muscle as needed for anaphylaxis., Disp: 1 each, Rfl: 0   hydrochlorothiazide  (MICROZIDE ) 12.5 MG capsule, TAKE 1 CAPSULE(12.5 MG) BY MOUTH DAILY, Disp: 90 capsule, Rfl: 2   levocetirizine (XYZAL ) 5 MG tablet, Take 1 tablet (5 mg total) by mouth every evening., Disp: 90 tablet, Rfl: 2   minoxidil (LONITEN) 2.5 MG tablet, Take 1/2 tab by mouth daily, Disp: , Rfl:    montelukast  (SINGULAIR ) 10 MG tablet, TAKE 1 TABLET BY MOUTH EVERY DAY IN THE EVENING, Disp: 90 tablet, Rfl: 3   albuterol  (VENTOLIN  HFA) 108 (90 Base) MCG/ACT inhaler, Inhale 2 puffs into the lungs every 6 (six) hours as needed for wheezing or shortness of breath., Disp: 18 g, Rfl: 1   Allergies  Allergen Reactions   Bee Pollen Anaphylaxis   Adhesive [Tape]    Pollen Extract      Review of Systems  Constitutional: Negative.   Eyes:  Negative for blurred vision.  Respiratory: Negative.    Cardiovascular: Negative.   Gastrointestinal: Negative.  Neurological: Negative.   Psychiatric/Behavioral: Negative.       Today's Vitals   12/15/23 0916  BP: 120/82  Pulse: 85  Temp: 97.8 F (36.6 C)  SpO2: 98%  Weight: 204 lb 9.6 oz (92.8 kg)  Height: 5' 1 (1.549 m)   Body mass index is 38.66 kg/m.  Wt Readings from Last 3 Encounters:  12/15/23 204 lb 9.6 oz (92.8 kg)  08/11/23 205 lb 12.8 oz (93.4 kg)  03/31/23 207 lb 3.2 oz (94 kg)     Objective:  Physical Exam Vitals and nursing note reviewed.  Constitutional:      Appearance: Normal appearance.  HENT:     Head: Normocephalic and  atraumatic.  Eyes:     Extraocular Movements: Extraocular movements intact.  Cardiovascular:     Rate and Rhythm: Normal rate and regular rhythm.     Heart sounds: Normal heart sounds.  Pulmonary:     Effort: Pulmonary effort is normal.     Breath sounds: Normal breath sounds.  Musculoskeletal:     Cervical back: Normal range of motion.  Skin:    General: Skin is warm.  Neurological:     General: No focal deficit present.     Mental Status: She is alert.  Psychiatric:        Mood and Affect: Mood normal.        Behavior: Behavior normal.         Assessment And Plan:  Essential hypertension, benign Assessment & Plan: Chronic, fair control. Goal BP<120/80.  She will continue with hydrochlorothiazide  12.5mg  daily. Encouraged to follow low sodium diet. Encouraged to also incorporate more exercise into her daily routine.  She will f/u in six months for re-evaluation.    Other abnormal glucose Assessment & Plan: Previous labs reviewed, her A1c has been elevated in the past. I will check an A1c today. Reminded to avoid refined sugars including sugary drinks/foods and processed meats including bacon, sausages and deli meats.    Orders: -     Hemoglobin A1c  Mild intermittent asthma without complication Assessment & Plan: Chronic, she is doing well since montelukast  was added to her current regimen.  - I will refill albuterol  to use rn   Class 2 severe obesity due to excess calories with serious comorbidity and body mass index (BMI) of 38.0 to 38.9 in adult [facility]) Assessment & Plan: She is encouraged to strive for BMI less than 30 to decrease cardiac risk. Advised to aim for at least 150 minutes of exercise per week.    Other orders -     Albuterol  Sulfate HFA; Inhale 2 puffs into the lungs every 6 (six) hours as needed for wheezing or shortness of breath.  Dispense: 18 g; Refill: 1  Return in 6 weeks (on 01/26/2024), or [place] - flu shot.  Patient was given opportunity to ask  questions. Patient verbalized understanding of the plan and was able to repeat key elements of the plan. All questions were answered to their satisfaction.   I, Catheryn LOISE Slocumb, MD, have reviewed all documentation for this visit. The documentation on 12/15/23 for the exam, diagnosis, procedures, and orders are all accurate and complete.   IF YOU HAVE BEEN REFERRED TO A SPECIALIST, IT MAY TAKE 1-2 WEEKS TO SCHEDULE/PROCESS THE REFERRAL. IF YOU HAVE NOT HEARD FROM US /SPECIALIST IN TWO WEEKS, PLEASE GIVE US  A CALL AT [phone].   THE PATIENT IS ENCOURAGED TO PRACTICE SOCIAL DISTANCING DUE TO THE COVID-19 PANDEMIC.

## 2023-12-15 NOTE — Patient Instructions (Signed)
 Hypertension, Adult Hypertension is another name for high blood pressure. High blood pressure forces your heart to work harder to pump blood. This can cause problems over time. There are two numbers in a blood pressure reading. There is a top number (systolic) over a bottom number (diastolic). It is best to have a blood pressure that is below 120/80. What are the causes? The cause of this condition is not known. Some other conditions can lead to high blood pressure. What increases the risk? Some lifestyle factors can make you more likely to develop high blood pressure: Smoking. Not getting enough exercise or physical activity. Being overweight. Having too much fat, sugar, calories, or salt (sodium) in your diet. Drinking too much alcohol . Other risk factors include: Having any of these conditions: Heart disease. Diabetes. High cholesterol. Kidney disease. Obstructive sleep apnea. Having a family history of high blood pressure and high cholesterol. Age. The risk increases with age. Stress. What are the signs or symptoms? High blood pressure may not cause symptoms. Very high blood pressure (hypertensive crisis) may cause: Headache. Fast or uneven heartbeats (palpitations). Shortness of breath. Nosebleed. Vomiting or feeling like you may vomit (nauseous). Changes in how you see. Very bad chest pain. Feeling dizzy. Seizures. How is this treated? This condition is treated by making healthy lifestyle changes, such as: Eating healthy foods. Exercising more. Drinking less alcohol . Your doctor may prescribe medicine if lifestyle changes do not help enough and if: Your top number is above 130. Your bottom number is above 80. Your personal target blood pressure may vary. Follow these instructions at home: Eating and drinking  If told, follow the DASH eating plan. To follow this plan: Fill one half of your plate at each meal with fruits and vegetables. Fill one fourth of your plate  at each meal with whole grains. Whole grains include whole-wheat pasta, brown rice, and whole-grain bread. Eat or drink low-fat dairy products, such as skim milk or low-fat yogurt. Fill one fourth of your plate at each meal with low-fat (lean) proteins. Low-fat proteins include fish, chicken without skin, eggs, beans, and tofu. Avoid fatty meat, cured and processed meat, or chicken with skin. Avoid pre-made or processed food. Limit the amount of salt in your diet to less than 1,500 mg each day. Do not drink alcohol  if: Your doctor tells you not to drink. You are pregnant, may be pregnant, or are planning to become pregnant. If you drink alcohol : Limit how much you have to: 0-1 drink a day for women. 0-2 drinks a day for men. Know how much alcohol  is in your drink. In the [place] one drink equals one 12 oz bottle of beer (355 mL), one 5 oz glass of wine (148 mL), or one 1 oz glass of hard liquor (44 mL). Lifestyle  Work with your doctor to stay at a healthy weight or to lose weight. Ask your doctor what the best weight is for you. Get at least 30 minutes of exercise that causes your heart to beat faster (aerobic exercise) most days of the week. This may include walking, swimming, or biking. Get at least 30 minutes of exercise that strengthens your muscles (resistance exercise) at least 3 days a week. This may include lifting weights or doing Pilates. Do not smoke or use any products that contain nicotine  or tobacco. If you need help quitting, ask your doctor. Check your blood pressure at home as told by your doctor. Keep all follow-up visits. Medicines Take over-the-counter and prescription medicines  only as told by your doctor. Follow directions carefully. Do not skip doses of blood pressure medicine. The medicine does not work as well if you skip doses. Skipping doses also puts you at risk for problems. Ask your doctor about side effects or reactions to medicines that you should watch  for. Contact a doctor if: You think you are having a reaction to the medicine you are taking. You have headaches that keep coming back. You feel dizzy. You have swelling in your ankles. You have trouble with your vision. Get help right away if: You get a very bad headache. You start to feel mixed up (confused). You feel weak or numb. You feel faint. You have very bad pain in your: Chest. Belly (abdomen). You vomit more than once. You have trouble breathing. These symptoms may be an emergency. Get help right away. Call 911. Do not wait to see if the symptoms will go away. Do not drive yourself to the hospital. Summary Hypertension is another name for high blood pressure. High blood pressure forces your heart to work harder to pump blood. For most people, a normal blood pressure is less than 120/80. Making healthy choices can help lower blood pressure. If your blood pressure does not get lower with healthy choices, you may need to take medicine. This information is not intended to replace advice given to you by your health care provider. Make sure you discuss any questions you have with your health care provider. Document Revised: 02/07/2021 Document Reviewed: 02/07/2021 Elsevier Patient Education  2024 [employer].

## 2023-12-18 ENCOUNTER — Ambulatory Visit: Payer: Self-pay | Admitting: Internal Medicine

## 2023-12-19 NOTE — Assessment & Plan Note (Signed)
 She is encouraged to strive for BMI less than 30 to decrease cardiac risk. Advised to aim for at least 150 minutes of exercise per week.

## 2023-12-19 NOTE — Assessment & Plan Note (Signed)
 Chronic, fair control. Goal BP<120/80.  She will continue with hydrochlorothiazide  12.5mg  daily. Encouraged to follow low sodium diet. Encouraged to also incorporate more exercise into her daily routine.  She will f/u in six months for re-evaluation.

## 2023-12-31 ENCOUNTER — Ambulatory Visit: Admitting: Internal Medicine

## 2024-01-26 ENCOUNTER — Ambulatory Visit (INDEPENDENT_AMBULATORY_CARE_PROVIDER_SITE_OTHER)

## 2024-01-26 VITALS — BP 130/84 | HR 86 | Temp 98.1°F | Ht 61.0 in | Wt 204.0 lb

## 2024-01-26 DIAGNOSIS — Z23 Encounter for immunization: Secondary | ICD-10-CM

## 2024-01-26 NOTE — Progress Notes (Signed)
 Patient is in office today for a nurse visit for FLU Immunization. Patient Injection was given in the  Right deltoid. Patient tolerated injection well.

## 2024-01-26 NOTE — Patient Instructions (Signed)

## 2024-01-27 ENCOUNTER — Emergency Department (HOSPITAL_BASED_OUTPATIENT_CLINIC_OR_DEPARTMENT_OTHER): Admitting: Radiology

## 2024-01-27 ENCOUNTER — Emergency Department (HOSPITAL_BASED_OUTPATIENT_CLINIC_OR_DEPARTMENT_OTHER)
Admission: EM | Admit: 2024-01-27 | Discharge: 2024-01-27 | Disposition: A | Discharge status: Home / Self Care | Attending: Emergency Medicine | Admitting: Emergency Medicine

## 2024-01-27 ENCOUNTER — Other Ambulatory Visit: Payer: Self-pay

## 2024-01-27 DIAGNOSIS — M791 Myalgia, unspecified site: Secondary | ICD-10-CM | POA: Insufficient documentation

## 2024-01-27 DIAGNOSIS — J45909 Unspecified asthma, uncomplicated: Secondary | ICD-10-CM | POA: Insufficient documentation

## 2024-01-27 LAB — BASIC METABOLIC PANEL WITH GFR
Anion gap: 18 — ABNORMAL HIGH (ref 5–15)
BUN: 8 mg/dL (ref 6–20)
CO2: 20 mmol/L — ABNORMAL LOW (ref 22–32)
Calcium: 9.9 mg/dL (ref 8.9–10.3)
Chloride: 98 mmol/L (ref 98–111)
Creatinine, Ser: 0.76 mg/dL (ref 0.44–1.00)
GFR, Estimated: >60 mL/min (ref >60)
Glucose, Bld: 143 mg/dL — ABNORMAL HIGH (ref 70–99)
Potassium: 3.5 mmol/L (ref 3.5–5.1)
Sodium: 136 mmol/L (ref 135–145)

## 2024-01-27 LAB — TROPONIN T, HIGH SENSITIVITY: Troponin T High Sensitivity: <15 ng/L (ref 0–19)

## 2024-01-27 LAB — CBC
HCT: 40.3 % (ref 36.0–46.0)
Hemoglobin: 13.8 g/dL (ref 12.0–15.0)
MCH: 29.3 pg (ref 26.0–34.0)
MCHC: 34.2 g/dL (ref 30.0–36.0)
MCV: 85.6 fL (ref 80.0–100.0)
Platelets: 305 K/uL (ref 150–400)
RBC: 4.71 MIL/uL (ref 3.87–5.11)
RDW: 14.2 % (ref 11.5–15.5)
WBC: 13.1 K/uL — ABNORMAL HIGH (ref 4.0–10.5)
nRBC: 0 % (ref 0.0–0.2)

## 2024-01-27 LAB — RESP PANEL BY RT-PCR (RSV, FLU A&B, COVID)  RVPGX2
Influenza A by PCR: NEGATIVE
Influenza B by PCR: NEGATIVE
Resp Syncytial Virus by PCR: NEGATIVE
SARS Coronavirus 2 by RT PCR: NEGATIVE

## 2024-01-27 MED ORDER — ONDANSETRON HCL 4 MG/2ML IJ SOLN
4.0000 mg | Freq: Once | INTRAMUSCULAR | Status: AC
  Administered 2024-01-27: 4 mg via INTRAVENOUS
  Filled 2024-01-27: qty 2

## 2024-01-27 MED ORDER — ONDANSETRON 4 MG PO TBDP: 4.0000 mg | ORAL_TABLET | Freq: Three times a day (TID) | ORAL | 0 refills | Status: AC | PRN

## 2024-01-27 MED ORDER — ONDANSETRON 4 MG PO TBDP
4.0000 mg | ORAL_TABLET | Freq: Once | ORAL | Status: AC
  Administered 2024-01-27: 4 mg via ORAL
  Filled 2024-01-27: qty 1

## 2024-01-27 MED ORDER — KETOROLAC TROMETHAMINE 15 MG/ML IJ SOLN
15.0000 mg | Freq: Once | INTRAMUSCULAR | Status: AC
  Administered 2024-01-27: 15 mg via INTRAVENOUS
  Filled 2024-01-27: qty 1

## 2024-01-27 MED ORDER — IBUPROFEN 400 MG PO TABS
400.0000 mg | ORAL_TABLET | Freq: Once | ORAL | Status: AC | PRN
  Administered 2024-01-27: 400 mg via ORAL
  Filled 2024-01-27: qty 1

## 2024-01-27 NOTE — ED Notes (Signed)
 Pt was given apple juice, water and crackers.light dimmed for comfort

## 2024-01-27 NOTE — ED Provider Notes (Signed)
 [place] EMERGENCY DEPARTMENT AT [facility] Provider Note   CSN: [id number] Arrival date & time: 01/27/24  1452     Patient presents with: Generalized Body Aches   Lisa Nash  is a 53 y.o. female.   Body aches chills myalgias after flu shot yesterday.  Denies any fever.  Has a history of asthma arthritis.  She is having body aches everywhere.  Denies any diarrhea or vomiting but has felt nauseous.  She has had some discomfort in her chest as well.  No shortness of breath.  No recent surgery or travel.  The history is provided by the patient.       Prior to Admission medications   Medication Sig Start Date End Date Taking? Authorizing Provider  ondansetron  (ZOFRAN -ODT) 4 MG disintegrating tablet Take 1 tablet (4 mg total) by mouth every 8 (eight) hours as needed. 01/27/24  Yes Sanna Porcaro, DO  albuterol  (VENTOLIN  HFA) 108 (90 Base) MCG/ACT inhaler Inhale 2 puffs into the lungs every 6 (six) hours as needed for wheezing or shortness of breath. 12/15/23   Jarold Medici, MD  clobetasol (TEMOVATE) 0.05 % external solution Apply topically. 01/28/21   [provider]  clobetasol ointment (TEMOVATE) 0.05 % Apply to scalp 3-4 times per week. Not to face. 06/03/21   [provider]  EPINEPHrine  0.3 mg/0.3 mL IJ SOAJ injection Inject 0.3 mg into the muscle as needed for anaphylaxis. 10/21/20   Raspet, Rocky K, PA-C  hydrochlorothiazide  (MICROZIDE ) 12.5 MG capsule TAKE 1 CAPSULE(12.5 MG) BY MOUTH DAILY 08/11/23   Jarold Medici, MD  levocetirizine (XYZAL ) 5 MG tablet Take 1 tablet (5 mg total) by mouth every evening. 08/11/23   Jarold Medici, MD  minoxidil (LONITEN) 2.5 MG tablet Take 1/2 tab by mouth daily 02/07/21   [provider]  montelukast  (SINGULAIR ) 10 MG tablet TAKE 1 TABLET BY MOUTH EVERY DAY IN THE EVENING 08/11/23   Jarold Medici, MD    Allergies: Bee pollen, Adhesive [tape], and Pollen extract    Review of Systems  Updated Vital  Signs BP (!) 160/89 (BP Location: Left Arm)   Pulse (!) 102   Temp 98.8 F (37.1 C)   Resp (!) 21   Ht 5' 1 (1.549 m)   Wt 90.3 kg   LMP  (LMP Unknown) Comment: partial hysterectomy  SpO2 97%   BMI 37.60 kg/m   Physical Exam Vitals and nursing note reviewed.  Constitutional:      General: She is not in acute distress.    Appearance: She is well-developed. She is not ill-appearing.  HENT:     Head: Normocephalic and atraumatic.     Nose: Nose normal.     Mouth/Throat:     Mouth: Mucous membranes are moist.  Eyes:     Extraocular Movements: Extraocular movements intact.     Conjunctiva/sclera: Conjunctivae normal.     Pupils: Pupils are equal, round, and reactive to light.  Cardiovascular:     Rate and Rhythm: Normal rate and regular rhythm.     Pulses: Normal pulses.     Heart sounds: Normal heart sounds. No murmur heard. Pulmonary:     Effort: Pulmonary effort is normal. No respiratory distress.     Breath sounds: Normal breath sounds.  Abdominal:     Palpations: Abdomen is soft.     Tenderness: There is no abdominal tenderness.  Musculoskeletal:        General: No swelling.     Cervical back: Normal range of motion  and neck supple.  Skin:    General: Skin is warm and dry.     Capillary Refill: Capillary refill takes less than 2 seconds.  Neurological:     General: No focal deficit present.     Mental Status: She is alert and oriented to person, place, and time.     Cranial Nerves: No cranial nerve deficit.     Sensory: No sensory deficit.     Motor: No weakness.     Coordination: Coordination normal.     Comments: Normal strength and sensation  Psychiatric:        Mood and Affect: Mood normal.     (all labs ordered are listed, but only abnormal results are displayed) Labs Reviewed  BASIC METABOLIC PANEL WITH GFR - Abnormal; Notable for the following components:      Result Value   CO2 20 (*)    Glucose, Bld 143 (*)    Anion gap 18 (*)    All other  components within normal limits  CBC - Abnormal; Notable for the following components:   WBC 13.1 (*)    All other components within normal limits  RESP PANEL BY RT-PCR (RSV, FLU A&B, COVID)  [id number]  TROPONIN T, HIGH SENSITIVITY    EKG: EKG Interpretation Date/Time:  Wednesday January 27 2024 15:17:00 EDT Ventricular Rate:  94 PR Interval:  155 QRS Duration:  80 QT Interval:  365 QTC Calculation: 457 R Axis:   54  Text Interpretation: Sinus rhythm Probable left atrial enlargement Confirmed by Ruthe Cornet [phone]) on 01/27/2024 3:20:16 PM  Radiology: [facility] Chest 2 View Result Date: 01/27/2024 CLINICAL DATA:  Chest pain EXAM: CHEST - 2 VIEW COMPARISON:  Chest x-ray 03/21/2023 FINDINGS: The heart size and mediastinal contours are within normal limits. Both lungs are clear. The visualized skeletal structures are unremarkable. IMPRESSION: No active cardiopulmonary disease. Electronically Signed   By: Greig Pique M.D.   On: 01/27/2024 15:46     Procedures   Medications Ordered in the ED  ibuprofen  (ADVIL ) tablet 400 mg (400 mg Oral Given 01/27/24 1525)  ondansetron  (ZOFRAN -ODT) disintegrating tablet 4 mg (4 mg Oral Given 01/27/24 1524)  ketorolac  (TORADOL ) 15 MG/ML injection 15 mg (15 mg Intravenous Given 01/27/24 1656)  ondansetron  (ZOFRAN ) injection 4 mg (4 mg Intravenous Given 01/27/24 1656)                                    Medical Decision Making Amount and/or Complexity of Data Reviewed Labs: ordered. Radiology: ordered.  Risk Prescription drug management.   Lisa Nash  is here with bodyaches after flu shot yesterday.  Unremarkable vitals here.  No fever.  She has got myalgias throughout.  Have no concern for meningitis heart attack PE or other major acute process but she did get EKG blood work troponin and chest x-ray in triage which were unremarkable.  EKG shows sinus rhythm.  No ischemic changes.  Troponin normal.  Chest x-ray shows no evidence of pneumonia  or pneumothorax.  White count was 13 but otherwise lab work was unremarkable.  She felt better after Toradol  Zofran  Motrin  in the ED.  I do not have any major concern for other acute process.  She not having any abdominal pain.  Her viral panel here was negative.  Is possible that maybe she has some other virus but I do suspect that her symptoms are secondary to the flu shot.  Recommend supportive care at home and told to return if she develop worsening symptoms.  She understands return precautions.  Discharged in good condition.  Neurologically intact on exam and overall no PE risk factors or other ACS risk factors.  Discharge.  This chart was dictated using voice recognition software.  Despite best efforts to proofread,  errors can occur which can change the documentation meaning.      Final diagnoses:  Myalgia    ED Discharge Orders          Ordered    ondansetron  (ZOFRAN -ODT) 4 MG disintegrating tablet  Every 8 hours PRN        01/27/24 1650               Gertrue Willette, Juliene, DO 01/27/24 1707

## 2024-01-27 NOTE — Discharge Instructions (Signed)
 Recommend 1000 mg of Tylenol  every 6 hours as needed for pain, recommend 800 mg ibuprofen  every 8 hours as needed for pain take Zofran  as needed for nausea and vomiting

## 2024-01-27 NOTE — ED Triage Notes (Signed)
 Pt POV reporting L side chest pain, congestion, and gen body aches following flu shot yesterday.

## 2024-02-04 ENCOUNTER — Encounter: Payer: Self-pay | Admitting: Internal Medicine

## 2024-02-08 ENCOUNTER — Encounter: Payer: Self-pay | Admitting: Internal Medicine

## 2024-03-23 ENCOUNTER — Encounter: Payer: Self-pay | Admitting: Internal Medicine

## 2024-04-05 ENCOUNTER — Encounter: Payer: Self-pay | Admitting: Internal Medicine

## 2024-04-05 ENCOUNTER — Ambulatory Visit: Payer: Self-pay | Admitting: Internal Medicine

## 2024-04-05 VITALS — BP 108/80 | HR 88 | Temp 98.2°F | Ht 61.0 in | Wt 196.2 lb

## 2024-04-05 DIAGNOSIS — Z111 Encounter for screening for respiratory tuberculosis: Secondary | ICD-10-CM | POA: Diagnosis not present

## 2024-04-05 DIAGNOSIS — Z0000 Encounter for general adult medical examination without abnormal findings: Secondary | ICD-10-CM

## 2024-04-05 DIAGNOSIS — I10 Essential (primary) hypertension: Secondary | ICD-10-CM

## 2024-04-05 DIAGNOSIS — R7689 Other specified abnormal immunological findings in serum: Secondary | ICD-10-CM | POA: Insufficient documentation

## 2024-04-05 DIAGNOSIS — R7309 Other abnormal glucose: Secondary | ICD-10-CM | POA: Diagnosis not present

## 2024-04-05 DIAGNOSIS — H6121 Impacted cerumen, right ear: Secondary | ICD-10-CM

## 2024-04-05 DIAGNOSIS — R0683 Snoring: Secondary | ICD-10-CM | POA: Insufficient documentation

## 2024-04-05 LAB — POCT URINALYSIS DIPSTICK
Bilirubin, UA: NEGATIVE
Blood, UA: NEGATIVE
Glucose, UA: NEGATIVE
Ketones, UA: NEGATIVE
Leukocytes, UA: NEGATIVE
Nitrite, UA: NEGATIVE
Protein, UA: NEGATIVE
Spec Grav, UA: 1.02 (ref 1.010–1.025)
Urobilinogen, UA: 0.2 U/dL
pH, UA: 5.5 (ref 5.0–8.0)

## 2024-04-05 NOTE — Assessment & Plan Note (Signed)
 Suspected sleep apnea due to snoring and potential apnea during surgery. Discussed risks of untreated sleep apnea. - Referred to [facility] for sleep study evaluation.

## 2024-04-05 NOTE — Assessment & Plan Note (Signed)
 Chronic, fair control. Goal BP<120/80.  She will continue with hydrochlorothiazide  12.5mg  daily. Encouraged to follow low sodium diet. Encouraged to also incorporate more exercise into her daily routine.  She will f/u in six months for re-evaluation.

## 2024-04-05 NOTE — Patient Instructions (Signed)

## 2024-04-05 NOTE — Assessment & Plan Note (Addendum)
 After obtaining verbal consent, right ear was flushed by irrigation. No TM abnormalities were noted. She tolerated procedure well without any complications.

## 2024-04-05 NOTE — Assessment & Plan Note (Signed)
 Previous labs reviewed, her A1c has been elevated in the past. I will check an A1c today. Reminded to avoid refined sugars including sugary drinks/foods and processed meats including bacon, sausages and deli meats.

## 2024-04-05 NOTE — Progress Notes (Signed)
 I,Victoria T Emmitt, CMA,acting as a neurosurgeon for Catheryn LOISE Slocumb, MD.,have documented all relevant documentation on the behalf of Catheryn LOISE Slocumb, MD,as directed by  Catheryn LOISE Slocumb, MD while in the presence of Catheryn LOISE Slocumb, MD.  Subjective:    Patient ID: Lisa Nash  , female    DOB: 1970/12/08 , 53 y.o.   MRN: [id number]  Chief Complaint  Patient presents with   Annual Exam    The patient is here today for a physical examination.  She has her pelvic exams performed by Gyn at [facility]. She reports compliance with meds. She denies headaches, chest pain and shortness of breath.    Hypertension    HPI Discussed the use of AI scribe software for clinical note transcription with the patient, who gave verbal consent to proceed.  History of Present Illness Lisa Nash  is a 53 year old female who presents for a routine physical exam and blood pressure check.  Her appointment was rescheduled from November to December. She requires a TB test as her previous Quantiferon test expired on November 26th.  She has a history of hypertension and reports no chest pain or shortness of breath during physical activity. She smokes one pack of cigarettes per day.  She underwent a partial hysterectomy due to fibroids, which resulted in a prolonged surgery and an overnight hospital stay. During the procedure, she experienced sleep apnea, but no follow-up sleep study was conducted. She reports snoring, as noted by her youngest son, and sometimes feels tired. She has not been exercising as much recently due to the cold weather.  She drinks a lot of water but occasionally consumes sodas. She reports normal bowel movements and no issues with her current GYN care. She still has her ovaries and is not undergoing regular pelvic exams unless needed.  She experiences pain and swelling in her cervical area, which she attributes to her sleeping position. She attempted to see a rheumatologist but was told she  did not accept her insurance.  She wakes up once at night to use the restroom and sometimes feels rested in the morning. She occasionally falls asleep while reading or watching TV but does not fall asleep at red lights.   Hypertension This is a chronic problem. The current episode started more than 1 year ago. The problem has been gradually improving since onset. The problem is controlled. Pertinent negatives include no blurred vision, chest pain, palpitations or shortness of breath. The current treatment provides moderate improvement. Compliance problems include exercise.      Past Medical History:  Diagnosis Date   Arthritis    Asthma    Eczema      Family History  Problem Relation Age of Onset   Hypertension Mother    Hypertension Father    Arthritis Father    Breast cancer Neg Hx      Current Outpatient Medications:    albuterol  (VENTOLIN  HFA) 108 (90 Base) MCG/ACT inhaler, Inhale 2 puffs into the lungs every 6 (six) hours as needed for wheezing or shortness of breath., Disp: 18 g, Rfl: 1   clobetasol (TEMOVATE) 0.05 % external solution, Apply topically., Disp: , Rfl:    clobetasol ointment (TEMOVATE) 0.05 %, Apply to scalp 3-4 times per week. Not to face., Disp: , Rfl:    EPINEPHrine  0.3 mg/0.3 mL IJ SOAJ injection, Inject 0.3 mg into the muscle as needed for anaphylaxis., Disp: 1 each, Rfl: 0   hydrochlorothiazide  (MICROZIDE ) 12.5 MG capsule, TAKE 1 CAPSULE(12.5  MG) BY MOUTH DAILY, Disp: 90 capsule, Rfl: 2   levocetirizine (XYZAL ) 5 MG tablet, Take 1 tablet (5 mg total) by mouth every evening., Disp: 90 tablet, Rfl: 2   minoxidil (LONITEN) 2.5 MG tablet, Take 1/2 tab by mouth daily, Disp: , Rfl:    montelukast  (SINGULAIR ) 10 MG tablet, TAKE 1 TABLET BY MOUTH EVERY DAY IN THE EVENING, Disp: 90 tablet, Rfl: 3   ondansetron  (ZOFRAN -ODT) 4 MG disintegrating tablet, Take 1 tablet (4 mg total) by mouth every 8 (eight) hours as needed., Disp: 20 tablet, Rfl: 0   Allergies   Allergen Reactions   Bee Pollen Anaphylaxis   Adhesive [Tape]    Haemophilus Influenzae Vaccines Other (See Comments)    Bodyaches and severe headaches overall did not feel well after receiving vaccine.   Pollen Extract       The patient states she uses status post hysterectomy for birth control. No LMP recorded (lmp unknown). Patient has had a hysterectomy.. Negative for Dysmenorrhea. Negative for: breast discharge, breast lump(s), breast pain and breast self exam. Associated symptoms include abnormal vaginal bleeding. Pertinent negatives include abnormal bleeding (hematology), anxiety, decreased libido, depression, difficulty falling sleep, dyspareunia, history of infertility, nocturia, sexual dysfunction, sleep disturbances, urinary incontinence, urinary urgency, vaginal discharge and vaginal itching. Diet regular.The patient states her exercise level is  intermittent.  . The patient's tobacco use is:  Social History   Tobacco Use  Smoking Status Some Days   Current packs/day: 0.00   Average packs/day: 1 pack/day for 15.2 years (15.2 ttl pk-yrs)   Types: Cigarettes   Start date: 70   Last attempt to quit: 07/09/2005   Years since quitting: 18.7  Smokeless Tobacco Never  Tobacco Comments   Started smoking 1992, only here and there. In 2002- 1ppd x 5 years, Quit in 2007, started back in 2014. 1ppd x 5 years, 1/2ppd 2014x5 years,  smokes 1ppd x 9 years(greater than 20packyear history)  . She has been exposed to passive smoke. The patient's alcohol use is:  Social History   Substance and Sexual Activity  Alcohol Use Yes   Alcohol/week: 3.0 - 4.0 standard drinks of alcohol   Types: 1 Glasses of wine, 2 - 3 Cans of beer per week   Comment: drinks beer 2-3 times per week, wine occassionally    Review of Systems  Constitutional: Negative.   HENT: Negative.    Eyes: Negative.  Negative for blurred vision.  Respiratory: Negative.  Negative for shortness of breath.   Cardiovascular:  Negative.  Negative for chest pain and palpitations.  Gastrointestinal: Negative.   Endocrine: Negative.   Genitourinary: Negative.   Musculoskeletal: Negative.   Skin: Negative.   Allergic/Immunologic: Negative.   Neurological: Negative.   Hematological: Negative.   Psychiatric/Behavioral: Negative.       Today's Vitals   04/05/24 0849  BP: 108/80  Pulse: 88  Temp: 98.2 F (36.8 C)  SpO2: 98%  Weight: 196 lb 3.2 oz (89 kg)  Height: 5' 1 (1.549 m)   Body mass index is 37.07 kg/m.  Wt Readings from Last 3 Encounters:  04/05/24 196 lb 3.2 oz (89 kg)  01/27/24 199 lb (90.3 kg)  01/26/24 204 lb (92.5 kg)     Objective:  Physical Exam Vitals and nursing note reviewed.  Constitutional:      Appearance: Normal appearance.  HENT:     Head: Normocephalic and atraumatic.     Right Ear: Tympanic membrane, ear canal and external ear normal.  Left Ear: Tympanic membrane, ear canal and external ear normal.     Nose: Nose normal.     Mouth/Throat:     Mouth: Mucous membranes are moist.     Pharynx: Oropharynx is clear.  Eyes:     Extraocular Movements: Extraocular movements intact.     Conjunctiva/sclera: Conjunctivae normal.     Pupils: Pupils are equal, round, and reactive to light.  Neck:     Thyroid : Thyromegaly present.  Cardiovascular:     Rate and Rhythm: Normal rate and regular rhythm.     Pulses: Normal pulses.     Heart sounds: Normal heart sounds.  Pulmonary:     Effort: Pulmonary effort is normal.     Breath sounds: Normal breath sounds.  Chest:  Breasts:    Tanner Score is 5.     Right: Normal.     Left: Normal.  Abdominal:     General: Bowel sounds are normal.     Palpations: Abdomen is soft.     Comments: Rounded, soft. Difficult to assess organomegaly  Genitourinary:    Comments: deferred Musculoskeletal:        General: Normal range of motion.     Cervical back: Normal range of motion and neck supple.  Skin:    General: Skin is warm and  dry.  Neurological:     General: No focal deficit present.     Mental Status: She is alert and oriented to person, place, and time.  Psychiatric:        Mood and Affect: Mood normal.        Behavior: Behavior normal.         Assessment And Plan:     Annual physical exam Assessment & Plan: A full exam was performed.  Importance of monthly self breast exams was discussed with the patient.  She is advised to get 30-45 minutes of regular exercise, no less than four to five days per week. Both weight-bearing and aerobic exercises are recommended.  She is advised to follow a healthy diet with at least six fruits/veggies per day, decrease intake of red meat and other saturated fats and to increase fish intake to twice weekly.  Meats/fish should not be fried -- baked, boiled or broiled is preferable. It is also important to cut back on your sugar intake.  Be sure to read labels - try to avoid anything with added sugar, high fructose corn syrup or other sweeteners.  If you must use a sweetener, you can try stevia or monkfruit.  It is also important to avoid artificially sweetened foods/beverages and diet drinks. Lastly, wear SPF 50 sunscreen on exposed skin and when in direct sunlight for an extended period of time.  Be sure to avoid fast food restaurants and aim for at least 60 ounces of water daily.      Orders: -     CMP14+EGFR -     Lipid panel -     CBC with Differential/Platelet -     TSH  Essential hypertension, benign Assessment & Plan: Chronic, fair control. Goal BP<120/80.  She will continue with hydrochlorothiazide  12.5mg  daily. Encouraged to follow low sodium diet. Encouraged to also incorporate more exercise into her daily routine.  She will f/u in six months for re-evaluation.   Orders: -     POCT urinalysis dipstick -     Microalbumin / creatinine urine ratio  Impacted cerumen of right ear Assessment & Plan: After obtaining verbal consent, right ear was flushed  by irrigation.  No TM abnormalities were noted. She tolerated procedure well without any complications.     Orders: -     Ear Lavage  Snoring Assessment & Plan: Suspected sleep apnea due to snoring and potential apnea during surgery. Discussed risks of untreated sleep apnea. - Referred to [facility] for sleep study evaluation.  Orders: -     Ambulatory referral to Neurology  Positive ANA (antinuclear antibody) Assessment & Plan: Previous abnormal tests suggest possible inflammatory or rheumatologic disorder. Abnormal findings of erosive arthritis seen on LDCT.  Referral delayed due to insurance issues. - Referred to rheumatologist at [facility] for further evaluation.  Orders: -     Ambulatory referral to Rheumatology  Other abnormal glucose Assessment & Plan: Previous labs reviewed, her A1c has been elevated in the past. I will check an A1c today. Reminded to avoid refined sugars including sugary drinks/foods and processed meats including bacon, sausages and deli meats.    Orders: -     Hemoglobin A1c  Screening-pulmonary TB -     QuantiFERON-TB Gold Plus   Return in 6 months (on 10/04/2024), or bp check, for 1 year HM, . Patient was given opportunity to ask questions. Patient verbalized understanding of the plan and was able to repeat key elements of the plan. All questions were answered to their satisfaction.   I, Catheryn LOISE Slocumb, MD, have reviewed all documentation for this visit. The documentation on 04/05/24 for the exam, diagnosis, procedures, and orders are all accurate and complete.

## 2024-04-05 NOTE — Assessment & Plan Note (Signed)

## 2024-04-05 NOTE — Assessment & Plan Note (Signed)
 Previous abnormal tests suggest possible inflammatory or rheumatologic disorder. Abnormal findings of erosive arthritis seen on LDCT.  Referral delayed due to insurance issues. - Referred to rheumatologist at [facility] for further evaluation.

## 2024-04-07 LAB — CBC WITH DIFFERENTIAL/PLATELET
Basophils Absolute: 0 x10E3/uL (ref 0.0–0.2)
Basos: 1 %
EOS (ABSOLUTE): 0.1 x10E3/uL (ref 0.0–0.4)
Eos: 2 %
Hematocrit: 41.2 % (ref 34.0–46.6)
Hemoglobin: 13.2 g/dL (ref 11.1–15.9)
Immature Grans (Abs): 0 x10E3/uL (ref 0.0–0.1)
Immature Granulocytes: 0 %
Lymphocytes Absolute: 2.8 x10E3/uL (ref 0.7–3.1)
Lymphs: 31 %
MCH: 29.5 pg (ref 26.6–33.0)
MCHC: 32 g/dL (ref 31.5–35.7)
MCV: 92 fL (ref 79–97)
Monocytes Absolute: 0.5 x10E3/uL (ref 0.1–0.9)
Monocytes: 6 %
Neutrophils Absolute: 5.4 x10E3/uL (ref 1.4–7.0)
Neutrophils: 60 %
Platelets: 350 x10E3/uL (ref 150–450)
RBC: 4.47 x10E6/uL (ref 3.77–5.28)
RDW: 14 % (ref 11.7–15.4)
WBC: 8.9 x10E3/uL (ref 3.4–10.8)

## 2024-04-07 LAB — CMP14+EGFR
ALT: 25 IU/L (ref 0–32)
AST: 17 IU/L (ref 0–40)
Albumin: 4.4 g/dL (ref 3.8–4.9)
Alkaline Phosphatase: 92 IU/L (ref 49–135)
BUN/Creatinine Ratio: 17 (ref 9–23)
BUN: 12 mg/dL (ref 6–24)
Bilirubin Total: 0.3 mg/dL (ref 0.0–1.2)
CO2: 21 mmol/L (ref 20–29)
Calcium: 9.3 mg/dL (ref 8.7–10.2)
Chloride: 102 mmol/L (ref 96–106)
Creatinine, Ser: 0.72 mg/dL (ref 0.57–1.00)
Globulin, Total: 2.9 g/dL (ref 1.5–4.5)
Glucose: 79 mg/dL (ref 70–99)
Potassium: 4 mmol/L (ref 3.5–5.2)
Sodium: 140 mmol/L (ref 134–144)
Total Protein: 7.3 g/dL (ref 6.0–8.5)
eGFR: 100 mL/min/1.73 (ref >59)

## 2024-04-07 LAB — LIPID PANEL
Chol/HDL Ratio: 4.4 ratio (ref 0.0–4.4)
Cholesterol, Total: 193 mg/dL (ref 100–199)
HDL: 44 mg/dL (ref >39)
LDL Chol Calc (NIH): 134 mg/dL — ABNORMAL HIGH (ref 0–99)
Triglycerides: 84 mg/dL (ref 0–149)
VLDL Cholesterol Cal: 15 mg/dL (ref 5–40)

## 2024-04-07 LAB — HEMOGLOBIN A1C
Est. average glucose Bld gHb Est-mCnc: 120 mg/dL
Hgb A1c MFr Bld: 5.8 % — ABNORMAL HIGH (ref 4.8–5.6)

## 2024-04-07 LAB — QUANTIFERON-TB GOLD PLUS
QuantiFERON Mitogen Value: >10 [IU]/mL
QuantiFERON Nil Value: 0.01 [IU]/mL
QuantiFERON TB1 Ag Value: 0.02 [IU]/mL
QuantiFERON TB2 Ag Value: 0.02 [IU]/mL
QuantiFERON-TB Gold Plus: NEGATIVE

## 2024-04-07 LAB — TSH: TSH: 1.53 u[IU]/mL (ref 0.450–4.500)

## 2024-04-07 LAB — MICROALBUMIN / CREATININE URINE RATIO
Creatinine, Urine: 66.5 mg/dL
Microalb/Creat Ratio: <5 mg/g{creat} (ref 0–29)
Microalbumin, Urine: <3 ug/mL

## 2024-04-19 ENCOUNTER — Encounter: Payer: Self-pay | Admitting: Internal Medicine

## 2024-05-23 ENCOUNTER — Encounter: Payer: Self-pay | Admitting: Internal Medicine

## 2024-05-31 ENCOUNTER — Encounter: Payer: Self-pay | Admitting: Neurology

## 2024-05-31 ENCOUNTER — Ambulatory Visit: Admitting: Neurology

## 2024-05-31 ENCOUNTER — Institutional Professional Consult (permissible substitution): Admitting: Neurology

## 2024-05-31 VITALS — BP 129/85 | HR 83 | Ht 61.0 in | Wt 192.2 lb

## 2024-05-31 DIAGNOSIS — F17200 Nicotine dependence, unspecified, uncomplicated: Secondary | ICD-10-CM | POA: Diagnosis not present

## 2024-05-31 DIAGNOSIS — G4730 Sleep apnea, unspecified: Secondary | ICD-10-CM | POA: Insufficient documentation

## 2024-05-31 DIAGNOSIS — F4310 Post-traumatic stress disorder, unspecified: Secondary | ICD-10-CM | POA: Diagnosis not present

## 2024-05-31 DIAGNOSIS — R7689 Other specified abnormal immunological findings in serum: Secondary | ICD-10-CM | POA: Diagnosis not present

## 2024-05-31 DIAGNOSIS — J4520 Mild intermittent asthma, uncomplicated: Secondary | ICD-10-CM | POA: Diagnosis not present

## 2024-05-31 MED ORDER — ALPRAZOLAM 0.5 MG PO TABS: 0.5000 mg | ORAL_TABLET | Freq: Every evening | ORAL | 0 refills | Status: AC | PRN

## 2024-05-31 MED ORDER — TRAZODONE HCL 50 MG PO TABS: 25.0000 mg | ORAL_TABLET | Freq: Every day | ORAL | 1 refills | Status: AC
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
## 2024-06-03 ENCOUNTER — Other Ambulatory Visit: Payer: Self-pay | Admitting: Internal Medicine

## 2024-06-10 ENCOUNTER — Telehealth: Payer: Self-pay | Admitting: Neurology

## 2024-06-10 NOTE — Telephone Encounter (Signed)
 Split [facility] Healthy blue pending

## 2024-07-19 ENCOUNTER — Ambulatory Visit: Admitting: Rheumatology

## 2024-08-30 ENCOUNTER — Ambulatory Visit: Admitting: Rheumatology

## 2024-10-05 ENCOUNTER — Ambulatory Visit: Admitting: Internal Medicine

## 2025-04-11 ENCOUNTER — Encounter: Admitting: Internal Medicine
